# Patient Record
Sex: Female | Born: 1985 | Race: Black or African American | Hispanic: No | Marital: Single | Smoking: Never smoker
Health system: Southern US, Community
[De-identification: ages and names within clinical notes are randomized; demographics above are authoritative.]

## PROBLEM LIST (undated history)

## (undated) ENCOUNTER — Inpatient Hospital Stay (HOSPITAL_COMMUNITY): Payer: Self-pay

## (undated) ENCOUNTER — Inpatient Hospital Stay (HOSPITAL_COMMUNITY): Payer: Managed Care, Other (non HMO)

## (undated) DIAGNOSIS — A630 Anogenital (venereal) warts: Secondary | ICD-10-CM

## (undated) DIAGNOSIS — N920 Excessive and frequent menstruation with regular cycle: Secondary | ICD-10-CM

## (undated) DIAGNOSIS — N879 Dysplasia of cervix uteri, unspecified: Secondary | ICD-10-CM

## (undated) DIAGNOSIS — F329 Major depressive disorder, single episode, unspecified: Secondary | ICD-10-CM

## (undated) DIAGNOSIS — F410 Panic disorder [episodic paroxysmal anxiety] without agoraphobia: Secondary | ICD-10-CM

## (undated) DIAGNOSIS — F32A Depression, unspecified: Secondary | ICD-10-CM

## (undated) DIAGNOSIS — D649 Anemia, unspecified: Secondary | ICD-10-CM

## (undated) DIAGNOSIS — R87629 Unspecified abnormal cytological findings in specimens from vagina: Secondary | ICD-10-CM

## (undated) DIAGNOSIS — F419 Anxiety disorder, unspecified: Secondary | ICD-10-CM

## (undated) HISTORY — DX: Depression, unspecified: F32.A

## (undated) HISTORY — DX: Anxiety disorder, unspecified: F41.9

## (undated) HISTORY — PX: DENTAL SURGERY: SHX609

## (undated) HISTORY — DX: Unspecified abnormal cytological findings in specimens from vagina: R87.629

## (undated) HISTORY — DX: Excessive and frequent menstruation with regular cycle: N92.0

## (undated) HISTORY — PX: CERVICAL BIOPSY  W/ LOOP ELECTRODE EXCISION: SUR135

## (undated) HISTORY — DX: Major depressive disorder, single episode, unspecified: F32.9

## (undated) HISTORY — DX: Panic disorder (episodic paroxysmal anxiety): F41.0

## (undated) HISTORY — DX: Anogenital (venereal) warts: A63.0

## (undated) HISTORY — DX: Dysplasia of cervix uteri, unspecified: N87.9

---
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
## 2007-01-11 ENCOUNTER — Inpatient Hospital Stay (HOSPITAL_COMMUNITY): Admission: AD | Admit: 2007-01-11 | Discharge: 2007-01-11 | Payer: Self-pay | Admitting: Obstetrics & Gynecology

## 2007-03-02 ENCOUNTER — Inpatient Hospital Stay (HOSPITAL_COMMUNITY): Admission: AD | Admit: 2007-03-02 | Discharge: 2007-03-02 | Payer: Self-pay | Admitting: Obstetrics

## 2007-04-24 ENCOUNTER — Ambulatory Visit (HOSPITAL_COMMUNITY): Admission: RE | Admit: 2007-04-24 | Discharge: 2007-04-24 | Payer: Self-pay | Admitting: Obstetrics

## 2007-05-23 ENCOUNTER — Inpatient Hospital Stay (HOSPITAL_COMMUNITY): Admission: AD | Admit: 2007-05-23 | Discharge: 2007-05-23 | Payer: Self-pay | Admitting: Obstetrics & Gynecology

## 2007-05-24 ENCOUNTER — Inpatient Hospital Stay (HOSPITAL_COMMUNITY): Admission: AD | Admit: 2007-05-24 | Discharge: 2007-05-26 | Payer: Self-pay | Admitting: Obstetrics & Gynecology

## 2007-09-16 ENCOUNTER — Emergency Department (HOSPITAL_COMMUNITY): Admission: EM | Admit: 2007-09-16 | Discharge: 2007-09-16 | Payer: Self-pay | Admitting: Emergency Medicine

## 2007-10-10 ENCOUNTER — Emergency Department (HOSPITAL_COMMUNITY): Admission: EM | Admit: 2007-10-10 | Discharge: 2007-10-10 | Payer: Self-pay | Admitting: Emergency Medicine

## 2007-11-05 ENCOUNTER — Emergency Department (HOSPITAL_COMMUNITY): Admission: EM | Admit: 2007-11-05 | Discharge: 2007-11-05 | Payer: Self-pay | Admitting: Family Medicine

## 2007-11-18 ENCOUNTER — Emergency Department (HOSPITAL_COMMUNITY): Admission: EM | Admit: 2007-11-18 | Discharge: 2007-11-18 | Payer: Self-pay | Admitting: Emergency Medicine

## 2008-01-18 ENCOUNTER — Inpatient Hospital Stay (HOSPITAL_COMMUNITY): Admission: AD | Admit: 2008-01-18 | Discharge: 2008-01-18 | Payer: Self-pay | Admitting: Obstetrics

## 2008-11-15 ENCOUNTER — Emergency Department (HOSPITAL_COMMUNITY): Admission: EM | Admit: 2008-11-15 | Discharge: 2008-11-15 | Payer: Self-pay | Admitting: Emergency Medicine

## 2008-11-25 ENCOUNTER — Emergency Department (HOSPITAL_COMMUNITY): Admission: EM | Admit: 2008-11-25 | Discharge: 2008-11-25 | Payer: Self-pay | Admitting: Family Medicine

## 2009-02-14 ENCOUNTER — Inpatient Hospital Stay (HOSPITAL_COMMUNITY): Admission: AD | Admit: 2009-02-14 | Discharge: 2009-02-14 | Payer: Self-pay | Admitting: Obstetrics & Gynecology

## 2009-03-10 ENCOUNTER — Emergency Department (HOSPITAL_COMMUNITY): Admission: EM | Admit: 2009-03-10 | Discharge: 2009-03-10 | Payer: Self-pay | Admitting: Family Medicine

## 2009-03-17 ENCOUNTER — Inpatient Hospital Stay (HOSPITAL_COMMUNITY): Admission: AD | Admit: 2009-03-17 | Discharge: 2009-03-17 | Payer: Self-pay | Admitting: Obstetrics & Gynecology

## 2009-03-17 ENCOUNTER — Ambulatory Visit: Payer: Self-pay | Admitting: Advanced Practice Midwife

## 2009-04-22 ENCOUNTER — Emergency Department (HOSPITAL_COMMUNITY): Admission: EM | Admit: 2009-04-22 | Discharge: 2009-04-22 | Payer: Self-pay | Admitting: Family Medicine

## 2009-10-07 ENCOUNTER — Emergency Department (HOSPITAL_COMMUNITY): Admission: EM | Admit: 2009-10-07 | Discharge: 2009-10-07 | Payer: Self-pay | Admitting: Emergency Medicine

## 2010-10-05 ENCOUNTER — Emergency Department (HOSPITAL_COMMUNITY)
Admission: EM | Admit: 2010-10-05 | Discharge: 2010-10-05 | Payer: Self-pay | Source: Home / Self Care | Admitting: Family Medicine

## 2010-10-31 ENCOUNTER — Encounter: Payer: Self-pay | Admitting: Obstetrics

## 2010-11-17 ENCOUNTER — Inpatient Hospital Stay (HOSPITAL_COMMUNITY): Payer: Medicare HMO

## 2010-11-17 ENCOUNTER — Inpatient Hospital Stay (HOSPITAL_COMMUNITY)
Admission: AD | Admit: 2010-11-17 | Discharge: 2010-11-17 | Disposition: A | Discharge status: Home / Self Care | Payer: Medicare HMO | Source: Ambulatory Visit | Attending: Obstetrics & Gynecology | Admitting: Obstetrics & Gynecology

## 2010-11-17 DIAGNOSIS — O210 Mild hyperemesis gravidarum: Secondary | ICD-10-CM | POA: Insufficient documentation

## 2010-11-17 LAB — URINALYSIS, ROUTINE W REFLEX MICROSCOPIC
Specific Gravity, Urine: 1.025 (ref 1.005–1.030)
Urine Glucose, Fasting: NEGATIVE mg/dL
pH: 6 (ref 5.0–8.0)

## 2010-11-17 LAB — POCT PREGNANCY, URINE: Preg Test, Ur: POSITIVE

## 2010-12-18 ENCOUNTER — Inpatient Hospital Stay (HOSPITAL_COMMUNITY)
Admission: AD | Admit: 2010-12-18 | Discharge: 2010-12-18 | Disposition: A | Discharge status: Home / Self Care | Payer: No Typology Code available for payment source | Source: Ambulatory Visit | Attending: Obstetrics | Admitting: Obstetrics

## 2010-12-18 ENCOUNTER — Inpatient Hospital Stay (HOSPITAL_COMMUNITY): Payer: No Typology Code available for payment source

## 2010-12-18 DIAGNOSIS — R109 Unspecified abdominal pain: Secondary | ICD-10-CM | POA: Diagnosis not present

## 2010-12-18 DIAGNOSIS — O99891 Other specified diseases and conditions complicating pregnancy: Secondary | ICD-10-CM | POA: Insufficient documentation

## 2010-12-18 DIAGNOSIS — O9989 Other specified diseases and conditions complicating pregnancy, childbirth and the puerperium: Secondary | ICD-10-CM

## 2010-12-18 DIAGNOSIS — W010XXA Fall on same level from slipping, tripping and stumbling without subsequent striking against object, initial encounter: Secondary | ICD-10-CM | POA: Insufficient documentation

## 2011-01-17 LAB — GC/CHLAMYDIA PROBE AMP, GENITAL
Chlamydia, DNA Probe: NEGATIVE
GC Probe Amp, Genital: NEGATIVE

## 2011-01-17 LAB — CBC
HCT: 33.4 % — ABNORMAL LOW (ref 36.0–46.0)
Hemoglobin: 11.2 g/dL — ABNORMAL LOW (ref 12.0–15.0)
MCHC: 33.5 g/dL (ref 30.0–36.0)
MCV: 80.3 fL (ref 78.0–100.0)
Platelets: 309 10*3/uL (ref 150–400)
RBC: 4.15 MIL/uL (ref 3.87–5.11)
RDW: 17.1 % — ABNORMAL HIGH (ref 11.5–15.5)
WBC: 5.9 10*3/uL (ref 4.0–10.5)

## 2011-01-17 LAB — WET PREP, GENITAL
Clue Cells Wet Prep HPF POC: NONE SEEN
Trich, Wet Prep: NONE SEEN
Yeast Wet Prep HPF POC: NONE SEEN

## 2011-01-17 LAB — POCT PREGNANCY, URINE: Preg Test, Ur: NEGATIVE

## 2011-07-05 LAB — WET PREP, GENITAL

## 2011-07-05 LAB — URINALYSIS, ROUTINE W REFLEX MICROSCOPIC
Bilirubin Urine: NEGATIVE
Hgb urine dipstick: NEGATIVE
Nitrite: NEGATIVE
Protein, ur: NEGATIVE

## 2011-07-05 LAB — POCT PREGNANCY, URINE: Preg Test, Ur: NEGATIVE

## 2011-07-05 LAB — GC/CHLAMYDIA PROBE AMP, GENITAL: Chlamydia, DNA Probe: NEGATIVE

## 2011-07-15 LAB — URINALYSIS, ROUTINE W REFLEX MICROSCOPIC
Glucose, UA: NEGATIVE
Hgb urine dipstick: NEGATIVE
Ketones, ur: NEGATIVE
Nitrite: NEGATIVE
Protein, ur: NEGATIVE
Specific Gravity, Urine: 1.03

## 2011-07-22 LAB — CBC
Hemoglobin: 7.7 — CL
MCV: 75.8 — ABNORMAL LOW
RBC: 3.12 — ABNORMAL LOW
WBC: 13.4 — ABNORMAL HIGH

## 2011-07-22 LAB — RH IMMUNE GLOB WKUP(>/=20WKS)(NOT WOMEN'S HOSP): Fetal Screen: NEGATIVE

## 2011-07-25 LAB — CBC
MCHC: 31.9
RDW: 17.1 — ABNORMAL HIGH

## 2011-07-25 LAB — RPR: RPR Ser Ql: NONREACTIVE

## 2011-09-18 ENCOUNTER — Inpatient Hospital Stay (HOSPITAL_COMMUNITY): Payer: Managed Care, Other (non HMO)

## 2011-09-18 ENCOUNTER — Inpatient Hospital Stay (HOSPITAL_COMMUNITY)
Admission: AD | Admit: 2011-09-18 | Discharge: 2011-09-18 | Disposition: A | Discharge status: Home / Self Care | Payer: Managed Care, Other (non HMO) | Source: Ambulatory Visit | Attending: Obstetrics | Admitting: Obstetrics

## 2011-09-18 ENCOUNTER — Encounter (HOSPITAL_COMMUNITY): Payer: Self-pay

## 2011-09-18 DIAGNOSIS — O26899 Other specified pregnancy related conditions, unspecified trimester: Secondary | ICD-10-CM

## 2011-09-18 DIAGNOSIS — N949 Unspecified condition associated with female genital organs and menstrual cycle: Secondary | ICD-10-CM | POA: Diagnosis not present

## 2011-09-18 DIAGNOSIS — O99891 Other specified diseases and conditions complicating pregnancy: Secondary | ICD-10-CM | POA: Insufficient documentation

## 2011-09-18 DIAGNOSIS — R109 Unspecified abdominal pain: Secondary | ICD-10-CM | POA: Insufficient documentation

## 2011-09-18 LAB — URINALYSIS, ROUTINE W REFLEX MICROSCOPIC
Ketones, ur: NEGATIVE mg/dL
Nitrite: NEGATIVE
Protein, ur: NEGATIVE mg/dL
Urobilinogen, UA: 0.2 mg/dL (ref 0.0–1.0)

## 2011-09-18 LAB — URINE MICROSCOPIC-ADD ON
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
## 2011-10-19 LAB — OB RESULTS CONSOLE GBS: GBS: NEGATIVE

## 2011-10-21 ENCOUNTER — Inpatient Hospital Stay (HOSPITAL_COMMUNITY): Payer: Managed Care, Other (non HMO)

## 2011-10-21 ENCOUNTER — Encounter (HOSPITAL_COMMUNITY): Payer: Self-pay | Admitting: *Deleted

## 2011-10-21 ENCOUNTER — Inpatient Hospital Stay (HOSPITAL_COMMUNITY)
Admission: AD | Admit: 2011-10-21 | Discharge: 2011-10-21 | Disposition: A | Discharge status: Home / Self Care | Payer: Managed Care, Other (non HMO) | Source: Ambulatory Visit | Attending: Obstetrics | Admitting: Obstetrics

## 2011-10-21 DIAGNOSIS — Z1389 Encounter for screening for other disorder: Secondary | ICD-10-CM

## 2011-10-21 DIAGNOSIS — O26899 Other specified pregnancy related conditions, unspecified trimester: Secondary | ICD-10-CM

## 2011-10-21 DIAGNOSIS — O30009 Twin pregnancy, unspecified number of placenta and unspecified number of amniotic sacs, unspecified trimester: Secondary | ICD-10-CM

## 2011-10-21 DIAGNOSIS — Y92009 Unspecified place in unspecified non-institutional (private) residence as the place of occurrence of the external cause: Secondary | ICD-10-CM | POA: Insufficient documentation

## 2011-10-21 DIAGNOSIS — O99891 Other specified diseases and conditions complicating pregnancy: Secondary | ICD-10-CM | POA: Insufficient documentation

## 2011-10-21 DIAGNOSIS — Z3689 Encounter for other specified antenatal screening: Secondary | ICD-10-CM

## 2011-10-21 DIAGNOSIS — W108XXA Fall (on) (from) other stairs and steps, initial encounter: Secondary | ICD-10-CM | POA: Insufficient documentation

## 2011-10-21 DIAGNOSIS — R109 Unspecified abdominal pain: Secondary | ICD-10-CM

## 2011-10-21 NOTE — Progress Notes (Signed)
Pt in c/o lower abdominal pain, states its "throbbing".  Fell down 4 stairs and fell on behind about a hour ago.  Denies any bleeding

## 2011-10-21 NOTE — ED Provider Notes (Signed)
History   Pt presents today c/o falling. She states she was picking up some toys and fell down 4-5 stairs. She fell on her buttocks and denies direct trauma to the abd. She denies vag dc or bleeding. She does reports lower abd pain and "soreness."   Chief Complaint  Patient presents with  . Abdominal Pain   HPI  OB History    Grav Para Term Preterm Abortions TAB SAB Ect Mult Living   2 1        1       Past Medical History  Diagnosis Date  . No pertinent past medical history     Past Surgical History  Procedure Date  . Dental surgery     Family History  Problem Relation Age of Onset  . Cancer Father     History  Substance Use Topics  . Smoking status: Never Smoker   . Smokeless tobacco: Not on file  . Alcohol Use: No    Allergies: No Known Allergies  Prescriptions prior to admission  Medication Sig Dispense Refill  . ibuprofen (ADVIL,MOTRIN) 200 MG tablet Take 200 mg by mouth every 6 (six) hours as needed.          Review of Systems  Constitutional: Negative for fever.  Eyes: Negative for blurred vision and double vision.  Cardiovascular: Negative for chest pain.  Gastrointestinal: Positive for abdominal pain. Negative for nausea, vomiting, diarrhea and constipation.  Genitourinary: Negative for dysuria, urgency, frequency and hematuria.  Neurological: Negative for dizziness and headaches.  Psychiatric/Behavioral: Negative for depression and suicidal ideas.   Physical Exam   Blood pressure 115/69, pulse 91, temperature 98.5 F (36.9 C), temperature source Oral, resp. rate 18, height 5\' 4"  (1.626 m), weight 152 lb (68.947 kg), last menstrual period 06/11/2011.  Physical Exam  Nursing note and vitals reviewed. Constitutional: She is oriented to person, place, and time. She appears well-developed and well-nourished. No distress.  HENT:  Head: Normocephalic and atraumatic.  Eyes: EOM are normal. Pupils are equal, round, and reactive to light.  GI: Soft. She  exhibits no distension. There is no tenderness. There is no rebound and no guarding.  Neurological: She is alert and oriented to person, place, and time.  Skin: Skin is warm and dry. She is not diaphoretic.  Psychiatric: She has a normal mood and affect. Her behavior is normal. Judgment and thought content normal.    MAU Course  Procedures  US shows single IUP with NL anatomy and no evidence of previa or abruption. NL cervical length of 4cm.  Assessment and Plan  Maternal fall: discussed with pt at length. She has f/u scheduled. Discussed diet, activity, risks, and precautions.  Clinton Gallant. Renatta Shrieves III, DrHSc, MPAS, PA-C  10/21/2011, 3:50 PM   Henrietta Hoover, PA 10/21/11 1554

## 2012-01-09 ENCOUNTER — Encounter (HOSPITAL_COMMUNITY): Payer: Self-pay | Admitting: *Deleted

## 2012-01-09 ENCOUNTER — Inpatient Hospital Stay (HOSPITAL_COMMUNITY)
Admission: AD | Admit: 2012-01-09 | Discharge: 2012-01-10 | Disposition: A | Discharge status: Hospice | Payer: Managed Care, Other (non HMO) | Source: Ambulatory Visit | Attending: Obstetrics and Gynecology | Admitting: Obstetrics and Gynecology

## 2012-01-09 ENCOUNTER — Inpatient Hospital Stay (HOSPITAL_COMMUNITY): Payer: Managed Care, Other (non HMO)

## 2012-01-09 DIAGNOSIS — M549 Dorsalgia, unspecified: Secondary | ICD-10-CM

## 2012-01-09 DIAGNOSIS — O99891 Other specified diseases and conditions complicating pregnancy: Secondary | ICD-10-CM | POA: Insufficient documentation

## 2012-01-09 DIAGNOSIS — R109 Unspecified abdominal pain: Secondary | ICD-10-CM | POA: Insufficient documentation

## 2012-01-09 DIAGNOSIS — O26899 Other specified pregnancy related conditions, unspecified trimester: Secondary | ICD-10-CM

## 2012-01-09 LAB — URINE MICROSCOPIC-ADD ON

## 2012-01-09 LAB — URINALYSIS, ROUTINE W REFLEX MICROSCOPIC
Glucose, UA: NEGATIVE mg/dL
Ketones, ur: 15 mg/dL — AB
Nitrite: NEGATIVE
Protein, ur: NEGATIVE mg/dL

## 2012-01-09 LAB — WET PREP, GENITAL
Clue Cells Wet Prep HPF POC: NONE SEEN
Trich, Wet Prep: NONE SEEN

## 2012-01-09 NOTE — MAU Note (Signed)
Pt reports off/on today has been having cramping/contractions and back pain. Tried tylenol without relief. Denies bleeding but reports increased discharge.

## 2012-01-09 NOTE — MAU Provider Note (Signed)
Chief Complaint:  Abdominal Cramping   None     HPI  Morgan Quinn is  26 y.o. G2P1 at [redacted]w[redacted]d presents with cramping and back pain x2 days.  She reports that the pain feels like menstrual cramps and is happening every few minutes.  She reports good fetal movement, denies LOF, vaginal bleeding, vaginal itching/burning, n/v, h/a, dizziness, urinary symptoms, or fever/chills.     Pregnancy Course: uncomplicated  Past Medical History: Past Medical History  Diagnosis Date  . No pertinent past medical history     Past Surgical History: Past Surgical History  Procedure Date  . Dental surgery     Family History: Family History  Problem Relation Age of Onset  . Cancer Father     Social History: History  Substance Use Topics  . Smoking status: Never Smoker   . Smokeless tobacco: Not on file  . Alcohol Use: No    Allergies: No Known Allergies  Meds:  Prescriptions prior to admission  Medication Sig Dispense Refill  . acetaminophen (TYLENOL) 325 MG suppository Place 650 mg rectally every 4 (four) hours as needed. For pain.      . Prenatal Vit-Fe Fumarate-FA (PRENATAL MULTIVITAMIN) TABS Take 1 tablet by mouth every morning.         Physical Exam  Blood pressure 116/65, pulse 106, temperature 98.3 F (36.8 C), resp. rate 18, height 5\' 4"  (1.626 m), weight 73.936 kg (163 lb), last menstrual period 06/11/2011. GENERAL: Well-developed, well-nourished female in no acute distress.  ABDOMEN: Soft, nontender, nondistended, gravid.  EXTREMITIES: Nontender, no edema, 2+ distal pulses. Pelvic exam: Cervix pink, without lesion, moderate amount thick white discharge, vaginal walls and external genitalia normal FFN collected  Cervix 1/long/-3, soft, anterior  FHT:  Baseline 140 , moderate variability, accelerations present, no decelerations Contractions: None noted on Toco  Labs: Results for orders placed during the hospital encounter of 01/09/12 (from the past 24 hour(s))    URINALYSIS, ROUTINE W REFLEX MICROSCOPIC     Status: Abnormal   Collection Time   01/09/12  8:20 PM      Component Value Range   Color, Urine YELLOW  YELLOW    APPearance CLEAR  CLEAR    Specific Gravity, Urine 1.025  1.005 - 1.030    pH 6.5  5.0 - 8.0    Glucose, UA NEGATIVE  NEGATIVE (mg/dL)   Hgb urine dipstick NEGATIVE  NEGATIVE    Bilirubin Urine NEGATIVE  NEGATIVE    Ketones, ur 15 (*) NEGATIVE (mg/dL)   Protein, ur NEGATIVE  NEGATIVE (mg/dL)   Urobilinogen, UA 0.2  0.0 - 1.0 (mg/dL)   Nitrite NEGATIVE  NEGATIVE    Leukocytes, UA SMALL (*) NEGATIVE   URINE MICROSCOPIC-ADD ON     Status: Abnormal   Collection Time   01/09/12  8:20 PM      Component Value Range   Squamous Epithelial / LPF FEW (*) RARE    WBC, UA 7-10  <3 (WBC/hpf)   Bacteria, UA RARE  RARE    Urine-Other MUCOUS PRESENT    FETAL FIBRONECTIN     Status: Normal   Collection Time   01/09/12  9:00 PM      Component Value Range   Fetal Fibronectin NEGATIVE  NEGATIVE   WET PREP, GENITAL     Status: Abnormal   Collection Time   01/09/12  9:00 PM      Component Value Range   Yeast Wet Prep HPF POC RARE (*) NONE SEEN  Trich, Wet Prep NONE SEEN  NONE SEEN    Clue Cells Wet Prep HPF POC NONE SEEN  NONE SEEN    WBC, Wet Prep HPF POC TOO NUMEROUS TO COUNT (*) NONE SEEN    Imaging:    Assessment/Plan: Discussed results and assessment with Dr Arelia Sneddon Plan to do limited U/S for AFI and cervical length Call Dr Arelia Sneddon with results  Care assumed by Wynelle Bourgeois, CNM at 11:25pm.   LEFTWICH-KIRBY, LISA 4/1/20139:05 PM  Cervix rechecked by me:  1cm ext os/closed internal os/ long/ -3 US showed:     Normal AFI                          Cervical length 4.2  EFM shows no UCs, with occasional 20 second cramps Pt reports menstrual type pain Discussed with Dr Arelia Sneddon:  Will give one dose of Terb then d/c home.  After Terb, pt stated the pain was the same. I gave her one dose of Procardia, again without relief. She states  the pain is constant and is also in her lower back.   Discussed with Dr Arelia Sneddon:  With normal exam, no observed contractions, Neg FFN, and unchanged long cervix, will d/c home with followup in office.   Wynelle Bourgeois CNM, MSN [phone]

## 2012-01-10 LAB — URINE CULTURE
Colony Count: NO GROWTH
Culture  Setup Time: 201304020354
Culture: NO GROWTH

## 2012-01-10 MED ORDER — TERBUTALINE SULFATE 1 MG/ML IJ SOLN
INTRAMUSCULAR | Status: AC
  Administered 2012-01-10: 0.25 mg via SUBCUTANEOUS
  Filled 2012-01-10: qty 1

## 2012-01-10 MED ORDER — NIFEDIPINE 10 MG PO CAPS
10.0000 mg | ORAL_CAPSULE | Freq: Three times a day (TID) | ORAL | Status: DC
  Administered 2012-01-10: 10 mg via ORAL
  Filled 2012-01-10: qty 1

## 2012-01-10 MED ORDER — OXYCODONE-ACETAMINOPHEN 5-325 MG PO TABS
1.0000 | ORAL_TABLET | Freq: Once | ORAL | Status: AC
  Administered 2012-01-10: 1 via ORAL
  Filled 2012-01-10: qty 1

## 2012-01-10 MED ORDER — TERBUTALINE SULFATE 1 MG/ML IJ SOLN
0.2500 mg | Freq: Once | INTRAMUSCULAR | Status: AC
  Administered 2012-01-10: 0.25 mg via SUBCUTANEOUS

## 2012-01-10 NOTE — Discharge Instructions (Signed)
Abdominal Pain During Pregnancy °Belly (abdominal) pain is common during pregnancy. Most of the time, it is not a serious problem. Other times, it can be a sign that something is wrong with the pregnancy. Always tell your doctor if you have belly pain. °HOME CARE °For mild pain: °· Do not have sex (intercourse) or put anything in your vagina until you feel better.  °· Rest until your pain stops. If your pain lasts longer than 1 hour, call your doctor.  °· Drink clear fluids if you feel sick to your stomach (nauseous).  °· Do not eat solid food until you feel better.  °· Only take medicine as told by your doctor.  °· Keep all doctor visits as told.  °GET HELP RIGHT AWAY IF:  °· You are bleeding, leaking fluid, or pieces of tissue come out of your vagina.  °· You have more pain or cramping.  °· You keep throwing up (vomiting).  °· You have pain when you pee (urinate) or have blood in your pee.  °· You have a fever.  °· You do not feel your baby moving as much.  °· You feel very weak or feel like passing out.  °· You have trouble breathing, with or without belly pain.  °· You have a very bad headache and belly pain.  °· You have fluid leaking from your vagina and belly pain.  °· You keep having watery poop (diarrhea).  °· Your belly pain does not go away after resting, or the pain gets worse.  °MAKE SURE YOU:  °· Understand these instructions.  °· Will watch your condition.  °· Will get help right away if you are not doing well or get worse.  °Document Released: 09/14/2009 Document Revised: 09/15/2011 Document Reviewed: 04/22/2011 °[employer]® Patient Information ©2012 [employer]. °

## 2012-03-07 ENCOUNTER — Encounter (HOSPITAL_COMMUNITY): Payer: Self-pay | Admitting: *Deleted

## 2012-03-07 ENCOUNTER — Inpatient Hospital Stay (HOSPITAL_COMMUNITY): Payer: Managed Care, Other (non HMO) | Admitting: Anesthesiology

## 2012-03-07 ENCOUNTER — Inpatient Hospital Stay (HOSPITAL_COMMUNITY)
Admission: AD | Admit: 2012-03-07 | Discharge: 2012-03-09 | DRG: 775 | Disposition: A | Discharge status: Home / Self Care | Payer: Managed Care, Other (non HMO) | Source: Ambulatory Visit | Attending: Obstetrics and Gynecology | Admitting: Obstetrics and Gynecology

## 2012-03-07 ENCOUNTER — Encounter (HOSPITAL_COMMUNITY): Payer: Self-pay | Admitting: Anesthesiology

## 2012-03-07 LAB — CBC
HCT: 31 % — ABNORMAL LOW (ref 36.0–46.0)
MCV: 78.9 fL (ref 78.0–100.0)
RDW: 14.9 % (ref 11.5–15.5)
WBC: 7.6 10*3/uL (ref 4.0–10.5)

## 2012-03-07 MED ORDER — DIBUCAINE 1 % RE OINT: 1.0000 "application " | TOPICAL_OINTMENT | RECTAL | Status: DC | PRN

## 2012-03-07 MED ORDER — OXYCODONE-ACETAMINOPHEN 5-325 MG PO TABS: 1.0000 | ORAL_TABLET | ORAL | Status: DC | PRN

## 2012-03-07 MED ORDER — ACETAMINOPHEN 325 MG PO TABS: 650.0000 mg | ORAL_TABLET | ORAL | Status: DC | PRN

## 2012-03-07 MED ORDER — ONDANSETRON HCL 4 MG/2ML IJ SOLN
4.0000 mg | INTRAMUSCULAR | Status: DC | PRN
Start: 2012-03-07 — End: 2012-03-09

## 2012-03-07 MED ORDER — SENNOSIDES-DOCUSATE SODIUM 8.6-50 MG PO TABS
2.0000 | ORAL_TABLET | Freq: Every day | ORAL | Status: DC
  Administered 2012-03-08: 2 via ORAL

## 2012-03-07 MED ORDER — EPHEDRINE 5 MG/ML INJ: 10.0000 mg | INTRAVENOUS | Status: DC | PRN

## 2012-03-07 MED ORDER — LACTATED RINGERS IV SOLN
INTRAVENOUS | Status: DC
  Administered 2012-03-07: 16:00:00 via INTRAVENOUS
  Administered 2012-03-07: 400 mL via INTRAVENOUS
  Administered 2012-03-07: 300 mL via INTRAVENOUS
  Administered 2012-03-07: 14:00:00 via INTRAVENOUS

## 2012-03-07 MED ORDER — IBUPROFEN 600 MG PO TABS
600.0000 mg | ORAL_TABLET | Freq: Four times a day (QID) | ORAL | Status: DC
  Administered 2012-03-08 – 2012-03-09 (×8): 600 mg via ORAL
  Filled 2012-03-07 (×8): qty 1

## 2012-03-07 MED ORDER — TETANUS-DIPHTH-ACELL PERTUSSIS 5-2.5-18.5 LF-MCG/0.5 IM SUSP
0.5000 mL | Freq: Once | INTRAMUSCULAR | Status: AC
  Administered 2012-03-08: 0.5 mL via INTRAMUSCULAR
  Filled 2012-03-07: qty 0.5

## 2012-03-07 MED ORDER — CITRIC ACID-SODIUM CITRATE 334-500 MG/5ML PO SOLN: 30.0000 mL | ORAL | Status: DC | PRN

## 2012-03-07 MED ORDER — LACTATED RINGERS IV SOLN: 500.0000 mL | INTRAVENOUS | Status: DC | PRN

## 2012-03-07 MED ORDER — OXYTOCIN BOLUS FROM INFUSION
500.0000 mL | Freq: Once | INTRAVENOUS | Status: DC
  Filled 2012-03-07: qty 500

## 2012-03-07 MED ORDER — BENZOCAINE-MENTHOL 20-0.5 % EX AERO: 1.0000 "application " | INHALATION_SPRAY | CUTANEOUS | Status: DC | PRN

## 2012-03-07 MED ORDER — LIDOCAINE HCL (PF) 1 % IJ SOLN
INTRAMUSCULAR | Status: DC | PRN
  Administered 2012-03-07 (×2): 8 mL

## 2012-03-07 MED ORDER — FENTANYL 2.5 MCG/ML BUPIVACAINE 1/10 % EPIDURAL INFUSION (WH - ANES)
INTRAMUSCULAR | Status: DC | PRN
  Administered 2012-03-07: 12 mL/h via EPIDURAL

## 2012-03-07 MED ORDER — DIPHENHYDRAMINE HCL 50 MG/ML IJ SOLN: 12.5000 mg | INTRAMUSCULAR | Status: DC | PRN

## 2012-03-07 MED ORDER — TERBUTALINE SULFATE 1 MG/ML IJ SOLN: 0.2500 mg | Freq: Once | INTRAMUSCULAR | Status: DC | PRN

## 2012-03-07 MED ORDER — DIPHENHYDRAMINE HCL 25 MG PO CAPS: 25.0000 mg | ORAL_CAPSULE | Freq: Four times a day (QID) | ORAL | Status: DC | PRN

## 2012-03-07 MED ORDER — IBUPROFEN 600 MG PO TABS: 600.0000 mg | ORAL_TABLET | Freq: Four times a day (QID) | ORAL | Status: DC | PRN

## 2012-03-07 MED ORDER — ONDANSETRON HCL 4 MG PO TABS: 4.0000 mg | ORAL_TABLET | ORAL | Status: DC | PRN

## 2012-03-07 MED ORDER — WITCH HAZEL-GLYCERIN EX PADS: 1.0000 "application " | MEDICATED_PAD | CUTANEOUS | Status: DC | PRN

## 2012-03-07 MED ORDER — FENTANYL 2.5 MCG/ML BUPIVACAINE 1/10 % EPIDURAL INFUSION (WH - ANES)
14.0000 mL/h | INTRAMUSCULAR | Status: DC
  Administered 2012-03-07: 14 mL/h via EPIDURAL
  Filled 2012-03-07 (×2): qty 60

## 2012-03-07 MED ORDER — LIDOCAINE HCL (PF) 1 % IJ SOLN
30.0000 mL | INTRAMUSCULAR | Status: DC | PRN
  Filled 2012-03-07: qty 30

## 2012-03-07 MED ORDER — MEDROXYPROGESTERONE ACETATE 150 MG/ML IM SUSP: 150.0000 mg | INTRAMUSCULAR | Status: DC | PRN

## 2012-03-07 MED ORDER — LANOLIN HYDROUS EX OINT: TOPICAL_OINTMENT | CUTANEOUS | Status: DC | PRN

## 2012-03-07 MED ORDER — OXYCODONE-ACETAMINOPHEN 5-325 MG PO TABS
1.0000 | ORAL_TABLET | ORAL | Status: DC | PRN
  Administered 2012-03-08 (×4): 2 via ORAL
  Administered 2012-03-08 – 2012-03-09 (×4): 1 via ORAL
  Administered 2012-03-09: 2 via ORAL
  Administered 2012-03-09: 1 via ORAL
  Filled 2012-03-07: qty 1
  Filled 2012-03-07 (×2): qty 2
  Filled 2012-03-07 (×2): qty 1
  Filled 2012-03-07 (×4): qty 2

## 2012-03-07 MED ORDER — OXYTOCIN 20 UNITS IN LACTATED RINGERS INFUSION - SIMPLE
1.0000 m[IU]/min | INTRAVENOUS | Status: DC
  Administered 2012-03-07: 2 m[IU]/min via INTRAVENOUS
  Filled 2012-03-07: qty 1000

## 2012-03-07 MED ORDER — LACTATED RINGERS IV SOLN: 500.0000 mL | Freq: Once | INTRAVENOUS | Status: DC

## 2012-03-07 MED ORDER — EPHEDRINE 5 MG/ML INJ
10.0000 mg | INTRAVENOUS | Status: DC | PRN
  Filled 2012-03-07: qty 4

## 2012-03-07 MED ORDER — FLEET ENEMA 7-19 GM/118ML RE ENEM: 1.0000 | ENEMA | RECTAL | Status: DC | PRN

## 2012-03-07 MED ORDER — PHENYLEPHRINE 40 MCG/ML (10ML) SYRINGE FOR IV PUSH (FOR BLOOD PRESSURE SUPPORT)
80.0000 ug | PREFILLED_SYRINGE | INTRAVENOUS | Status: DC | PRN
  Administered 2012-03-07: 80 ug via INTRAVENOUS

## 2012-03-07 MED ORDER — ONDANSETRON HCL 4 MG/2ML IJ SOLN: 4.0000 mg | Freq: Four times a day (QID) | INTRAMUSCULAR | Status: DC | PRN

## 2012-03-07 MED ORDER — SIMETHICONE 80 MG PO CHEW: 80.0000 mg | CHEWABLE_TABLET | ORAL | Status: DC | PRN

## 2012-03-07 MED ORDER — PHENYLEPHRINE 40 MCG/ML (10ML) SYRINGE FOR IV PUSH (FOR BLOOD PRESSURE SUPPORT)
80.0000 ug | PREFILLED_SYRINGE | INTRAVENOUS | Status: DC | PRN
  Filled 2012-03-07: qty 5

## 2012-03-07 MED ORDER — PRENATAL MULTIVITAMIN CH
1.0000 | ORAL_TABLET | Freq: Every day | ORAL | Status: DC
  Administered 2012-03-08 – 2012-03-09 (×2): 1 via ORAL
  Filled 2012-03-07 (×2): qty 1

## 2012-03-07 MED ORDER — MEASLES, MUMPS & RUBELLA VAC ~~LOC~~ INJ
0.5000 mL | INJECTION | Freq: Once | SUBCUTANEOUS | Status: DC
  Filled 2012-03-07: qty 0.5

## 2012-03-07 MED ORDER — OXYTOCIN 20 UNITS IN LACTATED RINGERS INFUSION - SIMPLE
125.0000 mL/h | Freq: Once | INTRAVENOUS | Status: AC
  Administered 2012-03-07: 999 mL/h via INTRAVENOUS

## 2012-03-07 NOTE — H&P (Signed)
Subjective:  Morgan Quinn is a 26 y.o. G2 P1 female with EDC 03/17/12 at 38 and 4/[redacted] weeks gestation who is being admitted for labor management.  .  Patient reports contractions since yesterday but increased intensity/frequency today.   Fetal Movement: normal.     Objective:   Vital signs in last 24 hours: Temp:  [98 F (36.7 C)] 98 F (36.7 C) (05/29 1254) Pulse Rate:  [79] 79  (05/29 1254) Resp:  [20] 20  (05/29 1254) BP: (124)/(74) 124/74 mmHg (05/29 1254) Weight:  [75.751 kg (167 lb)] 75.751 kg (167 lb) (05/29 1248)   General:   alert and cooperative  Skin:   normal  HEENT:  PERRLA  Lungs:   clear to auscultation bilaterally  Heart:   regular rate and rhythm, S1, S2 normal, no murmur, click, rub or gallop  Breasts:     Abdomen:  gravid, NT  Pelvis:  Cervix: 3/90/-2  FHT:  150s  Uterine Size:   Presentations: cephalic  Cervix:    Dilation: 3cm   Effacement: 90   Station:  -2   Consistency: soft   Position: anterior   Lab Review:  GBS neg   Assessment/Plan:  Labor - admit, exp mngt, epidural prn

## 2012-03-07 NOTE — Progress Notes (Signed)
Pt comfortable w/ epidural.  FHT reassuring Toco Q2 Cvx 7/90/-1  A/P:  Exp mngt

## 2012-03-07 NOTE — Progress Notes (Signed)
Pt comfortable w/ epidural  FHT reassuring Toco Q5-7 Cvx 5/90/-2  AROM, scant fluid  A/P:  Exp mngt Augment prn

## 2012-03-07 NOTE — Progress Notes (Signed)
Infant back sign to skin with mom due to infant fussiness.  Standard of care with skin to skin, encouraging breastfeeding, delay in bath reinforced.  Pt states she really wants to rest and for the baby to go to the nursery.

## 2012-03-07 NOTE — Progress Notes (Signed)
Skin to skin, weight delay, and new standard of care explained to pt.

## 2012-03-07 NOTE — Progress Notes (Signed)
Pt up to bathroom.  Infant back skin to skin with father of baby.  Pt requesting baby to have bottle and pacifier.  Explained standard that these things are given on postpartum from the nursery.  Pt to bathroom.  Pt desires IV out and to shower.  Explained these things will occur on postpartum once she is stable on that unit and has voided twice.

## 2012-03-07 NOTE — Progress Notes (Signed)
Pt assisted with breastfeeding.  Infant latches well with assistance.  Importance of skin to skin reinforced.

## 2012-03-07 NOTE — Anesthesia Procedure Notes (Signed)
Epidural Patient location during procedure: OB Start time: 03/07/2012 2:50 PM End time: 03/07/2012 2:55 PM Reason for block: procedure for pain  Staffing Anesthesiologist: Sandrea Hughs Performed by: anesthesiologist   Preanesthetic Checklist Completed: patient identified, site marked, surgical consent, pre-op evaluation, timeout performed, IV checked, risks and benefits discussed and monitors and equipment checked  Epidural Patient position: sitting Prep: site prepped and draped and DuraPrep Patient monitoring: continuous pulse ox and blood pressure Approach: midline Injection technique: LOR air  Needle:  Needle type: Tuohy  Needle gauge: 17 G Needle length: 9 cm Needle insertion depth: 5 cm cm Catheter type: closed end flexible Catheter size: 19 Gauge Catheter at skin depth: 10 cm Test dose: negative and Other  Assessment Sensory level: T8 Events: blood not aspirated, injection not painful, no injection resistance, negative IV test and no paresthesia

## 2012-03-07 NOTE — Progress Notes (Signed)
Infant weighed and measurements completed.  Pt did not desire to continue with skin to skin.  Infant skin to skin with father of baby.

## 2012-03-07 NOTE — Progress Notes (Addendum)
SVD of vigerous female infant w/ apgars of 9,9.  Placenta delivered spontaneous w/ 3VC.   2nd degree lac repaired w/ 3-0 vicryl rapide.  Fundus firm.  EBL 350cc .  Mom and baby stable, skin/skin

## 2012-03-07 NOTE — Anesthesia Preprocedure Evaluation (Signed)

## 2012-03-07 NOTE — Progress Notes (Signed)
Please see delivery record
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
## 2012-03-07 NOTE — Progress Notes (Signed)
Pt still w/ rare ctx.  Comfortable w/ epidural.    FHT reassuring Toco Q5 Cvx 5cm  A/P:  Will augment w/ low dose pitocin

## 2012-03-08 ENCOUNTER — Encounter (HOSPITAL_COMMUNITY): Payer: Self-pay | Admitting: *Deleted

## 2012-03-08 LAB — CBC
HCT: 26.2 % — ABNORMAL LOW (ref 36.0–46.0)
Hemoglobin: 8.2 g/dL — ABNORMAL LOW (ref 12.0–15.0)
MCH: 24.8 pg — ABNORMAL LOW (ref 26.0–34.0)
MCV: 79.2 fL (ref 78.0–100.0)
RBC: 3.31 MIL/uL — ABNORMAL LOW (ref 3.87–5.11)

## 2012-03-08 MED ORDER — RHO D IMMUNE GLOBULIN 1500 UNIT/2ML IJ SOLN
300.0000 ug | Freq: Once | INTRAMUSCULAR | Status: AC
  Administered 2012-03-08: 300 ug via INTRAMUSCULAR
  Filled 2012-03-08: qty 2
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
## 2012-03-08 NOTE — Progress Notes (Signed)
Post Partum Day 1 Subjective: no complaints, up ad lib, voiding and tolerating PO  Objective: Blood pressure 101/61, pulse 62, temperature 97.9 F (36.6 C), temperature source Oral, resp. rate 18, height 5\' 4"  (1.626 m), weight 75.751 kg (167 lb), last menstrual period 06/11/2011.  Physical Exam:  General: alert and cooperative Lochia: appropriate Uterine Fundus: firm Incision: perineum intact DVT Evaluation: No evidence of DVT seen on physical exam.   Basename 03/08/12 0545 03/07/12 1341  HGB 8.2* 9.7*  HCT 26.2* 31.0*    Assessment/Plan: Plan for discharge tomorrow   LOS: 1 day   Latroya Ng G 03/08/2012, 7:51 AM

## 2012-03-08 NOTE — Plan of Care (Signed)
Problem: Discharge Progression Outcomes Goal: Barriers To Progression Addressed/Resolved Outcome: Progressing Social work consult done

## 2012-03-08 NOTE — Anesthesia Postprocedure Evaluation (Signed)
  Anesthesia Post-op Note  Patient: Morgan Quinn  Procedure(s) Performed: * No surgery found *  Patient Location: Mother/Baby  Anesthesia Type: Epidural  Level of Consciousness: awake  Airway and Oxygen Therapy: Patient Spontanous Breathing  Post-op Pain: none  Post-op Assessment: Patient's Cardiovascular Status Stable, Respiratory Function Stable, Patent Airway, No signs of Nausea or vomiting, Adequate PO intake, Pain level controlled, No headache, No backache, No residual numbness and No residual motor weakness  Post-op Vital Signs: Reviewed and stable  Complications: No apparent anesthesia complications

## 2012-03-09 LAB — RH IG WORKUP (INCLUDES ABO/RH)
Antibody Screen: POSITIVE
Fetal Screen: NEGATIVE

## 2012-03-09 MED ORDER — IBUPROFEN 600 MG PO TABS: 600.0000 mg | ORAL_TABLET | Freq: Four times a day (QID) | ORAL | Status: AC

## 2012-03-09 MED ORDER — OXYCODONE-ACETAMINOPHEN 5-325 MG PO TABS: 1.0000 | ORAL_TABLET | ORAL | Status: AC | PRN

## 2012-03-09 NOTE — Discharge Summary (Signed)
Obstetric Discharge Summary Reason for Admission: onset of labor Prenatal Procedures: ultrasound Intrapartum Procedures: spontaneous vaginal delivery Postpartum Procedures: none Complications-Operative and Postpartum: 2 degree perineal laceration Hemoglobin  Date Value Range Status  03/08/2012 8.2* 12.0-15.0 (g/dL) Final     HCT  Date Value Range Status  03/08/2012 26.2* 36.0-46.0 (%) Final    Physical Exam:  General: alert and cooperative Lochia: appropriate Uterine Fundus: firm Incision: perineum intact DVT Evaluation: No evidence of DVT seen on physical exam.  Discharge Diagnoses: Term Pregnancy-delivered  Discharge Information: Date: 03/09/2012 Activity: pelvic rest Diet: routine Medications: PNV, Ibuprofen and Percocet Condition: stable Instructions: refer to practice specific booklet Discharge to: home   Newborn Data: Live born female  Birth Weight: 5 lb 12.2 oz (2614 g) APGAR: 9, 9  Home with mother.  CURTIS,CAROL G 03/09/2012, 11:24 AM

## 2012-03-09 NOTE — Progress Notes (Signed)
Post Partum Day 2 Subjective: no complaints, up ad lib, voiding, tolerating PO, + flatus and desires discharge in am  Objective: Blood pressure 102/54, pulse 68, temperature 98.1 F (36.7 C), temperature source Oral, resp. rate 18, height 5\' 4"  (1.626 m), weight 75.751 kg (167 lb), last menstrual period 06/11/2011, unknown if currently breastfeeding.  Physical Exam:  General: alert and cooperative Lochia: appropriate Uterine Fundus: firm Incision: perineum intact DVT Evaluation: No evidence of DVT seen on physical exam.   Basename 03/08/12 0545 03/07/12 1341  HGB 8.2* 9.7*  HCT 26.2* 31.0*    Assessment/Plan: Plan for discharge tomorrow Ducalox supp this am   LOS: 2 days   CURTIS,CAROL G 03/09/2012, 8:00 AM

## 2013-02-23 ENCOUNTER — Encounter (HOSPITAL_COMMUNITY): Payer: Self-pay | Admitting: *Deleted

## 2013-02-23 ENCOUNTER — Emergency Department (HOSPITAL_COMMUNITY): Payer: Managed Care, Other (non HMO)

## 2013-02-23 ENCOUNTER — Emergency Department (HOSPITAL_COMMUNITY)
Admission: EM | Admit: 2013-02-23 | Discharge: 2013-02-23 | Disposition: A | Discharge status: Home / Self Care | Payer: Managed Care, Other (non HMO) | Attending: Emergency Medicine | Admitting: Emergency Medicine

## 2013-02-23 DIAGNOSIS — Z3201 Encounter for pregnancy test, result positive: Secondary | ICD-10-CM

## 2013-02-23 DIAGNOSIS — S46912A Strain of unspecified muscle, fascia and tendon at shoulder and upper arm level, left arm, initial encounter: Secondary | ICD-10-CM

## 2013-02-23 DIAGNOSIS — IMO0002 Reserved for concepts with insufficient information to code with codable children: Secondary | ICD-10-CM | POA: Insufficient documentation

## 2013-02-23 DIAGNOSIS — O9989 Other specified diseases and conditions complicating pregnancy, childbirth and the puerperium: Secondary | ICD-10-CM | POA: Insufficient documentation

## 2013-02-23 DIAGNOSIS — Y9389 Activity, other specified: Secondary | ICD-10-CM | POA: Insufficient documentation

## 2013-02-23 DIAGNOSIS — Y92410 Unspecified street and highway as the place of occurrence of the external cause: Secondary | ICD-10-CM | POA: Insufficient documentation

## 2013-02-23 MED ORDER — ACETAMINOPHEN 325 MG PO TABS
650.0000 mg | ORAL_TABLET | Freq: Once | ORAL | Status: DC
  Filled 2013-02-23: qty 1
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
## 2013-02-23 NOTE — ED Provider Notes (Signed)
History/physical exam/procedure(s) were performed by non-physician practitioner and as supervising physician I was immediately available for consultation/collaboration. I have reviewed all notes and am in agreement with care and plan.   Hilario Quarry, MD 02/23/13 [phone]
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
## 2013-03-25 ENCOUNTER — Encounter (HOSPITAL_COMMUNITY): Payer: Self-pay | Admitting: *Deleted

## 2013-03-25 ENCOUNTER — Inpatient Hospital Stay (HOSPITAL_COMMUNITY)
Admission: AD | Admit: 2013-03-25 | Discharge: 2013-03-25 | Disposition: A | Discharge status: Home / Self Care | Payer: Managed Care, Other (non HMO) | Source: Ambulatory Visit | Attending: Obstetrics and Gynecology | Admitting: Obstetrics and Gynecology

## 2013-03-25 DIAGNOSIS — O210 Mild hyperemesis gravidarum: Secondary | ICD-10-CM

## 2013-03-25 DIAGNOSIS — O219 Vomiting of pregnancy, unspecified: Secondary | ICD-10-CM

## 2013-03-25 DIAGNOSIS — R197 Diarrhea, unspecified: Secondary | ICD-10-CM | POA: Insufficient documentation

## 2013-03-25 LAB — URINALYSIS, ROUTINE W REFLEX MICROSCOPIC
Bilirubin Urine: NEGATIVE
Glucose, UA: NEGATIVE mg/dL
Hgb urine dipstick: NEGATIVE
Specific Gravity, Urine: 1.02 (ref 1.005–1.030)
Urobilinogen, UA: 0.2 mg/dL (ref 0.0–1.0)

## 2013-03-25 MED ORDER — LACTATED RINGERS IV BOLUS (SEPSIS)
1000.0000 mL | Freq: Once | INTRAVENOUS | Status: AC
  Administered 2013-03-25: 1000 mL via INTRAVENOUS

## 2013-03-25 MED ORDER — ONDANSETRON HCL 4 MG/2ML IJ SOLN
4.0000 mg | Freq: Once | INTRAMUSCULAR | Status: AC
  Administered 2013-03-25: 4 mg via INTRAVENOUS
  Filled 2013-03-25: qty 2

## 2013-03-25 MED ORDER — PROMETHAZINE HCL 25 MG/ML IJ SOLN
12.5000 mg | Freq: Once | INTRAMUSCULAR | Status: DC
  Filled 2013-03-25: qty 1

## 2013-03-25 MED ORDER — ACETAMINOPHEN 500 MG PO TABS
1000.0000 mg | ORAL_TABLET | Freq: Once | ORAL | Status: AC
  Administered 2013-03-25: 1000 mg via ORAL
  Filled 2013-03-25: qty 2

## 2013-03-25 NOTE — MAU Note (Signed)
Severe diarrhea, and vomiting- has been going on for a couple wks.  Was given a rx for zofran.  Feels really weak, tiring her out.  All day diarrhea is really tiring her out,  Was told to come in for fluids.

## 2013-03-25 NOTE — MAU Provider Note (Signed)
History     CSN: [id number]  Arrival date and time: 03/25/13 1133   First Provider Initiated Contact with Patient 03/25/13 1243      Chief Complaint  Patient presents with  . Morning Sickness   HPI Ms. Morgan Quinn is a 27 y.o. G3P2002 at [redacted]w[redacted]d who presents to MAU with N/V/D x 2-3 weeks. The patient states that she has approximately 4 loose BMs daily. She has had Zofran and feels that it helps somewhat but has not relieved her symptoms. She feels weak. She denies fever. She has occasional lower abdominal cramping, usually around the time of BM x 2 weeks. Had [place] for viability in the office last Thursday and per patient "everything was normal." Patient denies vaginal discharge or bleeding. She states no change in lower abdominal cramping x 2 weeks.    OB History   Grav Para Term Preterm Abortions TAB SAB Ect Mult Living   3 2 2  0 0 0 0 0 0 2      Past Medical History  Diagnosis Date  . No pertinent past medical history     Past Surgical History  Procedure Laterality Date  . Dental surgery      Family History  Problem Relation Age of Onset  . Cancer Father     History  Substance Use Topics  . Smoking status: Never Smoker   . Smokeless tobacco: Not on file  . Alcohol Use: No    Allergies: No Known Allergies  No prescriptions prior to admission    Review of Systems  Constitutional: Positive for malaise/fatigue. Negative for fever.  Gastrointestinal: Positive for nausea, vomiting, abdominal pain and diarrhea. Negative for constipation.  Genitourinary: Negative for dysuria, urgency and frequency.       Neg - vaginal bleeding, discharge  Neurological: Positive for dizziness, weakness and headaches. Negative for loss of consciousness.   Physical Exam   Blood pressure 116/59, pulse 94, temperature 98.5 F (36.9 C), temperature source Oral, resp. rate 18, weight 166 lb (75.297 kg), last menstrual period 01/24/2013.  Physical Exam  Constitutional: She is  oriented to person, place, and time. She appears well-developed and well-nourished. No distress.  HENT:  Head: Normocephalic and atraumatic.  Cardiovascular: Normal rate, regular rhythm and normal heart sounds.   Respiratory: Effort normal and breath sounds normal. No respiratory distress.  GI: Soft. Bowel sounds are normal. She exhibits no distension and no mass. There is tenderness (mild tenderness to palpation of the lower abdomen). There is no rebound and no guarding.  Neurological: She is alert and oriented to person, place, and time.  Skin: Skin is warm and dry. No erythema.  Psychiatric: She has a normal mood and affect.   Results for orders placed during the hospital encounter of 03/25/13 (from the past 24 hour(s))  URINALYSIS, ROUTINE W REFLEX MICROSCOPIC     Status: Abnormal   Collection Time    03/25/13 11:55 AM      Result Value Range   Color, Urine YELLOW  YELLOW   APPearance HAZY (*) CLEAR   Specific Gravity, Urine 1.020  1.005 - 1.030   pH 7.5  5.0 - 8.0   Glucose, UA NEGATIVE  NEGATIVE mg/dL   Hgb urine dipstick NEGATIVE  NEGATIVE   Bilirubin Urine NEGATIVE  NEGATIVE   Ketones, ur NEGATIVE  NEGATIVE mg/dL   Protein, ur NEGATIVE  NEGATIVE mg/dL   Urobilinogen, UA 0.2  0.0 - 1.0 mg/dL   Nitrite NEGATIVE  NEGATIVE   Leukocytes,  UA NEGATIVE  NEGATIVE     MAU Course  Procedures None  MDM Discussed with Dr. Rana Snare. 1 L IV LR with 12.5 phenergan.  Patient is driving and does not have anyone to pick her up. Will change order to Zofran 4 mg Patient reports improvement in symptoms Assessment and Plan  A: Nausea and vomiting in pregnancy prior to [redacted] weeks gestation  P: Discharge home Patient encourged to increase PO hydration as tolerated Patient advised to continue taking Zofran as needed Follow-up with Dr. Rana Snare as scheduled or sooner if symptoms worsen or persists Patient may return to MAU as needed or if her condition were to change or worsen  Freddi Starr,  PA-C  03/25/2013, 12:43 PM

## 2013-04-17 LAB — OB RESULTS CONSOLE RPR: RPR: NONREACTIVE

## 2013-04-17 LAB — OB RESULTS CONSOLE ABO/RH: RH Type: NEGATIVE

## 2013-04-17 LAB — OB RESULTS CONSOLE RUBELLA ANTIBODY, IGM: Rubella: IMMUNE

## 2013-04-17 LAB — OB RESULTS CONSOLE ANTIBODY SCREEN: ANTIBODY SCREEN: NEGATIVE

## 2013-04-17 LAB — OB RESULTS CONSOLE HEPATITIS B SURFACE ANTIGEN: HEP B S AG: NEGATIVE

## 2013-04-17 LAB — OB RESULTS CONSOLE HIV ANTIBODY (ROUTINE TESTING): HIV: NONREACTIVE

## 2013-04-17 LAB — OB RESULTS CONSOLE GC/CHLAMYDIA
CHLAMYDIA, DNA PROBE: NEGATIVE
Gonorrhea: NEGATIVE

## 2013-07-10 ENCOUNTER — Encounter (HOSPITAL_COMMUNITY): Payer: Self-pay

## 2013-07-10 ENCOUNTER — Inpatient Hospital Stay (HOSPITAL_COMMUNITY)
Admission: AD | Admit: 2013-07-10 | Discharge: 2013-07-10 | Disposition: A | Discharge status: Home / Self Care | Payer: Managed Care, Other (non HMO) | Source: Ambulatory Visit | Attending: Obstetrics and Gynecology | Admitting: Obstetrics and Gynecology

## 2013-07-10 DIAGNOSIS — O479 False labor, unspecified: Secondary | ICD-10-CM

## 2013-07-10 DIAGNOSIS — O4700 False labor before 37 completed weeks of gestation, unspecified trimester: Secondary | ICD-10-CM | POA: Insufficient documentation

## 2013-07-10 DIAGNOSIS — O4702 False labor before 37 completed weeks of gestation, second trimester: Secondary | ICD-10-CM

## 2013-07-10 LAB — URINALYSIS, ROUTINE W REFLEX MICROSCOPIC
Bilirubin Urine: NEGATIVE
Ketones, ur: 15 mg/dL — AB
Nitrite: NEGATIVE
Protein, ur: NEGATIVE mg/dL
Urobilinogen, UA: 0.2 mg/dL (ref 0.0–1.0)

## 2013-07-10 MED ORDER — GLYCOPYRROLATE 1 MG PO TABS: 1.0000 mg | ORAL_TABLET | Freq: Once | ORAL | Status: DC

## 2013-07-10 NOTE — MAU Note (Signed)
Patient is in with c/o painful lower abdominal tightening/contraction. She denies vaginal bleeding or lof. She reports good fetal movement

## 2013-07-10 NOTE — MAU Note (Signed)
Pt was in MD office. Having ctx. Sent from office to be evaluated.

## 2013-07-10 NOTE — MAU Provider Note (Signed)
History     CSN: [id number]  Arrival date and time: 07/10/13 4098   First Provider Initiated Contact with Patient 07/10/13 1829      Chief Complaint  Patient presents with  . Contractions   HPI This is a 27 y.o. female at [redacted]w[redacted]d who presents with c/o contractions for a week or so. States they were strong and painful at home, but have subsided since she got into the bed here. Denies leaking or bleeding Had some preterm contractions last pregnancy but delivered at term. States DR Rana Snare wants her to have a FFn.   RN Note: Patient is in with c/o painful lower abdominal tightening/contraction. She denies vaginal bleeding or lof. She reports good fetal movement      OB History   Grav Para Term Preterm Abortions TAB SAB Ect Mult Living   3 2 2  0 0 0 0 0 0 2      Past Medical History  Diagnosis Date  . No pertinent past medical history     Past Surgical History  Procedure Laterality Date  . Dental surgery      Family History  Problem Relation Age of Onset  . Cancer Father     History  Substance Use Topics  . Smoking status: Never Smoker   . Smokeless tobacco: Not on file  . Alcohol Use: No    Allergies: No Known Allergies  Prescriptions prior to admission  Medication Sig Dispense Refill  . acetaminophen (TYLENOL) 325 MG tablet Take 650 mg by mouth every 6 (six) hours as needed for pain (For headache.).      Marland Kitchen ondansetron (ZOFRAN-ODT) 8 MG disintegrating tablet Take 8 mg by mouth every 8 (eight) hours as needed for nausea.      . Prenatal Vit-Fe Fumarate-FA (PRENATAL MULTIVITAMIN) TABS tablet Take 1 tablet by mouth daily at 12 noon.        Review of Systems  Constitutional: Negative for fever and chills.  Gastrointestinal: Positive for abdominal pain (earlier, not now). Negative for nausea, vomiting, diarrhea and constipation.  Genitourinary: Negative for dysuria.  Neurological: Negative for dizziness.   Physical Exam   Blood pressure 124/60, pulse 87,  temperature 97.9 F (36.6 C), temperature source Oral, resp. rate 18, height 5\' 4"  (1.626 m), weight 78.835 kg (173 lb 12.8 oz), last menstrual period 01/24/2013, SpO2 99.00%.  Physical Exam  Constitutional: She is oriented to person, place, and time. She appears well-developed and well-nourished. No distress.  HENT:  Head: Normocephalic.  Cardiovascular: Normal rate.   Respiratory: Effort normal.  GI: Soft. There is no tenderness. There is no rebound and no guarding.  Genitourinary: Vagina normal and uterus normal. No vaginal discharge found.  Cervix long and closed   Musculoskeletal: Normal range of motion.  Neurological: She is alert and oriented to person, place, and time.  Skin: Skin is warm and dry.  Psychiatric: She has a normal mood and affect.   Fetal heart rate reassuring for gestational age No contractions seen or felt by pt  MAU Course  Procedures  MDM FFn sent.   >> FFn Negative No contractions seen or felt by pt  Assessment and Plan  A:  SIUP at [redacted]w[redacted]d       History of preterm contractions      Negative exam and Negative FFn  P:  Discharge per Dr Rana Snare      Followup in office (missed 9/29 appointment)      PTL precautions  [facility] 07/10/2013, 6:30 PM

## 2013-10-07 LAB — OB RESULTS CONSOLE GBS: STREP GROUP B AG: POSITIVE

## 2013-10-10 NOTE — L&D Delivery Note (Signed)
SVD of VMI at 2010 on 10/24/13.  EBL: 250cc.  Placenta to L&D.  APGARs 8,9. I arrived to the room as the head delivered in the LOA position.  Immediately following delivery of the head, I delivered the body atraumatically.  Mouth and nose were bulb suctioned.  Cord was clamped, cut and baby to abdomen.  Cord pH was obtained.  Placenta delivered S/I/3VC.  Fundus was firmed with pitocin and massage.  Perineum intact.  Mom and baby stable.

## 2013-10-22 ENCOUNTER — Encounter (HOSPITAL_COMMUNITY): Payer: Self-pay | Admitting: *Deleted

## 2013-10-22 ENCOUNTER — Telehealth (HOSPITAL_COMMUNITY): Payer: Self-pay | Admitting: *Deleted

## 2013-10-22 NOTE — Telephone Encounter (Signed)
Preadmission screen  

## 2013-10-24 ENCOUNTER — Encounter (HOSPITAL_COMMUNITY): Payer: Self-pay

## 2013-10-24 ENCOUNTER — Encounter (HOSPITAL_COMMUNITY): Payer: Managed Care, Other (non HMO) | Admitting: Anesthesiology

## 2013-10-24 ENCOUNTER — Inpatient Hospital Stay (HOSPITAL_COMMUNITY): Payer: Managed Care, Other (non HMO) | Admitting: Anesthesiology

## 2013-10-24 ENCOUNTER — Inpatient Hospital Stay (HOSPITAL_COMMUNITY)
Admission: RE | Admit: 2013-10-24 | Discharge: 2013-10-26 | DRG: 775 | Disposition: A | Discharge status: Home / Self Care | Payer: Managed Care, Other (non HMO) | Source: Ambulatory Visit | Attending: Obstetrics & Gynecology | Admitting: Obstetrics & Gynecology

## 2013-10-24 DIAGNOSIS — O9989 Other specified diseases and conditions complicating pregnancy, childbirth and the puerperium: Secondary | ICD-10-CM

## 2013-10-24 DIAGNOSIS — O360990 Maternal care for other rhesus isoimmunization, unspecified trimester, not applicable or unspecified: Principal | ICD-10-CM | POA: Diagnosis present

## 2013-10-24 DIAGNOSIS — Z22330 Carrier of Group B streptococcus: Secondary | ICD-10-CM

## 2013-10-24 DIAGNOSIS — O99892 Other specified diseases and conditions complicating childbirth: Secondary | ICD-10-CM | POA: Diagnosis present

## 2013-10-24 DIAGNOSIS — Z3490 Encounter for supervision of normal pregnancy, unspecified, unspecified trimester: Secondary | ICD-10-CM

## 2013-10-24 LAB — RPR: RPR Ser Ql: NONREACTIVE

## 2013-10-24 LAB — CBC
HCT: 29.6 % — ABNORMAL LOW (ref 36.0–46.0)
Hemoglobin: 9.4 g/dL — ABNORMAL LOW (ref 12.0–15.0)
MCH: 24.5 pg — ABNORMAL LOW (ref 26.0–34.0)
MCHC: 31.8 g/dL (ref 30.0–36.0)
MCV: 77.1 fL — AB (ref 78.0–100.0)
PLATELETS: 261 10*3/uL (ref 150–400)
RBC: 3.84 MIL/uL — ABNORMAL LOW (ref 3.87–5.11)
RDW: 15.3 % (ref 11.5–15.5)
WBC: 9.6 10*3/uL (ref 4.0–10.5)

## 2013-10-24 MED ORDER — LACTATED RINGERS IV SOLN
500.0000 mL | INTRAVENOUS | Status: DC | PRN
  Administered 2013-10-24 (×2): 1000 mL via INTRAVENOUS

## 2013-10-24 MED ORDER — LACTATED RINGERS IV SOLN
INTRAVENOUS | Status: DC
  Administered 2013-10-24 (×4): via INTRAVENOUS

## 2013-10-24 MED ORDER — SODIUM BICARBONATE 8.4 % IV SOLN
INTRAVENOUS | Status: DC | PRN
  Administered 2013-10-24 (×4): 4 mL via EPIDURAL

## 2013-10-24 MED ORDER — DEXTROSE 5 % IV SOLN
5.0000 10*6.[IU] | Freq: Once | INTRAVENOUS | Status: AC
  Administered 2013-10-24: 5 10*6.[IU] via INTRAVENOUS
  Filled 2013-10-24: qty 5

## 2013-10-24 MED ORDER — ZOLPIDEM TARTRATE 5 MG PO TABS
5.0000 mg | ORAL_TABLET | Freq: Every evening | ORAL | Status: DC | PRN
Start: 2013-10-24 — End: 2013-10-26

## 2013-10-24 MED ORDER — DIPHENHYDRAMINE HCL 25 MG PO CAPS
25.0000 mg | ORAL_CAPSULE | Freq: Four times a day (QID) | ORAL | Status: DC | PRN
Start: 2013-10-24 — End: 2013-10-26
  Administered 2013-10-25: 25 mg via ORAL
  Filled 2013-10-24: qty 1

## 2013-10-24 MED ORDER — ONDANSETRON HCL 4 MG PO TABS: 4.0000 mg | ORAL_TABLET | ORAL | Status: DC | PRN

## 2013-10-24 MED ORDER — IBUPROFEN 600 MG PO TABS
600.0000 mg | ORAL_TABLET | Freq: Four times a day (QID) | ORAL | Status: DC
  Administered 2013-10-25 – 2013-10-26 (×6): 600 mg via ORAL
  Filled 2013-10-24 (×7): qty 1

## 2013-10-24 MED ORDER — LIDOCAINE HCL (PF) 1 % IJ SOLN
30.0000 mL | INTRAMUSCULAR | Status: DC | PRN
  Filled 2013-10-24 (×2): qty 30

## 2013-10-24 MED ORDER — WITCH HAZEL-GLYCERIN EX PADS
1.0000 | MEDICATED_PAD | CUTANEOUS | Status: DC | PRN
Start: 2013-10-24 — End: 2013-10-26

## 2013-10-24 MED ORDER — EPHEDRINE 5 MG/ML INJ
10.0000 mg | INTRAVENOUS | Status: DC | PRN
  Filled 2013-10-24: qty 2
  Filled 2013-10-24 (×2): qty 4

## 2013-10-24 MED ORDER — FENTANYL 2.5 MCG/ML BUPIVACAINE 1/10 % EPIDURAL INFUSION (WH - ANES)
INTRAMUSCULAR | Status: DC | PRN
  Administered 2013-10-24: 14 mL/h via EPIDURAL

## 2013-10-24 MED ORDER — DIBUCAINE 1 % RE OINT: 1.0000 "application " | TOPICAL_OINTMENT | RECTAL | Status: DC | PRN

## 2013-10-24 MED ORDER — LANOLIN HYDROUS EX OINT: TOPICAL_OINTMENT | CUTANEOUS | Status: DC | PRN

## 2013-10-24 MED ORDER — IBUPROFEN 600 MG PO TABS
600.0000 mg | ORAL_TABLET | Freq: Four times a day (QID) | ORAL | Status: DC | PRN
  Administered 2013-10-24: 600 mg via ORAL
  Filled 2013-10-24: qty 1

## 2013-10-24 MED ORDER — PHENYLEPHRINE 40 MCG/ML (10ML) SYRINGE FOR IV PUSH (FOR BLOOD PRESSURE SUPPORT)
80.0000 ug | PREFILLED_SYRINGE | INTRAVENOUS | Status: DC | PRN
  Filled 2013-10-24: qty 2

## 2013-10-24 MED ORDER — PRENATAL MULTIVITAMIN CH
1.0000 | ORAL_TABLET | Freq: Every day | ORAL | Status: DC
  Administered 2013-10-25 – 2013-10-26 (×2): 1 via ORAL
  Filled 2013-10-24 (×2): qty 1

## 2013-10-24 MED ORDER — OXYTOCIN BOLUS FROM INFUSION
500.0000 mL | INTRAVENOUS | Status: DC
  Administered 2013-10-24: 500 mL via INTRAVENOUS

## 2013-10-24 MED ORDER — OXYTOCIN 40 UNITS IN LACTATED RINGERS INFUSION - SIMPLE MED: 62.5000 mL/h | INTRAVENOUS | Status: DC

## 2013-10-24 MED ORDER — TETANUS-DIPHTH-ACELL PERTUSSIS 5-2.5-18.5 LF-MCG/0.5 IM SUSP: 0.5000 mL | Freq: Once | INTRAMUSCULAR | Status: DC

## 2013-10-24 MED ORDER — DIPHENHYDRAMINE HCL 50 MG/ML IJ SOLN: 12.5000 mg | INTRAMUSCULAR | Status: DC | PRN

## 2013-10-24 MED ORDER — ONDANSETRON HCL 4 MG/2ML IJ SOLN: 4.0000 mg | INTRAMUSCULAR | Status: DC | PRN

## 2013-10-24 MED ORDER — EPHEDRINE 5 MG/ML INJ
10.0000 mg | INTRAVENOUS | Status: DC | PRN
  Administered 2013-10-24: 10 mg via INTRAVENOUS
  Filled 2013-10-24: qty 2

## 2013-10-24 MED ORDER — CITRIC ACID-SODIUM CITRATE 334-500 MG/5ML PO SOLN: 30.0000 mL | ORAL | Status: DC | PRN

## 2013-10-24 MED ORDER — TERBUTALINE SULFATE 1 MG/ML IJ SOLN: 0.2500 mg | Freq: Once | INTRAMUSCULAR | Status: DC | PRN

## 2013-10-24 MED ORDER — OXYCODONE-ACETAMINOPHEN 5-325 MG PO TABS: 1.0000 | ORAL_TABLET | ORAL | Status: DC | PRN

## 2013-10-24 MED ORDER — PHENYLEPHRINE 40 MCG/ML (10ML) SYRINGE FOR IV PUSH (FOR BLOOD PRESSURE SUPPORT)
80.0000 ug | PREFILLED_SYRINGE | INTRAVENOUS | Status: DC | PRN
  Filled 2013-10-24: qty 2
  Filled 2013-10-24: qty 10

## 2013-10-24 MED ORDER — LACTATED RINGERS IV SOLN
500.0000 mL | Freq: Once | INTRAVENOUS | Status: AC
  Administered 2013-10-24: 1000 mL via INTRAVENOUS

## 2013-10-24 MED ORDER — FLEET ENEMA 7-19 GM/118ML RE ENEM: 1.0000 | ENEMA | RECTAL | Status: DC | PRN

## 2013-10-24 MED ORDER — OXYTOCIN 40 UNITS IN LACTATED RINGERS INFUSION - SIMPLE MED
1.0000 m[IU]/min | INTRAVENOUS | Status: DC
  Administered 2013-10-24: 2 m[IU]/min via INTRAVENOUS
  Filled 2013-10-24: qty 1000

## 2013-10-24 MED ORDER — OXYCODONE-ACETAMINOPHEN 5-325 MG PO TABS
1.0000 | ORAL_TABLET | ORAL | Status: DC | PRN
  Administered 2013-10-24 – 2013-10-26 (×8): 2 via ORAL
  Administered 2013-10-26: 1 via ORAL
  Filled 2013-10-24 (×8): qty 2

## 2013-10-24 MED ORDER — SENNOSIDES-DOCUSATE SODIUM 8.6-50 MG PO TABS
2.0000 | ORAL_TABLET | ORAL | Status: DC
  Administered 2013-10-25 (×2): 2 via ORAL
  Filled 2013-10-24 (×2): qty 2

## 2013-10-24 MED ORDER — PENICILLIN G POTASSIUM 5000000 UNITS IJ SOLR
2.5000 10*6.[IU] | INTRAVENOUS | Status: DC
  Administered 2013-10-24 (×3): 2.5 10*6.[IU] via INTRAVENOUS
  Filled 2013-10-24 (×5): qty 2.5

## 2013-10-24 MED ORDER — TERBUTALINE SULFATE 1 MG/ML IJ SOLN: 0.2500 mg | Freq: Once | INTRAMUSCULAR | Status: AC | PRN

## 2013-10-24 MED ORDER — ACETAMINOPHEN 325 MG PO TABS: 650.0000 mg | ORAL_TABLET | ORAL | Status: DC | PRN

## 2013-10-24 MED ORDER — FENTANYL 2.5 MCG/ML BUPIVACAINE 1/10 % EPIDURAL INFUSION (WH - ANES)
14.0000 mL/h | INTRAMUSCULAR | Status: DC | PRN
  Filled 2013-10-24: qty 125

## 2013-10-24 MED ORDER — SIMETHICONE 80 MG PO CHEW: 80.0000 mg | CHEWABLE_TABLET | ORAL | Status: DC | PRN

## 2013-10-24 MED ORDER — ONDANSETRON HCL 4 MG/2ML IJ SOLN: 4.0000 mg | Freq: Four times a day (QID) | INTRAMUSCULAR | Status: DC | PRN

## 2013-10-24 MED ORDER — LIDOCAINE HCL (PF) 1 % IJ SOLN
INTRAMUSCULAR | Status: DC | PRN
  Administered 2013-10-24 (×2): 4 mL

## 2013-10-24 MED ORDER — BENZOCAINE-MENTHOL 20-0.5 % EX AERO: 1.0000 "application " | INHALATION_SPRAY | CUTANEOUS | Status: DC | PRN

## 2013-10-24 NOTE — Anesthesia Preprocedure Evaluation (Signed)
Anesthesia Evaluation  Patient identified by MRN, date of birth, ID band Patient awake    Reviewed: Allergy & Precautions, H&P , Patient's Chart, lab work & pertinent test results  Airway Mallampati: III TM Distance: >3 FB Neck ROM: full    Dental no notable dental hx. (+) Teeth Intact   Pulmonary neg pulmonary ROS,  breath sounds clear to auscultation  Pulmonary exam normal       Cardiovascular negative cardio ROS  Rhythm:regular Rate:Normal     Neuro/Psych PSYCHIATRIC DISORDERS Depression negative neurological ROS  negative psych ROS   GI/Hepatic negative GI ROS, Neg liver ROS,   Endo/Other  negative endocrine ROS  Renal/GU negative Renal ROS  negative genitourinary   Musculoskeletal negative musculoskeletal ROS (+)   Abdominal Normal abdominal exam  (+)   Peds  Hematology negative hematology ROS (+)   Anesthesia Other Findings   Reproductive/Obstetrics (+) Pregnancy                           Anesthesia Physical Anesthesia Plan  ASA: II  Anesthesia Plan: Epidural   Post-op Pain Management:    Induction:   Airway Management Planned: Natural Airway  Additional Equipment:   Intra-op Plan:   Post-operative Plan:   Informed Consent: I have reviewed the patients History and Physical, chart, labs and discussed the procedure including the risks, benefits and alternatives for the proposed anesthesia with the patient or authorized representative who has indicated his/her understanding and acceptance.     Plan Discussed with: Anesthesiologist  Anesthesia Plan Comments:         Anesthesia Quick Evaluation

## 2013-10-24 NOTE — H&P (Signed)
Morgan PassyJasmine C Pozzi is a 28 y.o. female presenting for IOL.  Antepartum course complicated by Rh negative, h/o postpartum depression and GBS positive.  Reports rare, mild CTX.  No LOF, VB.  +FM.  Maternal Medical History:  Contractions: Frequency: rare.    Fetal activity: Perceived fetal activity is normal.   Last perceived fetal movement was within the past hour.    Prenatal complications: no prenatal complications Prenatal Complications - Diabetes: none.    OB History   Grav Para Term Preterm Abortions TAB SAB Ect Mult Living   3 2 2  0 0 0 0 0 0 2     Past Medical History  Diagnosis Date  . No pertinent past medical history   . Depression     pp for 3 mos; was on medication   Past Surgical History  Procedure Laterality Date  . Dental surgery    . No past surgeries     Family History: family history includes Anemia in her mother; Cancer in her father; Diabetes in her paternal grandmother; Prader-Willi syndrome in her sister. Social History:  reports that she has never smoked. She has never used smokeless tobacco. She reports that she does not drink alcohol or use illicit drugs.   Prenatal Transfer Tool  Maternal Diabetes: No Genetic Screening: Normal Maternal Ultrasounds/Referrals: Normal Fetal Ultrasounds or other Referrals:  None Maternal Substance Abuse:  No Significant Maternal Medications:  None Significant Maternal Lab Results:  Lab values include: Group B Strep positive Other Comments:  None  ROS    Blood pressure 105/68, pulse 98, temperature 97.7 F (36.5 C), temperature source Oral, resp. rate 20, last menstrual period 01/24/2013. Maternal Exam:  Uterine Assessment: Contraction strength is mild.  Contraction frequency is rare.   Abdomen: Patient reports no abdominal tenderness. Fundal height is c/w dates.   Estimated fetal weight is 7,12.       Physical Exam  Constitutional: She is oriented to person, place, and time. She appears well-developed and  well-nourished.  GI: Soft. There is no rebound and no guarding.  Neurological: She is alert and oriented to person, place, and time.  Skin: Skin is warm and dry.  Psychiatric: She has a normal mood and affect. Her behavior is normal.    Prenatal labs: ABO, Rh: O/Negative/-- (07/09 0000) Antibody: Negative (07/09 0000) Rubella: Immune (07/09 0000) RPR: Nonreactive (07/09 0000)  HBsAg: Negative (07/09 0000)  HIV: Non-reactive (07/09 0000)  GBS: Positive (12/29 0000)   Assessment/Plan: 27yo [id number] at 39w for IOL -Will start pitocin and give one dose of PCN prior to AROM -GBS pos-PCN -Epidural when desired   Althea Backs 10/24/2013, 8:14 AM

## 2013-10-24 NOTE — Anesthesia Procedure Notes (Addendum)
Epidural Patient location during procedure: OB Start time: 10/24/2013 1:46 PM  Staffing Anesthesiologist: Angeletta Goelz A. Performed by: anesthesiologist   Preanesthetic Checklist Completed: patient identified, site marked, surgical consent, pre-op evaluation, timeout performed, IV checked, risks and benefits discussed and monitors and equipment checked  Epidural Patient position: sitting Prep: site prepped and draped and DuraPrep Patient monitoring: continuous pulse ox and blood pressure Approach: midline Injection technique: LOR air  Needle:  Needle type: Tuohy  Needle gauge: 17 G Needle length: 9 cm and 9 Needle insertion depth: 7 cm Catheter type: closed end flexible Catheter size: 19 Gauge Catheter at skin depth: 12 cm Test dose: negative and Other  Assessment Events: blood not aspirated, injection not painful, no injection resistance, negative IV test and no paresthesia  Additional Notes Patient identified. Risks and benefits discussed including failed block, incomplete  Pain control, post dural puncture headache, nerve damage, paralysis, blood pressure Changes, nausea, vomiting, reactions to medications-both toxic and allergic and post Partum back pain. All questions were answered. Patient expressed understanding and wished to proceed. Sterile technique was used throughout procedure. Epidural site was Dressed with sterile barrier dressing. No paresthesias, signs of intravascular injection Or signs of intrathecal spread were encountered.  Patient was more comfortable after the epidural was dosed. Please see RN's note for documentation of vital signs and FHR which are stable.   Epidural Patient location during procedure: OB Start time: 10/24/2013 3:41 PM  Staffing Anesthesiologist: Jarell Mcewen A. Performed by: anesthesiologist   Preanesthetic Checklist Completed: patient identified, site marked, surgical consent, pre-op evaluation, timeout performed, IV  checked, risks and benefits discussed and monitors and equipment checked  Epidural Patient position: sitting Prep: site prepped and draped and DuraPrep Patient monitoring: continuous pulse ox and blood pressure Approach: midline Injection technique: LOR air  Needle:  Needle type: Tuohy  Needle gauge: 17 G Needle length: 9 cm and 9 Needle insertion depth: 6 cm Catheter type: closed end flexible Catheter size: 19 Gauge Catheter at skin depth: 11 cm Test dose: negative and Other  Assessment Events: blood not aspirated, injection not painful, no injection resistance, negative IV test and no paresthesia  Additional Notes Patient identified. Risks and benefits discussed including failed block, incomplete  Pain control, post dural puncture headache, nerve damage, paralysis, blood pressure Changes, nausea, vomiting, reactions to medications-both toxic and allergic and post Partum back pain. All questions were answered. Patient expressed understanding and wished to proceed. Sterile technique was used throughout procedure. Epidural site was Dressed with sterile barrier dressing. No paresthesias, signs of intravascular injection Or signs of intrathecal spread were encountered.  Patient was more comfortable after the epidural was dosed. Please see RN's note for documentation of vital signs and FHR which are stable.

## 2013-10-24 NOTE — Progress Notes (Signed)
Delivery of live viable female at 2010 by Dr. Langston MaskerMorris.

## 2013-10-25 LAB — CBC
HCT: 30.1 % — ABNORMAL LOW (ref 36.0–46.0)
Hemoglobin: 9.4 g/dL — ABNORMAL LOW (ref 12.0–15.0)
MCH: 24.3 pg — ABNORMAL LOW (ref 26.0–34.0)
MCHC: 31.2 g/dL (ref 30.0–36.0)
MCV: 77.8 fL — AB (ref 78.0–100.0)
Platelets: 246 10*3/uL (ref 150–400)
RBC: 3.87 MIL/uL (ref 3.87–5.11)
RDW: 15.6 % — ABNORMAL HIGH (ref 11.5–15.5)
WBC: 11.8 10*3/uL — ABNORMAL HIGH (ref 4.0–10.5)

## 2013-10-25 NOTE — Anesthesia Postprocedure Evaluation (Signed)
  Anesthesia Post-op Note  Patient: Morgan Quinn  Procedure(s) Performed: * No procedures listed *  Patient Location: Mother/Baby  Anesthesia Type:Epidural  Level of Consciousness: awake and alert   Airway and Oxygen Therapy: Patient Spontanous Breathing  Post-op Pain: mild  Post-op Assessment: Patient's Cardiovascular Status Stable, Respiratory Function Stable, No signs of Nausea or vomiting, Adequate PO intake, Pain level controlled, No headache, No residual numbness and No residual motor weakness  Post-op Vital Signs: stable  Complications: No apparent anesthesia complications

## 2013-10-25 NOTE — Lactation Note (Signed)
This note was copied from the chart of Morgan Bobby RumpfJasmine Muckle. Lactation Consultation Note  Patient Name: Morgan Bobby RumpfJasmine Bumbaugh MVHQI'OToday's Date: 10/25/2013 Reason for consult: Initial assessment Mom feeding baby a bottle when entered the room. Mom states she only wants to bottle feed.  Maternal Data Formula Feeding for Exclusion: Yes Reason for exclusion: Mother's choice to formula and breast feed on admission (Mom decided to bottle feed exclusively, BR/BO her las child, but has to go back to work so soon, doesn't want to breast feed.)  Feeding    LATCH Score/Interventions                      Lactation Tools Discussed/Used     Consult Status      Geralynn OchsWILLIARD, Shaylynn Nulty 10/25/2013, 2:38 PM

## 2013-10-25 NOTE — Clinical Social Work Maternal (Signed)
Clinical Social Work Department  PSYCHOSOCIAL ASSESSMENT - MATERNAL/CHILD  10/25/2013  Patient: Morgan Morgan Quinn,Morgan Morgan Quinn Account Number: [id number] Admit Date: 10/24/2013  Childs Name:  Morgan Morgan Quinn   Clinical Social Worker: Ramzy Cappelletti, LCSW Date/Time: 10/25/2013 01:24 PM  Date Referred: 10/25/2013  Referral source   CN    Referred reason   Depression/Anxiety   Other referral source:  I: FAMILY / HOME ENVIRONMENT  Child's legal guardian: PARENT  Guardian - Name  Guardian - Age  Guardian - Address   Morgan Morgan Quinn  27  [address]; [place], [place] [address]   Morgan Morgan Quinn  27    Other household support members/support persons  Name  Relationship  DOB   Morgan Quinn  DAUGHTER  08/08   Morgan Morgan Quinn  SON  05/13   Other support:  Morgan Morgan Quinn- pt's mother   II PSYCHOSOCIAL DATA  Information Source: Patient Interview  Financial and Community Resources  Employment:  [employer]   Financial resources: Private Insurance  If Medicaid - County:  Other   Food Stamps   School / Grade:  Maternity Care Coordinator / Child Services Coordination / Early Interventions: Cultural issues impacting care:  III STRENGTHS  Strengths   Adequate Resources   Home prepared for Child (including basic supplies)   Supportive family/friends   Strength comment:  IV RISK FACTORS AND CURRENT PROBLEMS  Current Problem: YES  Risk Factor & Current Problem  Patient Issue  Family Issue  Risk Factor / Current Problem Comment   Mental Illness  Y  N  Hx of mild PP depression   V SOCIAL WORK ASSESSMENT  CSW referral made to assess pt's history of mild PP depression. Pt acknowledges that she experienced some PP depression symptoms after the birth of her son in 2013. She remember feeling depression, anxious & lack of interest in bonding with the baby. Pt remembers symptoms lasting for about 2-3 months before resolving. Her symptoms were treated with Zoloft & counseling (for 3 months), which was very helpful. She denies any history of SI  or HI. She denies any depression since then. CSW discussed PP depression signs/symptoms with pt & encouraged her to seek medical attention if needed. Pt appears to be doing well & denies any bonding issues at this time. She has all the necessary supplies for the infant & good support. FOB is involved & supportive, per pt. CSW will continue to follow & assist further if needed.   VI SOCIAL WORK PLAN  Social Work Plan   No Further Intervention Required / No Barriers to Discharge   Type of pt/family education:  If child protective services report - county:  If child protective services report - date:  Information/referral to community resources comment:  Other social work plan:     Clinical Social Work Department PSYCHOSOCIAL ASSESSMENT - MATERNAL/CHILD 10/25/2013  Patient:  Morgan Morgan Quinn, Morgan Morgan Quinn  Account Number:  [id number]  Admit Date:  10/24/2013  Marjo Bicker Name:   Morgan Morgan Quinn    Clinical Social Worker:  Nobie Putnam, LCSW   Date/Time:  10/25/2013 01:24 PM  Date Referred:  10/25/2013   Referral source  CN     Referred reason  Depression/Anxiety   Other referral source:    I:  FAMILY / HOME ENVIRONMENT Child's legal guardian:  PARENT  Guardian - Name Guardian - Age Guardian - Address  Morgan Morgan Quinn [address] [place], [place] [address]  Morgan Morgan Quinn 27    Other household support members/support persons Name Relationship DOB  Morgan Morgan Quinn 08/08  Morgan Morgan Quinn 05/13   Other support:   Morgan Morgan Quinn- pt's mother    II  PSYCHOSOCIAL DATA Information Source:  Patient Interview  Financial and Community Resources Employment:   Nurse, children's resources:  Media planner If [employer] - [place]:   Other  Chemical engineer / Grade:   Maternity Care Coordinator / Child Services Coordination / Early Interventions:  Cultural issues impacting care:    III  STRENGTHS Strengths  Adequate Resources  Home prepared for Child (including basic supplies)  Supportive family/friends   Strength comment:    IV  RISK FACTORS AND CURRENT PROBLEMS Current Problem:  YES   Risk Factor & Current Problem Patient Issue Family Issue Risk Factor / Current Problem Comment  Mental Illness Y N Hx of mild PP depression    V  SOCIAL WORK ASSESSMENT CSW referral made to assess pt's history of mild PP depression.  Pt acknowledges that she experienced some PP depression symptoms after the birth of her son in 2013. She remember feeling depression, anxious & lack of interest in bonding with the baby.  Pt remembers symptoms lasting for about 2-3 months before resolving.  Her symptoms were treated with Zoloft & counseling (for 3 months), which was very helpful.   She denies any history of SI or HI.  She denies any depression since then.  CSW discussed PP depression signs/symptoms with pt & encouraged her to seek medical attention if needed.  Pt appears to be doing well & denies any bonding issues at this time.  She has all the necessary supplies for the infant & good support.  FOB is involved & supportive, per pt.  CSW will continue to follow & assist further if needed.      VI SOCIAL WORK PLAN Social Work Plan  No Further Intervention Required / No Barriers to Discharge   Type of pt/family education:   If child protective services report - county:   If child protective services report - date:   Information/referral to community resources comment:   Other social work plan:

## 2013-10-25 NOTE — Lactation Note (Signed)
This note was copied from the chart of Morgan Quinn. Lactation Consultation Note  Patient Name: Morgan Quinn WUJWJ'XToday's Date: 10/25/2013 Reason for consult: Initial assessment Returned to offer assistance with latching baby onto breast and to see if mom had hand pumped for comfort. Mom said that she wants to wait until she is at home. Enc her to pump or nurse soon. Gave mom another #20 nipple shield and a #24 for comfort. Reviewed engorgement prevention/treatment and referred mom to baby and me booklet. Offered to make mom and baby an outpatient appointment, mom refused. Enc to call for assistance and [facility]  Maternal Data Formula Feeding for Exclusion: Yes Reason for exclusion: Mother's choice to formula and breast feed on admission (Mom decided to bottle feed exclusively, BR/BO her las child, but has to go back to work so soon, doesn't want to breast feed.)  Feeding    LATCH Score/Interventions                      Lactation Tools Discussed/Used     Consult Status Consult Status: Complete    Geralynn OchsWILLIARD, Duvan Mousel 10/25/2013, 2:53 PM

## 2013-10-25 NOTE — Progress Notes (Signed)
Post Partum Day 1 Subjective: up ad lib, voiding, tolerating PO and complains of perineal burning with voiding  Objective: Blood pressure 108/67, pulse 76, temperature 97.2 F (36.2 C), temperature source Axillary, resp. rate 18, height 5\' 4"  (1.626 m), weight 183 lb (83.008 kg), last menstrual period 01/24/2013, SpO2 98.00%, unknown if currently breastfeeding.  Physical Exam:  General: alert and cooperative Lochia: appropriate Uterine Fundus: firm Incision: perineum intact, no open laceration noted DVT Evaluation: No evidence of DVT seen on physical exam. Negative Homan's sign. No cords or calf tenderness. No significant calf/ankle edema.   Recent Labs  10/24/13 0815 10/25/13 0550  HGB 9.4* 9.4*  HCT 29.6* 30.1*    Assessment/Plan: Plan for discharge tomorrow  dermaplast spray and sitz bath   LOS: 1 day   Naiah Donahoe G 10/25/2013, 8:17 AM

## 2013-10-26 MED ORDER — OXYCODONE-ACETAMINOPHEN 7.5-325 MG PO TABS: 1.0000 | ORAL_TABLET | ORAL | Status: DC | PRN

## 2013-10-26 NOTE — Discharge Summary (Signed)
Obstetric Discharge Summary Reason for Admission: induction of labor Prenatal Procedures: none Intrapartum Procedures: spontaneous vaginal delivery Postpartum Procedures: none Complications-Operative and Postpartum: none Hemoglobin  Date Value Range Status  10/25/2013 9.4* 12.0 - 15.0 g/dL Final     HCT  Date Value Range Status  10/25/2013 30.1* 36.0 - 46.0 % Final    Physical Exam:  General: alert Lochia: appropriate Uterine Fundus: firm Incision: na DVT Evaluation: No evidence of DVT seen on physical exam.  Discharge Diagnoses: Term Pregnancy-delivered  Discharge Information: Date: 10/26/2013 Activity: pelvic rest Diet: routine Medications: PNV and Percocet Condition: stable Instructions: refer to practice specific booklet Discharge to: home   Newborn Data: Live born female  Birth Weight: 7 lb 10 oz (3459 g) APGAR: 8, 9  Home with mother.  Kenita Bines S 10/26/2013, 8:07 AM

## 2013-12-31 ENCOUNTER — Other Ambulatory Visit: Payer: Self-pay | Admitting: Obstetrics and Gynecology

## 2014-03-30 ENCOUNTER — Emergency Department (HOSPITAL_COMMUNITY)
Admission: EM | Admit: 2014-03-30 | Discharge: 2014-03-30 | Disposition: A | Discharge status: Home / Self Care | Payer: Managed Care, Other (non HMO) | Attending: Emergency Medicine | Admitting: Emergency Medicine

## 2014-03-30 ENCOUNTER — Encounter (HOSPITAL_COMMUNITY): Payer: Self-pay | Admitting: Emergency Medicine

## 2014-03-30 DIAGNOSIS — F3289 Other specified depressive episodes: Secondary | ICD-10-CM | POA: Insufficient documentation

## 2014-03-30 DIAGNOSIS — F329 Major depressive disorder, single episode, unspecified: Secondary | ICD-10-CM | POA: Insufficient documentation

## 2014-03-30 DIAGNOSIS — L42 Pityriasis rosea: Secondary | ICD-10-CM

## 2014-03-30 MED ORDER — DIPHENHYDRAMINE HCL 25 MG PO TABS: 25.0000 mg | ORAL_TABLET | Freq: Four times a day (QID) | ORAL | Status: DC

## 2014-03-30 NOTE — ED Provider Notes (Signed)
CSN: [id number]     Arrival date & time 03/30/14  1928 History  This chart was scribed for non-physician practitioner, Fayrene HelperBowie Tran, PA-C working with Dagmar HaitWilliam Blair Walden, MD by Greggory StallionKayla Andersen, ED scribe. This patient was seen in room WTR8/WTR8 and the patient's care was started at 7:49 PM.   Chief Complaint  Patient presents with  . Rash    generalized to body   The history is provided by the patient. No language interpreter was used.   HPI Comments: Morgan PassyJasmine C Quinn is a 28 y.o. female who presents to the Emergency Department complaining of a worsening, itchy rash that started 3 days ago. Pt states it started on her neck and has spread to her arms, back, flanks and legs. She states someone who had ringworm slept on her couch and her rash started 1-2 days later. Denies new soaps, detergents, lotions, pets, medications. Denies tongue swelling, trouble swallowing, difficulty breathing, SOB. Denies recent illness.   Past Medical History  Diagnosis Date  . No pertinent past medical history   . Depression     pp for 3 mos; was on medication   Past Surgical History  Procedure Laterality Date  . Dental surgery  July 2014, September 2013    wisdom tooth extraction; other oral surgery  . No past surgeries     Family History  Problem Relation Age of Onset  . Cancer Father   . Anemia Mother   . Prader-Willi syndrome Sister   . Diabetes Paternal Grandmother    History  Substance Use Topics  . Smoking status: Never Smoker   . Smokeless tobacco: Never Used  . Alcohol Use: No   OB History   Grav Para Term Preterm Abortions TAB SAB Ect Mult Living   3 3 3  0 0 0 0 0 0 3     Review of Systems  HENT: Negative for trouble swallowing.   Respiratory: Negative for shortness of breath.   Skin: Positive for rash.  All other systems reviewed and are negative.  Allergies  Review of patient's allergies indicates no known allergies.  Home Medications   Prior to Admission medications    Medication Sig Start Date End Date Taking? Authorizing Latessa Tillis  oxyCODONE-acetaminophen (PERCOCET) 7.5-325 MG per tablet Take 1 tablet by mouth every 4 (four) hours as needed for pain. 10/26/13   Juluis MireJohn S McComb, MD   BP 133/83  Pulse 56  Temp(Src) 98.2 F (36.8 C) (Oral)  Resp 18  Ht 5\' 4"  (1.626 m)  Wt 179 lb 0.2 oz (81.2 kg)  BMI 30.71 kg/m2  SpO2 100%  LMP 03/29/2014  Physical Exam  Nursing note and vitals reviewed. Constitutional: She is oriented to person, place, and time. She appears well-developed and well-nourished. No distress.  HENT:  Head: Normocephalic and atraumatic.  Mouth/Throat: Oropharynx is clear and moist.  No rash in mouth.   Eyes: Conjunctivae and EOM are normal.  Neck: Neck supple. No tracheal deviation present.  Cardiovascular: Normal rate, regular rhythm and normal heart sounds.   Pulmonary/Chest: Effort normal and breath sounds normal. No respiratory distress. She has no wheezes. She has no rales.  Musculoskeletal: Normal range of motion.  Neurological: She is alert and oriented to person, place, and time.  Skin: Skin is warm and dry.  Pt has multiple ovoid hyperpigmented lesion with skin flakes and central clearing in a christmas tree pattern noted on her chest, trunk, back and neck. No petechiae. No pustules. No vesicular lesions. Does have a herald  patch noted to left anterior upper chest with excoriation marks. No lesions in palms of hands or soles of feet.   Psychiatric: She has a normal mood and affect. Her behavior is normal.    ED Course  Procedures (including critical care time)  DIAGNOSTIC STUDIES: Oxygen Saturation is 100% on RA, normal by my interpretation.    COORDINATION OF CARE: 7:54 PM-Rash consistent with pityriasis rosea.  Discussed treatment plan which includes hydrocortisone cream and benadryl with pt at bedside and pt agreed to plan. Will give pt dermatology referral and advised her to follow up if symptoms do not resolve.   Labs  Review Labs Reviewed - No data to display  Imaging Review No results found.   EKG Interpretation None      MDM   Final diagnoses:  Pityriasis rosea    BP 133/83  Pulse 56  Temp(Src) 98.2 F (36.8 C) (Oral)  Resp 18  Ht 5\' 4"  (1.626 m)  Wt 179 lb 0.2 oz (81.2 kg)  BMI 30.71 kg/m2  SpO2 100%  LMP 03/29/2014   I personally performed the services described in this documentation, which was scribed in my presence. The recorded information has been reviewed and is accurate.  Fayrene HelperBowie Tran, PA-C 03/30/14 2005

## 2014-03-30 NOTE — ED Provider Notes (Signed)
Medical screening examination/treatment/procedure(s) were performed by non-physician practitioner and as supervising physician I was immediately available for consultation/collaboration.   EKG Interpretation None        William Blair Walden, MD 03/30/14 2319 

## 2014-03-30 NOTE — ED Notes (Addendum)
Patient reports generalized rash, started on her neck and has since spread to her body. Patient states someone slept on her couch and a day or two later the rash started. Patient c/o severe itching. No meds taken PTA for itching.

## 2014-03-30 NOTE — Discharge Instructions (Signed)
Pityriasis Rosea  Pityriasis rosea is a rash which is probably caused by a virus. It generally starts as a scaly, red patch on the trunk (the area of the body that a t-shirt would cover) but does not appear on sun exposed areas. The rash is usually preceded by an initial larger spot called the "herald patch" a week or more before the rest of the rash appears. Generally within one to two days the rash appears rapidly on the trunk, upper arms, and sometimes the upper legs. The rash usually appears as flat, oval patches of scaly pink color. The rash can also be raised and one is able to feel it with a finger. The rash can also be finely crinkled and may slough off leaving a ring of scale around the spot. Sometimes a mild sore throat is present with the rash. It usually affects children and young adults in the spring and autumn. Women are more frequently affected than men.  TREATMENT   Pityriasis rosea is a self-limited condition. This means it goes away within 4 to 8 weeks without treatment. The spots may persist for several months, especially in darker-colored skin after the rash has resolved and healed. Benadryl and steroid creams may be used if itching is a problem.  SEEK MEDICAL CARE IF:   · Your rash does not go away or persists longer than three months.  · You develop fever and joint pain.  · You develop severe headache and confusion.  · You develop breathing difficulty, vomiting and/or extreme weakness.  Document Released: 11/02/2001 Document Revised: 12/19/2011 Document Reviewed: 11/21/2008  ExitCare® Patient Information ©2015 [employer], LLC. This information is not intended to replace advice given to you by your health care provider. Make sure you discuss any questions you have with your health care provider.

## 2014-03-30 NOTE — ED Notes (Signed)
Patient is alert and oriented x3.  She was given DC instructions and follow up visit instructions.  Patient gave verbal understanding. She was DC ambulatory under her own power to home.  V/S stable.  He was not showing any signs of distress on DC 

## 2014-04-03 ENCOUNTER — Encounter (HOSPITAL_COMMUNITY): Payer: Self-pay | Admitting: Emergency Medicine

## 2014-04-03 ENCOUNTER — Emergency Department (INDEPENDENT_AMBULATORY_CARE_PROVIDER_SITE_OTHER)
Admission: EM | Admit: 2014-04-03 | Discharge: 2014-04-03 | Disposition: A | Discharge status: Home / Self Care | Payer: Medicaid Other | Source: Home / Self Care

## 2014-04-03 DIAGNOSIS — B354 Tinea corporis: Secondary | ICD-10-CM | POA: Diagnosis not present

## 2014-04-03 DIAGNOSIS — T148 Other injury of unspecified body region: Secondary | ICD-10-CM | POA: Diagnosis not present

## 2014-04-03 DIAGNOSIS — W57XXXA Bitten or stung by nonvenomous insect and other nonvenomous arthropods, initial encounter: Secondary | ICD-10-CM

## 2014-04-03 MED ORDER — PERMETHRIN 5 % EX CREA: TOPICAL_CREAM | CUTANEOUS | Status: DC

## 2014-04-03 MED ORDER — TRIAMCINOLONE ACETONIDE 0.1 % EX CREA: 1.0000 "application " | TOPICAL_CREAM | Freq: Two times a day (BID) | CUTANEOUS | Status: DC

## 2014-04-03 NOTE — Discharge Instructions (Signed)
Bedbugs Benadryl or claritin for itching Bedbugs are tiny bugs that live in and around beds. During the day, they hide in mattresses and other places near beds. They come out at night and bite people lying in bed. They need blood to live and grow. Bedbugs can be found in beds anywhere. Usually, they are found in places where many people come and go (hotels, shelters, hospitals). It does not matter whether the place is dirty or clean. Getting bitten by bedbugs rarely causes a medical problem. The biggest problem can be getting rid of them. This often takes the work of a Oncologist. CAUSES  Less use of pesticides. Bedbugs were common before the 1950s. Then, strong pesticides such as DDT nearly wiped them out. Today, these pesticides are not used because they harm the environment and can cause health problems.  More travel. Besides mattresses, bedbugs can also live in clothing and luggage. They can come along as people travel from place to place. Bedbugs are more common in certain parts of the world. When people travel to those areas, the bugs can come home with them.  Presence of birds and bats. Bedbugs often infest birds and bats. If you have these animals in or near your home, bedbugs may infest your house, too. SYMPTOMS It does not hurt to be bitten by a bedbug. You will probably not wake up when you are bitten. Bedbugs usually bite areas of the skin that are not covered. Symptoms may show when you wake up, or they may take a day or more to show up. Symptoms may include:  Small red bumps on the skin. These might be lined up in a row or clustered in a group.  A darker red dot in the middle of red bumps.  Blisters on the skin. There may be swelling and very bad itching. These may be signs of an allergic reaction. This does not happen often. DIAGNOSIS Bedbug bites might look and feel like other types of insect bites. The bugs do not stay on the body like ticks or lice. They bite, drop  off, and crawl away to hide. Your caregiver will probably:  Ask about your symptoms.  Ask about your recent activities and travel.  Check your skin for bedbug bites.  Ask you to check at home for signs of bedbugs. You should look for:  Spots or stains on the bed or nearby. This could be from bedbugs that were crushed or from their eggs or waste.  Bedbugs themselves. They are reddish-brown, oval, and flat. They do not fly. They are about the size of an apple seed.  Places to look for bedbugs include:  Beds. Check mattresses, headboards, box springs, and bed frames.  On drapes and curtains near the bed.  Under carpeting in the bedroom.  Behind electrical outlets.  Behind any wallpaper that is peeling.  Inside luggage. TREATMENT Most bedbug bites do not need treatment. They usually go away on their own in a few days. The bites are not dangerous. However, treatment may be needed if you have scratched so much that your skin has become infected. You may also need treatment if you are allergic to bedbug bites. Treatment options include:  A drug that stops swelling and itching (corticosteroid). Usually, a cream is rubbed on the skin. If you have a bad rash, you may be given a corticosteroid pill.  Oral antihistamines. These are pills to help control itching.  Antibiotic medicines. An antibiotic may be prescribed for infected  skin. HOME CARE INSTRUCTIONS   Take any medicine prescribed by your caregiver for your bites. Follow the directions carefully.  Consider wearing pajamas with long sleeves and pant legs.  Your bedroom may need to be treated. A pest control expert should make sure the bedbugs are gone. You may need to throw away mattresses or luggage. Ask the pest control expert what you can do to keep the bedbugs from coming back. Common suggestions include:  Putting a plastic cover over your mattress.  Washing and drying your clothes and bedding in hot water and a hot dryer.  The temperature should be hotter than 120 F (48.9 C). Bedbugs are killed by high temperatures.  Vacuuming carefully all around your bed. Vacuum in all cracks and crevices where the bugs might hide. Do this often.  Carefully checking all used furniture, bedding, or clothes that you bring into your house.  Eliminating bird nests and bat roosts.  If you get bedbug bites when traveling, check all your possessions carefully before bringing them into your house. If you find any bugs on clothes or in your luggage, consider throwing those items away. SEEK MEDICAL CARE IF:  You have red bug bites that keep coming back.  You have red bug bites that itch badly.  You have bug bites that cause a skin rash.  You have scratch marks that are red and sore. SEEK IMMEDIATE MEDICAL CARE IF: You have a fever. Document Released: 10/29/2010 Document Revised: 12/19/2011 Document Reviewed: 10/29/2010 [facility] Patient Information 2015 [place], [place]. This information is not intended to replace advice given to you by your health care provider. Make sure you discuss any questions you have with your health care provider.  Insect Bite Mosquitoes, flies, fleas, bedbugs, and many other insects can bite. Insect bites are different from insect stings. A sting is when venom is injected into the skin. Some insect bites can transmit infectious diseases. SYMPTOMS  Insect bites usually turn red, swell, and itch for 2 to 4 days. They often go away on their own. TREATMENT  Your caregiver may prescribe antibiotic medicines if a bacterial infection develops in the bite. HOME CARE INSTRUCTIONS  Do not scratch the bite area.  Keep the bite area clean and dry. Wash the bite area thoroughly with soap and water.  Put ice or cool compresses on the bite area.  Put ice in a plastic bag.  Place a towel between your skin and the bag.  Leave the ice on for 20 minutes, 4 times a day for the first 2 to 3 days, or as  directed.  You may apply a baking soda paste, cortisone cream, or calamine lotion to the bite area as directed by your caregiver. This can help reduce itching and swelling.  Only take over-the-counter or prescription medicines as directed by your caregiver.  If you are given antibiotics, take them as directed. Finish them even if you start to feel better. You may need a tetanus shot if:  You cannot remember when you had your last tetanus shot.  You have never had a tetanus shot.  The injury broke your skin. If you get a tetanus shot, your arm may swell, get red, and feel warm to the touch. This is common and not a problem. If you need a tetanus shot and you choose not to have one, there is a rare chance of getting tetanus. Sickness from tetanus can be serious. SEEK IMMEDIATE MEDICAL CARE IF:   You have increased pain, redness, or  swelling in the bite area.  You see a red line on the skin coming from the bite.  You have a fever.  You have joint pain.  You have a headache or neck pain.  You have unusual weakness.  You have a rash.  You have chest pain or shortness of breath.  You have abdominal pain, nausea, or vomiting.  You feel unusually tired or sleepy. MAKE SURE YOU:   Understand these instructions.  Will watch your condition.  Will get help right away if you are not doing well or get worse. Document Released: 11/03/2004 Document Revised: 12/19/2011 Document Reviewed: 04/27/2011 [facility]Exit[facility] Patient Information 2015 [place]Care, [place]. This information is not intended to replace advice given to you by your health care provider. Make sure you discuss any questions you have with your health care provider.

## 2014-04-03 NOTE — ED Provider Notes (Signed)
CSN: [id number]     Arrival date & time 04/03/14  1902 History   First MD Initiated Contact with Patient 04/03/14 1941     Chief Complaint  Patient presents with  . Insect Bite   (Consider location/radiation/quality/duration/timing/severity/associated sxs/prior Treatment) HPI Comments: Patient complaining of insect bites. Recently the exterminators told her she had been bites. She is complaining of small erythematous 2 flesh-colored rounded papules scattered about 2 months body surface areas. There are also avoid flat lesions with peripheral scaling and central clearing located to the upper left back and left shoulder that are different in appearance to the other lesions.   Past Medical History  Diagnosis Date  . No pertinent past medical history   . Depression     pp for 3 mos; was on medication   Past Surgical History  Procedure Laterality Date  . Dental surgery  July 2014, September 2013    wisdom tooth extraction; other oral surgery  . No past surgeries     Family History  Problem Relation Age of Onset  . Cancer Father   . Anemia Mother   . Prader-Willi syndrome Sister   . Diabetes Paternal Grandmother    History  Substance Use Topics  . Smoking status: Never Smoker   . Smokeless tobacco: Never Used  . Alcohol Use: No   OB History   Grav Para Term Preterm Abortions TAB SAB Ect Mult Living   3 3 3  0 0 0 0 0 0 3     Review of Systems  Skin: Positive for rash.  All other systems reviewed and are negative.   Allergies  Review of patient's allergies indicates no known allergies.  Home Medications   Prior to Admission medications   Medication Sig Start Date End Date Taking? Authorizing Provider  diphenhydrAMINE (BENADRYL) 25 MG tablet Take 1 tablet (25 mg total) by mouth every 6 (six) hours. 03/30/14   Fayrene HelperBowie Tran, PA-C  oxyCODONE-acetaminophen (PERCOCET) 7.5-325 MG per tablet Take 1 tablet by mouth every 4 (four) hours as needed for pain. 10/26/13   Juluis MireJohn S McComb,  MD  permethrin (ELIMITE) 5 % cream Apply from chin to feet. Rinse off 8 hours 04/03/14   Hayden Rasmussenavid Mabe, NP  triamcinolone cream (KENALOG) 0.1 % Apply 1 application topically 2 (two) times daily. For itching 04/03/14   Hayden Rasmussenavid Mabe, NP   BP 107/63  Pulse 72  Temp(Src) 98.6 F (37 C) (Oral)  Resp 16  SpO2 98%  LMP 03/29/2014 Physical Exam  Nursing note and vitals reviewed. Constitutional: She is oriented to person, place, and time. She appears well-developed and well-nourished. No distress.  Neck: Normal range of motion. Neck supple.  Neurological: She is alert and oriented to person, place, and time. She exhibits normal muscle tone.  Skin: Skin is warm and dry.  Multiple insect bite lesions as described above, consistent with bug bites. There are annular lesions to the upper back consistent with ringworm.  Psychiatric: She has a normal mood and affect.    ED Course  Procedures (including critical care time) Labs Review Labs Reviewed - No data to display  Imaging Review No results found.   MDM   1. Insect bites   2. Tinea corporis     elimite as dir trimiacinolone cream Use Lamisil for oval lesions/tinea Benadryl or claritin for itching    Hayden Rasmussenavid Mabe, NP 04/03/14 2002

## 2014-04-03 NOTE — ED Notes (Signed)
Patient complains of bed bug bites; states she had terminix out and they confirmed that she had bedbugs.

## 2014-04-04 NOTE — ED Provider Notes (Signed)
Medical screening examination/treatment/procedure(s) were performed by a resident physician or non-physician practitioner and as the supervising physician I was immediately available for consultation/collaboration.  Evan Corey, MD    Evan S Corey, MD 04/04/14 0737 

## 2014-05-29 ENCOUNTER — Other Ambulatory Visit: Payer: Self-pay | Admitting: Obstetrics and Gynecology

## 2014-08-11 ENCOUNTER — Encounter (HOSPITAL_COMMUNITY): Payer: Self-pay | Admitting: Emergency Medicine

## 2014-09-25 ENCOUNTER — Other Ambulatory Visit: Payer: Self-pay | Admitting: Obstetrics and Gynecology

## 2014-09-29 LAB — CYTOLOGY - PAP

## 2014-10-10 NOTE — L&D Delivery Note (Signed)
Patient was C/C/+2 and pushed for 1 minutes with epidural.   NSVD female infant, Apgars pending, weight pending.   The patient had no laceration. Fundus was firm. EBL was expected amount. Placenta was delivered intact. Vagina was clear.  Baby was vigorous and doing skin to skin with mother.  [name], Morgan Quinn

## 2015-03-26 ENCOUNTER — Other Ambulatory Visit: Payer: Self-pay

## 2015-03-26 LAB — OB RESULTS CONSOLE GC/CHLAMYDIA
CHLAMYDIA, DNA PROBE: NEGATIVE
GC PROBE AMP, GENITAL: NEGATIVE

## 2015-03-26 LAB — OB RESULTS CONSOLE ABO/RH: RH Type: NEGATIVE

## 2015-03-26 LAB — OB RESULTS CONSOLE RUBELLA ANTIBODY, IGM: RUBELLA: IMMUNE

## 2015-03-26 LAB — OB RESULTS CONSOLE ANTIBODY SCREEN: Antibody Screen: NEGATIVE

## 2015-03-26 LAB — OB RESULTS CONSOLE HIV ANTIBODY (ROUTINE TESTING): HIV: NONREACTIVE

## 2015-03-26 LAB — OB RESULTS CONSOLE RPR: RPR: NONREACTIVE

## 2015-03-26 LAB — OB RESULTS CONSOLE HEPATITIS B SURFACE ANTIGEN: HEP B S AG: NEGATIVE

## 2015-03-31 ENCOUNTER — Other Ambulatory Visit (HOSPITAL_COMMUNITY): Payer: Self-pay | Admitting: Obstetrics

## 2015-03-31 DIAGNOSIS — Z0489 Encounter for examination and observation for other specified reasons: Secondary | ICD-10-CM

## 2015-03-31 DIAGNOSIS — IMO0002 Reserved for concepts with insufficient information to code with codable children: Secondary | ICD-10-CM

## 2015-03-31 DIAGNOSIS — O0932 Supervision of pregnancy with insufficient antenatal care, second trimester: Secondary | ICD-10-CM

## 2015-04-07 ENCOUNTER — Ambulatory Visit (HOSPITAL_COMMUNITY): Admission: RE | Admit: 2015-04-07 | Payer: Medicaid Other | Source: Ambulatory Visit

## 2015-04-07 ENCOUNTER — Ambulatory Visit (HOSPITAL_COMMUNITY): Payer: Medicaid Other | Attending: Obstetrics

## 2015-05-04 ENCOUNTER — Other Ambulatory Visit (HOSPITAL_COMMUNITY): Payer: Self-pay | Admitting: Obstetrics

## 2015-05-05 ENCOUNTER — Ambulatory Visit (HOSPITAL_COMMUNITY): Payer: Medicaid Other | Attending: Obstetrics

## 2015-05-05 ENCOUNTER — Encounter (HOSPITAL_COMMUNITY): Payer: Self-pay

## 2015-05-05 ENCOUNTER — Other Ambulatory Visit (HOSPITAL_COMMUNITY): Payer: Self-pay | Admitting: Obstetrics

## 2015-05-05 DIAGNOSIS — Z3A26 26 weeks gestation of pregnancy: Secondary | ICD-10-CM

## 2015-05-05 DIAGNOSIS — O99322 Drug use complicating pregnancy, second trimester: Secondary | ICD-10-CM

## 2015-05-05 DIAGNOSIS — O0932 Supervision of pregnancy with insufficient antenatal care, second trimester: Secondary | ICD-10-CM

## 2015-05-05 DIAGNOSIS — Z3689 Encounter for other specified antenatal screening: Secondary | ICD-10-CM

## 2015-05-11 ENCOUNTER — Encounter (HOSPITAL_COMMUNITY): Payer: Self-pay | Admitting: Obstetrics

## 2015-05-19 ENCOUNTER — Ambulatory Visit (HOSPITAL_COMMUNITY)
Admission: RE | Admit: 2015-05-19 | Discharge: 2015-05-19 | Disposition: A | Discharge status: Home / Self Care | Payer: Medicaid Other | Source: Ambulatory Visit | Attending: Obstetrics | Admitting: Obstetrics

## 2015-05-19 ENCOUNTER — Encounter (HOSPITAL_COMMUNITY): Payer: Self-pay

## 2015-05-19 ENCOUNTER — Other Ambulatory Visit (HOSPITAL_COMMUNITY): Payer: Self-pay | Admitting: Obstetrics

## 2015-05-19 DIAGNOSIS — O99323 Drug use complicating pregnancy, third trimester: Secondary | ICD-10-CM

## 2015-05-19 DIAGNOSIS — O99322 Drug use complicating pregnancy, second trimester: Secondary | ICD-10-CM

## 2015-05-19 DIAGNOSIS — Z3689 Encounter for other specified antenatal screening: Secondary | ICD-10-CM

## 2015-05-19 DIAGNOSIS — F1920 Other psychoactive substance dependence, uncomplicated: Secondary | ICD-10-CM

## 2015-05-19 DIAGNOSIS — Z3A28 28 weeks gestation of pregnancy: Secondary | ICD-10-CM

## 2015-05-19 DIAGNOSIS — O0933 Supervision of pregnancy with insufficient antenatal care, third trimester: Secondary | ICD-10-CM

## 2015-05-19 DIAGNOSIS — F418 Other specified anxiety disorders: Secondary | ICD-10-CM | POA: Diagnosis not present

## 2015-05-19 DIAGNOSIS — O360120 Maternal care for anti-D [Rh] antibodies, second trimester, not applicable or unspecified: Secondary | ICD-10-CM

## 2015-05-19 DIAGNOSIS — O99343 Other mental disorders complicating pregnancy, third trimester: Secondary | ICD-10-CM | POA: Insufficient documentation

## 2015-05-19 DIAGNOSIS — O0932 Supervision of pregnancy with insufficient antenatal care, second trimester: Secondary | ICD-10-CM

## 2015-05-19 DIAGNOSIS — Z3A26 26 weeks gestation of pregnancy: Secondary | ICD-10-CM | POA: Diagnosis not present

## 2015-05-19 DIAGNOSIS — Z6791 Unspecified blood type, Rh negative: Secondary | ICD-10-CM | POA: Insufficient documentation

## 2015-05-19 DIAGNOSIS — O26893 Other specified pregnancy related conditions, third trimester: Secondary | ICD-10-CM | POA: Diagnosis not present

## 2015-05-19 DIAGNOSIS — O360130 Maternal care for anti-D [Rh] antibodies, third trimester, not applicable or unspecified: Secondary | ICD-10-CM

## 2015-05-19 NOTE — Progress Notes (Signed)
Maternal Fetal Medicine Consultation  Requesting Provider(s): Morgan Baars, MD  Reason for consultation: Late onset of prenatal care, Zoloft and Xanax exposure  HPI: Morgan Quinn is a 29 yo G4P3003 EDD 08/11/2015 who is currently at 28w 0d seen for consultation due to a history of Zoloft and Xanax exposure.  Morgan Quinn reports a long history of depression and anxiety - she was previously followed by Morgan Quinn but after losing her insurance, she has not been able to follow up with her.  She is currently on Zoloft 100 mg daily.  She was previously on Xanax every 8 hours prn for anxiety which was recently discontinued after her first OB visit.  Since stopping her Xanax, she reports increased anxiety.  She currently denies suicidal or homicidal ideation.  She is otherwise without complaints.  Her prenatal course has otherwise been uncomplicated.  OB History: OB History    Gravida Para Term Preterm AB TAB SAB Ectopic Multiple Living   4 3 3  0 0 0 0 0 0 3      PMH:  Past Medical History  Diagnosis Date  . No pertinent past medical history   . Depression     pp for 3 mos; was on medication    PSH:  Past Surgical History  Procedure Laterality Date  . Dental surgery  July 2014, September 2013    wisdom tooth extraction; other oral surgery  . No past surgeries     Meds:  Current Outpatient Prescriptions on File Prior to Encounter  Medication Sig Dispense Refill  . diphenhydrAMINE (BENADRYL) 25 MG tablet Take 1 tablet (25 mg total) by mouth every 6 (six) hours. 20 tablet 0  . oxyCODONE-acetaminophen (PERCOCET) 7.5-325 MG per tablet Take 1 tablet by mouth every 4 (four) hours as needed for pain. 30 tablet 0  . permethrin (ELIMITE) 5 % cream Apply from chin to feet. Rinse off 8 hours 60 g 0  . triamcinolone cream (KENALOG) 0.1 % Apply 1 application topically 2 (two) times daily. For itching 30 g 0   No current facility-administered medications on file prior to encounter.    Allergies: No Known Allergies   FH:  Family History  Problem Relation Age of Onset  . Cancer Father   . Anemia Mother   . Prader-Willi syndrome Sister   . Diabetes Paternal Grandmother     Soc:  History   Social History  . Marital Status: Single    Spouse Name: N/A  . Number of Children: N/A  . Years of Education: N/A   Occupational History  . Not on file.   Social History Main Topics  . Smoking status: Never Smoker   . Smokeless tobacco: Never Used  . Alcohol Use: No  . Drug Use: No  . Sexual Activity: Not Currently   Other Topics Concern  . Not on file   Social History Narrative    Review of Systems: no vaginal bleeding or cramping/contractions, no LOF, no nausea/vomiting. All other systems reviewed and are negative.  PE:   Filed Vitals:   05/19/15 1402  BP: 106/86  Pulse: 98    GEN: well-appearing female ABD: gravid, NT  Please see separate document for fetal ultrasound report.  A/P: 1) Single IUP at 28w 0d  2) Hx of depression / anxiety on Zoloft and previously on Xanax - Alprazolam (Xanax) has not been shown to increase teratogenic risks, however, it is associated with withdrawal symptoms after delivery.  Zoloft as well as other SSRI  agents may be associated with mild, transient neonatal syndromes of CNS, motor and GI symptoms.  Some studies have shown an increased risk of persistent pulmonary hypertension in some SSRIs (Paxil) but this has not been confirmed in other studies.  The maternal benefits of either drug should outweigh the potential risks to the fetus.  Would try to avoid Xanax if possible given the potential risks of withdrawal for the fetus.  Morgan Quinn has been unable to find a mental health provider to follow her since going on Medicaid.  Some contact information for several mental health clinics in the area that accept medicaid were given to the patient to arrange follow up.  Recommendation: 1) Recommend serial ultrasounds for growth  every 4 weeks.  Please contact our office if you would prefer that these studies be scheduled with MFM.   Thank you for the opportunity to be a part of the care of Morgan Quinn. Please contact our office if we can be of further assistance.   I spent approximately 30 minutes with this patient with over 50% of time spent in face-to-face counseling.  Morgan Gula, MD Maternal Fetal Medicine

## 2015-05-22 LAB — OB RESULTS CONSOLE GBS: GBS: NEGATIVE

## 2015-05-28 ENCOUNTER — Other Ambulatory Visit (HOSPITAL_COMMUNITY): Payer: Self-pay | Admitting: Obstetrics

## 2015-08-09 ENCOUNTER — Encounter (HOSPITAL_COMMUNITY): Payer: Self-pay | Admitting: *Deleted

## 2015-08-09 ENCOUNTER — Inpatient Hospital Stay (HOSPITAL_COMMUNITY)
Admission: AD | Admit: 2015-08-09 | Discharge: 2015-08-09 | Disposition: A | Discharge status: Home / Self Care | Payer: Medicaid Other | Source: Ambulatory Visit | Attending: Obstetrics and Gynecology | Admitting: Obstetrics and Gynecology

## 2015-08-09 DIAGNOSIS — Z3493 Encounter for supervision of normal pregnancy, unspecified, third trimester: Secondary | ICD-10-CM | POA: Insufficient documentation

## 2015-08-10 ENCOUNTER — Inpatient Hospital Stay (HOSPITAL_COMMUNITY)
Admission: AD | Admit: 2015-08-10 | Discharge: 2015-08-10 | Disposition: A | Discharge status: Home / Self Care | Payer: Medicaid Other | Source: Ambulatory Visit | Attending: Obstetrics | Admitting: Obstetrics

## 2015-08-10 ENCOUNTER — Encounter (HOSPITAL_COMMUNITY): Payer: Self-pay | Admitting: *Deleted

## 2015-08-10 ENCOUNTER — Other Ambulatory Visit: Payer: Self-pay | Admitting: Obstetrics

## 2015-08-10 DIAGNOSIS — Z3493 Encounter for supervision of normal pregnancy, unspecified, third trimester: Secondary | ICD-10-CM | POA: Insufficient documentation

## 2015-08-10 NOTE — MAU Note (Signed)
Pt. Was informed that she needed to be seen in the office to make and induction appt.  Pt. Informed me that she has an appt. Today at 1:00.  She stated, I don't think I am going to go to my appt. I am going to trick or treating with my kids tonight and try to go into labor on my own."  I informed pt. That her physician needs to see her to assess her and the baby and also discuss induction. I also informed her that it is important to get the GBS culture done so we have the results so we know the plan of care when she comes in to deliver the baby.  Pt. Voiced understanding but still wasn't sure if she was going to go to her appt. Today.

## 2015-08-10 NOTE — MAU Note (Signed)
Pt was seen in MAU during the night for labor eval, sent home.  States uc's have become more intense, is unable to sleep.  Denies bleeding or LOF.

## 2015-08-11 ENCOUNTER — Other Ambulatory Visit (HOSPITAL_COMMUNITY): Payer: Self-pay | Admitting: Obstetrics and Gynecology

## 2015-08-12 ENCOUNTER — Encounter (HOSPITAL_COMMUNITY): Payer: Self-pay

## 2015-08-12 ENCOUNTER — Inpatient Hospital Stay (HOSPITAL_COMMUNITY)
Admission: AD | Admit: 2015-08-12 | Discharge: 2015-08-14 | DRG: 775 | Disposition: A | Discharge status: Home / Self Care | Payer: Medicaid Other | Attending: Obstetrics and Gynecology | Admitting: Obstetrics and Gynecology

## 2015-08-12 ENCOUNTER — Inpatient Hospital Stay (HOSPITAL_COMMUNITY): Payer: Medicaid Other | Admitting: Anesthesiology

## 2015-08-12 DIAGNOSIS — Z3A40 40 weeks gestation of pregnancy: Secondary | ICD-10-CM | POA: Diagnosis not present

## 2015-08-12 DIAGNOSIS — Z6831 Body mass index (BMI) 31.0-31.9, adult: Secondary | ICD-10-CM | POA: Diagnosis not present

## 2015-08-12 DIAGNOSIS — E669 Obesity, unspecified: Secondary | ICD-10-CM | POA: Diagnosis present

## 2015-08-12 DIAGNOSIS — IMO0001 Reserved for inherently not codable concepts without codable children: Secondary | ICD-10-CM

## 2015-08-12 DIAGNOSIS — O99214 Obesity complicating childbirth: Principal | ICD-10-CM | POA: Diagnosis present

## 2015-08-12 LAB — CBC
HEMATOCRIT: 31.7 % — AB (ref 36.0–46.0)
HEMOGLOBIN: 10.3 g/dL — AB (ref 12.0–15.0)
MCH: 25.1 pg — ABNORMAL LOW (ref 26.0–34.0)
MCHC: 32.5 g/dL (ref 30.0–36.0)
MCV: 77.3 fL — AB (ref 78.0–100.0)
Platelets: 258 10*3/uL (ref 150–400)
RBC: 4.1 MIL/uL (ref 3.87–5.11)
RDW: 17.3 % — AB (ref 11.5–15.5)
WBC: 8 10*3/uL (ref 4.0–10.5)

## 2015-08-12 LAB — RPR: RPR Ser Ql: NONREACTIVE

## 2015-08-12 MED ORDER — OXYCODONE-ACETAMINOPHEN 5-325 MG PO TABS: 1.0000 | ORAL_TABLET | ORAL | Status: DC | PRN

## 2015-08-12 MED ORDER — OXYCODONE-ACETAMINOPHEN 5-325 MG PO TABS: 2.0000 | ORAL_TABLET | ORAL | Status: DC | PRN

## 2015-08-12 MED ORDER — TETANUS-DIPHTH-ACELL PERTUSSIS 5-2.5-18.5 LF-MCG/0.5 IM SUSP: 0.5000 mL | Freq: Once | INTRAMUSCULAR | Status: DC

## 2015-08-12 MED ORDER — LACTATED RINGERS IV SOLN
INTRAVENOUS | Status: DC
  Administered 2015-08-12: 07:00:00 via INTRAVENOUS

## 2015-08-12 MED ORDER — LANOLIN HYDROUS EX OINT: TOPICAL_OINTMENT | CUTANEOUS | Status: DC | PRN

## 2015-08-12 MED ORDER — EPHEDRINE 5 MG/ML INJ
10.0000 mg | INTRAVENOUS | Status: DC | PRN
  Filled 2015-08-12: qty 2

## 2015-08-12 MED ORDER — ONDANSETRON HCL 4 MG/2ML IJ SOLN
4.0000 mg | Freq: Four times a day (QID) | INTRAMUSCULAR | Status: DC | PRN
  Administered 2015-08-12: 4 mg via INTRAVENOUS
  Filled 2015-08-12: qty 2

## 2015-08-12 MED ORDER — OXYCODONE-ACETAMINOPHEN 5-325 MG PO TABS
2.0000 | ORAL_TABLET | ORAL | Status: DC | PRN
  Administered 2015-08-12 – 2015-08-14 (×8): 2 via ORAL
  Filled 2015-08-12 (×8): qty 2

## 2015-08-12 MED ORDER — OXYTOCIN BOLUS FROM INFUSION
500.0000 mL | INTRAVENOUS | Status: DC
  Administered 2015-08-12: 500 mL via INTRAVENOUS

## 2015-08-12 MED ORDER — OXYTOCIN 40 UNITS IN LACTATED RINGERS INFUSION - SIMPLE MED
62.5000 mL/h | INTRAVENOUS | Status: DC
  Filled 2015-08-12: qty 1000

## 2015-08-12 MED ORDER — DIPHENHYDRAMINE HCL 25 MG PO CAPS: 25.0000 mg | ORAL_CAPSULE | Freq: Four times a day (QID) | ORAL | Status: DC | PRN

## 2015-08-12 MED ORDER — ONDANSETRON HCL 4 MG PO TABS: 4.0000 mg | ORAL_TABLET | ORAL | Status: DC | PRN

## 2015-08-12 MED ORDER — FLEET ENEMA 7-19 GM/118ML RE ENEM: 1.0000 | ENEMA | RECTAL | Status: DC | PRN

## 2015-08-12 MED ORDER — DIBUCAINE 1 % RE OINT: 1.0000 "application " | TOPICAL_OINTMENT | RECTAL | Status: DC | PRN

## 2015-08-12 MED ORDER — LIDOCAINE HCL (PF) 1 % IJ SOLN
INTRAMUSCULAR | Status: DC | PRN
  Administered 2015-08-12: 2 mL via EPIDURAL
  Administered 2015-08-12: 5 mL via EPIDURAL
  Administered 2015-08-12: 3 mL via EPIDURAL

## 2015-08-12 MED ORDER — TERBUTALINE SULFATE 1 MG/ML IJ SOLN
0.2500 mg | Freq: Once | INTRAMUSCULAR | Status: DC | PRN
Start: 2015-08-12 — End: 2015-08-12
  Filled 2015-08-12: qty 1

## 2015-08-12 MED ORDER — LIDOCAINE HCL (PF) 1 % IJ SOLN
30.0000 mL | INTRAMUSCULAR | Status: DC | PRN
  Filled 2015-08-12: qty 30

## 2015-08-12 MED ORDER — WITCH HAZEL-GLYCERIN EX PADS: 1.0000 "application " | MEDICATED_PAD | CUTANEOUS | Status: DC | PRN

## 2015-08-12 MED ORDER — DIPHENHYDRAMINE HCL 50 MG/ML IJ SOLN: 12.5000 mg | INTRAMUSCULAR | Status: DC | PRN

## 2015-08-12 MED ORDER — OXYTOCIN 40 UNITS IN LACTATED RINGERS INFUSION - SIMPLE MED
1.0000 m[IU]/min | INTRAVENOUS | Status: DC
  Administered 2015-08-12: 2 m[IU]/min via INTRAVENOUS

## 2015-08-12 MED ORDER — PHENYLEPHRINE 40 MCG/ML (10ML) SYRINGE FOR IV PUSH (FOR BLOOD PRESSURE SUPPORT)
80.0000 ug | PREFILLED_SYRINGE | INTRAVENOUS | Status: DC | PRN
  Filled 2015-08-12: qty 2
  Filled 2015-08-12: qty 20

## 2015-08-12 MED ORDER — ACETAMINOPHEN 325 MG PO TABS: 650.0000 mg | ORAL_TABLET | ORAL | Status: DC | PRN

## 2015-08-12 MED ORDER — SENNOSIDES-DOCUSATE SODIUM 8.6-50 MG PO TABS
2.0000 | ORAL_TABLET | ORAL | Status: DC
  Administered 2015-08-13 – 2015-08-14 (×2): 2 via ORAL
  Filled 2015-08-12 (×2): qty 2

## 2015-08-12 MED ORDER — FENTANYL 2.5 MCG/ML BUPIVACAINE 1/10 % EPIDURAL INFUSION (WH - ANES)
14.0000 mL/h | INTRAMUSCULAR | Status: DC | PRN
  Administered 2015-08-12 (×2): 14 mL/h via EPIDURAL
  Filled 2015-08-12 (×2): qty 125

## 2015-08-12 MED ORDER — ONDANSETRON HCL 4 MG/2ML IJ SOLN: 4.0000 mg | INTRAMUSCULAR | Status: DC | PRN

## 2015-08-12 MED ORDER — ZOLPIDEM TARTRATE 5 MG PO TABS: 5.0000 mg | ORAL_TABLET | Freq: Every evening | ORAL | Status: DC | PRN

## 2015-08-12 MED ORDER — PRENATAL MULTIVITAMIN CH
1.0000 | ORAL_TABLET | Freq: Every day | ORAL | Status: DC
  Administered 2015-08-13: 1 via ORAL
  Filled 2015-08-12: qty 1

## 2015-08-12 MED ORDER — IBUPROFEN 600 MG PO TABS
600.0000 mg | ORAL_TABLET | Freq: Four times a day (QID) | ORAL | Status: DC
  Administered 2015-08-12 – 2015-08-14 (×7): 600 mg via ORAL
  Filled 2015-08-12 (×7): qty 1

## 2015-08-12 MED ORDER — LACTATED RINGERS IV SOLN
500.0000 mL | INTRAVENOUS | Status: DC | PRN
  Administered 2015-08-12: 500 mL via INTRAVENOUS

## 2015-08-12 MED ORDER — BENZOCAINE-MENTHOL 20-0.5 % EX AERO
1.0000 "application " | INHALATION_SPRAY | CUTANEOUS | Status: DC | PRN
  Administered 2015-08-14: 1 via TOPICAL
  Filled 2015-08-12: qty 56

## 2015-08-12 MED ORDER — SIMETHICONE 80 MG PO CHEW: 80.0000 mg | CHEWABLE_TABLET | ORAL | Status: DC | PRN

## 2015-08-12 MED ORDER — CITRIC ACID-SODIUM CITRATE 334-500 MG/5ML PO SOLN
30.0000 mL | ORAL | Status: DC | PRN
Start: 2015-08-12 — End: 2015-08-12

## 2015-08-12 NOTE — Anesthesia Preprocedure Evaluation (Addendum)
Anesthesia Evaluation  Patient identified by MRN, date of birth, ID band Patient awake    Reviewed: Allergy & Precautions, NPO status , Patient's Chart, lab work & pertinent test results  History of Anesthesia Complications Negative for: history of anesthetic complications  Airway Mallampati: II  TM Distance: >3 FB Neck ROM: Full    Dental  (+) Teeth Intact, Dental Advisory Given   Pulmonary neg pulmonary ROS,    Pulmonary exam normal breath sounds clear to auscultation       Cardiovascular Exercise Tolerance: Good negative cardio ROS Normal cardiovascular exam Rhythm:Regular Rate:Normal     Neuro/Psych PSYCHIATRIC DISORDERS Depression negative neurological ROS     GI/Hepatic negative GI ROS, Neg liver ROS,   Endo/Other  Obesity   Renal/GU negative Renal ROS     Musculoskeletal negative musculoskeletal ROS (+)   Abdominal   Peds  Hematology  (+) Blood dyscrasia, anemia ,   Anesthesia Other Findings Day of surgery medications reviewed with the patient.  Reproductive/Obstetrics (+) Pregnancy                            Anesthesia Physical Anesthesia Plan  ASA: II  Anesthesia Plan: Epidural   Post-op Pain Management:    Induction:   Airway Management Planned:   Additional Equipment:   Intra-op Plan:   Post-operative Plan:   Informed Consent: I have reviewed the patients History and Physical, chart, labs and discussed the procedure including the risks, benefits and alternatives for the proposed anesthesia with the patient or authorized representative who has indicated his/her understanding and acceptance.   Dental advisory given  Plan Discussed with:   Anesthesia Plan Comments: (Patient identified. Risks/Benefits/Options discussed with patient including but not limited to bleeding, infection, nerve damage, paralysis, failed block, incomplete pain control, headache, blood  pressure changes, nausea, vomiting, reactions to medication both or allergic, itching and postpartum back pain. Confirmed with bedside nurse the patient's most recent platelet count. Confirmed with patient that they are not currently taking any anticoagulation, have any bleeding history or any family history of bleeding disorders. Patient expressed understanding and wished to proceed. All questions were answered. )        Anesthesia Quick Evaluation

## 2015-08-12 NOTE — H&P (Addendum)
29 y.o. 7937w1d  G4P3003 comes in c/o painful contractions.  Otherwise has good fetal movement and no bleeding.  Past Medical History  Diagnosis Date  . No pertinent past medical history   . Depression     pp for 3 mos; was on medication    Past Surgical History  Procedure Laterality Date  . Dental surgery  July 2014, September 2013    wisdom tooth extraction; other oral surgery  . No past surgeries      OB History  Gravida Para Term Preterm AB SAB TAB Ectopic Multiple Living  4 3 3  0 0 0 0 0 0 3    # Outcome Date GA Lbr Len/2nd Weight Sex Delivery Anes PTL Lv  4 Current           3 Term 10/24/13 8220w0d 12:22 / 00:18 3.459 kg (7 lb 10 oz) M Vag-Spont EPI  Y  2 Term 03/07/12 7249w4d 19:30 / 00:30 2.614 kg (5 lb 12.2 oz) M Vag-Spont EPI  Y  1 Term 05/24/07 6320w0d 24:00 2.551 kg (5 lb 10 oz) F Vag-Spont EPI  Y      Social History   Social History  . Marital Status: Single    Spouse Name: N/A  . Number of Children: N/A  . Years of Education: N/A   Occupational History  . Not on file.   Social History Main Topics  . Smoking status: Never Smoker   . Smokeless tobacco: Never Used  . Alcohol Use: No  . Drug Use: No  . Sexual Activity: Not Currently   Other Topics Concern  . Not on file   Social History Narrative   Review of patient's allergies indicates no known allergies.    Prenatal Transfer Tool  Maternal Diabetes: No Genetic Screening: Declined Maternal Ultrasounds/Referrals: Normal Fetal Ultrasounds or other Referrals:  Referred to Materal Fetal Medicine  normal scan Maternal Substance Abuse:  Yes:  Type: Prescription drugs benzodiazepine in first trimester Significant Maternal Medications:  Meds include: Prozac Significant Maternal Lab Results: Lab values include: Group B Strep negative  Other PNC: late entry to prenatal care, anxiety, h.o LEEP after last pregnancy, gential warts    Filed Vitals:   08/12/15 0836  BP: 104/59  Pulse: 69  Temp:   Resp:       Lungs/Cor:  NAD Abdomen:  soft, gravid Ex:  no cords, erythema SVE:  2/100/-3 at admission, no 3/100/-1 with SROM FHTs:  125, good STV, NST R Toco:  q3-5   A/P   Admit with labor   Pt received epidural upon admission  GBS Neg  Other routine care SW consult postpartum  Tajuanna Burnett, Luther ParodySIDNEY

## 2015-08-12 NOTE — Anesthesia Procedure Notes (Addendum)
Epidural Patient location during procedure: OB  Staffing Anesthesiologist: Cecile HearingURK, Christino Mcglinchey EDWARD Performed by: anesthesiologist   Preanesthetic Checklist Completed: patient identified, pre-op evaluation, timeout performed, IV checked, risks and benefits discussed and monitors and equipment checked  Epidural Patient position: sitting Prep: DuraPrep Patient monitoring: blood pressure and continuous pulse ox Approach: midline Location: L3-L4 Injection technique: LOR air  Needle:  Needle type: Tuohy  Needle gauge: 17 G Needle length: 9 cm Needle insertion depth: 6 cm Catheter size: 19 Gauge Catheter at skin depth: 11 cm Test dose: negative and Other (1% Lidocaine)  Additional Notes Patient identified.  Risk benefits discussed including failed block, incomplete pain control, headache, nerve damage, paralysis, blood pressure changes, nausea, vomiting, reactions to medication both toxic or allergic, and postpartum back pain.  Patient expressed understanding and wished to proceed.  All questions were answered.  Sterile technique used throughout procedure and epidural site dressed with sterile barrier dressing. No paresthesia or other complications noted. The patient did not experience any signs of intravascular injection such as tinnitus or metallic taste in mouth nor signs of intrathecal spread such as rapid motor block. Please see nursing notes for vital signs. Reason for block:procedure for pain  Epidural

## 2015-08-12 NOTE — MAU Note (Signed)
PT  SAYS HURT BAD  AT 0400   VE IN OFFICE   1-2  CM.     DENIES HSV  AND  MRSA.  GBS-   UNSURE

## 2015-08-13 LAB — CBC
HCT: 30.1 % — ABNORMAL LOW (ref 36.0–46.0)
Hemoglobin: 9.3 g/dL — ABNORMAL LOW (ref 12.0–15.0)
MCH: 24.3 pg — ABNORMAL LOW (ref 26.0–34.0)
MCHC: 30.9 g/dL (ref 30.0–36.0)
MCV: 78.8 fL (ref 78.0–100.0)
PLATELETS: 220 10*3/uL (ref 150–400)
RBC: 3.82 MIL/uL — AB (ref 3.87–5.11)
RDW: 17.5 % — AB (ref 11.5–15.5)
WBC: 11.8 10*3/uL — AB (ref 4.0–10.5)

## 2015-08-13 MED ORDER — FLUOXETINE HCL 10 MG PO CAPS
10.0000 mg | ORAL_CAPSULE | Freq: Every day | ORAL | Status: DC
  Administered 2015-08-13: 10 mg via ORAL
  Filled 2015-08-13: qty 1

## 2015-08-13 NOTE — Progress Notes (Signed)
UR chart review completed.  

## 2015-08-13 NOTE — Addendum Note (Signed)
Addendum  created 08/13/15 1658 by Collier FlowersElizabeth J Shaleah Nissley, CRNA   Modules edited: Charges VN, Notes Section   Notes Section:  File: [id number]

## 2015-08-13 NOTE — Progress Notes (Signed)
Post Partum Day 1 Subjective: no complaints, up ad lib, voiding and tolerating PO  Objective: Blood pressure 96/56, pulse 61, temperature 98.3 F (36.8 C), temperature source Oral, resp. rate 18, height 5\' 4"  (1.626 m), weight 180 lb (81.647 kg), last menstrual period 11/04/2014, SpO2 98 %, unknown if currently breastfeeding.  Physical Exam:  General: alert, cooperative and appears stated age Lochia: appropriate Uterine Fundus: firm   Recent Labs  08/12/15 0630 08/13/15 0440  HGB 10.3* 9.3*  HCT 31.7* 30.1*    Assessment/Plan: Plan for discharge tomorrow  Breast/bottle feeding circ in office   LOS: 1 day   Jereline Ticer H. 08/13/2015, 9:41 AM

## 2015-08-13 NOTE — Anesthesia Postprocedure Evaluation (Signed)
  Anesthesia Post-op Note  Patient: Morgan Quinn  Procedure(s) Performed: * No procedures listed *  Patient Location: Mother/Baby  Anesthesia Type:Epidural  Level of Consciousness: awake, alert , oriented and patient cooperative  Airway and Oxygen Therapy: Patient Spontanous Breathing  Post-op Pain: mild  Post-op Assessment: Post-op Vital signs reviewed, Patient's Cardiovascular Status Stable, Respiratory Function Stable, Patent Airway, No signs of Nausea or vomiting, Adequate PO intake, Pain level controlled and No headache. Anesthesiologist in to day to speak with patient regarding sore pback. istory of prior epidural with back pain post delivery several months. Per patient physician discussed possible therapy if needed. Patient apprears calm and in no discomfort at this time.              Post-op Vital Signs: Reviewed and stable  Last Vitals:  Filed Vitals:   08/13/15 0539  BP: 96/56  Pulse: 61  Temp: 36.8 C  Resp: 18    Complications: No apparent anesthesia complications

## 2015-08-13 NOTE — Lactation Note (Signed)
This note was copied from the chart of Morgan Bobby [name]. Lactation Consultation Note BF her 2nd child for 4 months. Pumped some after she went to work and bottle fed. Her 3rd child it hurt to bad so she couldn't do it. This baby is BF well and states he likes BF and going well.  Mom has tubular breast w/nipple on the bottom rounded and flat. Very compressible nipple and areola and w/stimulation everts for a latch. Mom stating she didn't have milk yet only colostrum. I explained that's right, its comes first because its very important for the baby. Mom supplementing w/formula. Explained composure of colostrum and formula w/decrease milk supply.  Mom encouraged to feed baby 8-12 times/24 hours and with feeding cues. Referred to Baby and Me Book in Breastfeeding section Pg. 22-23 for position options and Proper latch demonstration. Educated about newborn behavior. WH/LC brochure given w/resources, support groups and LC services. Patient Name: Morgan Quinn ZOXWR'UToday's Date: 08/13/2015 Reason for consult: Initial assessment   Maternal Data Has patient been taught Hand Expression?: Yes Does the patient have breastfeeding experience prior to this delivery?: Yes  Feeding    LATCH Score/Interventions       Type of Nipple: Everted at rest and after stimulation  Comfort (Breast/Nipple): Filling, red/small blisters or bruises, mild/mod discomfort  Problem noted: Mild/Moderate discomfort Interventions (Mild/moderate discomfort): Hand massage;Hand expression  Intervention(s): Breastfeeding basics reviewed;Support Pillows;Position options;Skin to skin     Lactation Tools Discussed/Used WIC Program: Yes   Consult Status Consult Status: Follow-up Date: 08/14/15 Follow-up type: In-patient    Charyl DancerCARVER, Arsh Feutz G 08/13/2015, 1:34 AM

## 2015-08-13 NOTE — Clinical Social Work Maternal (Signed)
CLINICAL SOCIAL WORK MATERNAL/CHILD NOTE  Patient Details  Name: Chauncy PassyJasmine C Soeder MRN: [id number] Date of Birth: 03/05/1986  Date:  08/13/2015  Clinical Social Worker Initiating Note:  Loleta BooksSarah Thereasa Iannello MSW, LCSW Date/ Time Initiated:  08/13/15/1300     Child's Name:  Duwayne HeckAxel   Legal Guardian:  Bobby RumpfJasmine Hayes and Joselyn Glassmanyler   Need for Interpreter:  None   Date of Referral:  08/12/15     Reason for Referral:  History of depression, anxiety, and postpartum depression  [facility]   Address:  [address] [place], [place] [address]  Phone number:  [phone]   Household Members:  Minor Children (AspinwallMaddox, Vincenza HewsQuinn, and [place]), Significant Other   Natural Supports (not living in the home):  Extended Family, Immediate Family   Professional Supports: Visual merchandiserTherapist and Psychiatrist at the [employer]  Employment: Full-time   Education:  Associate ProfessorHigh school graduate   Financial Resources:  Medicaid   Other Resources:  [employer] , [facility]   Cultural/Religious Considerations Which May Impact Care:  None reported  Strengths:  Ability to meet basic needs , Pediatrician chosen , Home prepared for child    Risk Factors/Current Problems:  Mental Health Concerns , Family/Relationship Issues    Cognitive State:  Able to Concentrate , Alert , Goal Oriented , Linear Thinking    Mood/Affect:  Tearful , Anxious , Overwhelmed    CSW Assessment:  CSW received request for consult due to MOB presenting with a history of anxiety and depression.  MOB presented as easily engaged and receptive to the visit. She displayed a full range in affect and was noted to be in a pleasant mood. MOB provided consent for her grandmother and the FOB to remain in the room during the visit.    MOB openly discussed and processed the range of emotions she experienced when she first learned that she was pregnant, how she felt throughout the pregnancy, and how she feels as she transitions postpartum.   MOB endorsed presence of numerous psychosocial stressors in her life, including a highly strained relationship with her mother, uncertainty on how to best support her younger brother, and how to cope/balance caring for herself and her now 4 children.   MOB presented with insight on how these stressors have impacted her life, and shared that she believes it has led to depression, anxiety, and a strained relationship with the FOB.  MOB discussed how she is attempting to problem solve on how to reduce stress, but expressed normative feelings associated with uncertainty on how to best move forward.  MOB presented as receptive to exploring positive and negative aspects of each potential outcome related to her mother and her brother, but continues to feel uncertain on which direction she wants to pursue.    Per MOB, she often feels defeated and overwhelmed when she feels that stressors are consuming her life. She presented as receptive to exploring cognitive techniques that may assist her to cope with stressors and how to increase sense of hope when she begins to feel overwhelmed.  MOB stated that she is motivated by her children, and discussed at length how she wants to be a positive role model for them.  She also presented as receptive to exploring how to identify areas of gratitude in her life when she begins to feel that all is negative in her life.   Per MOB,  She experienced postpartum depression in 2013 and 2015.  MOB stated that prior to this pregnancy, she has  a history of participating in therapy and medication management. She reported that she changed providers during this pregnancy due to insurance.  MOB stated that she currently receives mental health care at the [facility] and finds it beneficial. She shared that she was previously prescribed Zoloft and Xanax, but Zoloft was changed to Prozac since it was "supposed to work faster".  MOB stated that she does not believe that she has taken Prozac for  enough time to know if it is working, but shared that her psychiatrist informed her that she return to Zoloft if needed.  MOB stated that she discontinued all psychotropic medications 2 weeks ago since she wanted to breastfeed since she heard that there were potential risks for the infant if she was on medication while breastfeeding.  MOB receptive to exploring potential risks and gains for herself and the infant if she were to take the medication and not take the medication. After further exploration of her feelings, she voiced desire to re-start Prozac since she believes that it will have a positive benefit on her mental health.   MOB expressed appreciation for the visit and the support.  She was noted to be smiling more at the end of the assessment, and was able to verbalize her sense of self-efficacy since she was able to identify her strengths, her goals, and what she is working toward in the future.    CSW Plan/Description:   1. Patient/Family Education: MOB presents with history of postpartum depression, and MOB endorsed symptoms of depression and anxiety during the pregnancy.  CSW provided education on increased risk for ongoing symptoms of depression and anxiety as she transitions postpartum.   2. CSW consulted with RN regarding MOB's inpatient medications. MOB stated that she is currently prescribed Prozac and Xanax by her psychiatrist at the [facility]. She shared that she has not taken any medications in past 2 weeks since she wanted to breastfeed, but discussed desire to re-start medications during this current admission.  MOB provided consent for CSW to consult with her medical providers since these medications are currently not ordered for her.  RN to follow up with MOB's OB regarding inpatient medications.    3. MOB reported to follow up with her therapist and psychiatrist at the [facility].  MOB stated that she does not have follow up appointments scheduled, but reported intention to  schedule appointments now that she is no longer pregnant. She stated that she believes that appointments are helpful and have a potential positive impact on her mental health.    4. CSW assisted the MOB to process her thoughts and feelings related to the psychosocial stressors, and utilized brief therapy techniques from CBT, DBT, and Solutions Focused Therapy to continue to support and problem solve with MOB.  CSW also assisted the MOB to create a self-care plan to support her mental health and explored with MOB how to maintain sense of hope when she begins to feel overwhelmed with stressors.   5. No Further Intervention Required/No Barriers to Discharge; however, CSW able to follow up PRN.   Pervis Hocking, LCSW 08/13/2015, 2:53 PM

## 2015-08-14 MED ORDER — OXYCODONE-ACETAMINOPHEN 5-325 MG PO TABS: 1.0000 | ORAL_TABLET | ORAL | Status: DC | PRN

## 2015-08-14 MED ORDER — IBUPROFEN 200 MG PO TABS: 800.0000 mg | ORAL_TABLET | Freq: Four times a day (QID) | ORAL | Status: DC | PRN

## 2015-08-14 NOTE — Progress Notes (Signed)
Patient is eating, ambulating, voiding.  Pain control is good.  Filed Vitals:   08/12/15 2330 08/13/15 0539 08/13/15 1700 08/14/15 0513  BP: 113/64 96/56 100/57 109/66  Pulse: 77 61 76 61  Temp: 97.7 F (36.5 C) 98.3 F (36.8 C) 97.5 F (36.4 C) 97.9 F (36.6 C)  TempSrc: Oral  Oral Oral  Resp: 18 18 18 18   Height:      Weight:      SpO2:        Fundus firm Perineum without swelling.  Lab Results  Component Value Date   WBC 11.8* 08/13/2015   HGB 9.3* 08/13/2015   HCT 30.1* 08/13/2015   MCV 78.8 08/13/2015   PLT 220 08/13/2015    --/--/O NEG (11/02 0630)/RI  A/P Post partum day 2.  Routine care.  Expect d/c today.  Baby O neg- no Rhogam.  Tayson Schnelle A

## 2015-08-14 NOTE — Discharge Summary (Signed)
Obstetric Discharge Summary Reason for Admission: onset of labor Prenatal Procedures: none Intrapartum Procedures: spontaneous vaginal delivery Postpartum Procedures: none Complications-Operative and Postpartum: none HEMOGLOBIN  Date Value Ref Range Status  08/13/2015 9.3* 12.0 - 15.0 g/dL Final   HCT  Date Value Ref Range Status  08/13/2015 30.1* 36.0 - 46.0 % Final    Discharge Diagnoses: Term Pregnancy-delivered  Discharge Information: Date: 08/14/2015 Activity: pelvic rest Diet: routine Medications: Ibuprofen and Iron Condition: stable Instructions: refer to practice specific booklet Discharge to: home Follow-up Information    Follow up with CALLAHAN, SIDNEY, DO In 4 weeks.   Specialty:  Obstetrics and Gynecology   Contact information:   [address] [place] [address] [phone]       Newborn Data: Live born female  Birth Weight: 6 lb 12.6 oz (3079 g) APGAR: 9, 9  Home with mother.  Morgan Quinn A 08/14/2015, 7:33 AM

## 2015-08-14 NOTE — Lactation Note (Signed)
This note was copied from the chart of Morgan Quinn. Lactation Consultation Note  Patient Name: Morgan Quinn  Mother is ready for discharge. She reports breastfeeding is going well and denies need for lactation service/ support. Mother has been breastfeeding and formula feeding.   Maternal Data    Feeding Feeding Type: Bottle Fed - Formula  LATCH Score/Interventions                      Lactation Tools Discussed/Used     Consult Status      Christella HartiganDaly, Alexianna Nachreiner [name], 11:12 AM

## 2015-08-15 LAB — TYPE AND SCREEN
ABO/RH(D): O NEG
Antibody Screen: POSITIVE
DAT, IGG: NEGATIVE
UNIT DIVISION: 0
Unit division: 0
Unit division: 0
Unit division: 0

## 2015-08-19 ENCOUNTER — Inpatient Hospital Stay (HOSPITAL_COMMUNITY): Admission: RE | Admit: 2015-08-19 | Payer: Medicaid Other | Source: Ambulatory Visit

## 2015-09-08 ENCOUNTER — Other Ambulatory Visit: Payer: Self-pay | Admitting: Obstetrics and Gynecology

## 2015-09-10 LAB — CYTOLOGY - PAP

## 2015-11-25 ENCOUNTER — Encounter: Payer: Self-pay | Admitting: Obstetrics and Gynecology

## 2016-01-24 ENCOUNTER — Encounter (HOSPITAL_COMMUNITY): Payer: Self-pay | Admitting: *Deleted

## 2016-01-24 ENCOUNTER — Inpatient Hospital Stay (HOSPITAL_COMMUNITY)
Admission: AD | Admit: 2016-01-24 | Discharge: 2016-01-24 | Disposition: A | Discharge status: Home / Self Care | Payer: Medicaid Other | Source: Ambulatory Visit | Attending: Obstetrics and Gynecology | Admitting: Obstetrics and Gynecology

## 2016-01-24 DIAGNOSIS — N760 Acute vaginitis: Secondary | ICD-10-CM | POA: Diagnosis not present

## 2016-01-24 DIAGNOSIS — N3000 Acute cystitis without hematuria: Secondary | ICD-10-CM | POA: Insufficient documentation

## 2016-01-24 DIAGNOSIS — R300 Dysuria: Secondary | ICD-10-CM | POA: Insufficient documentation

## 2016-01-24 DIAGNOSIS — F329 Major depressive disorder, single episode, unspecified: Secondary | ICD-10-CM | POA: Insufficient documentation

## 2016-01-24 DIAGNOSIS — A499 Bacterial infection, unspecified: Secondary | ICD-10-CM

## 2016-01-24 DIAGNOSIS — B9689 Other specified bacterial agents as the cause of diseases classified elsewhere: Secondary | ICD-10-CM

## 2016-01-24 LAB — WET PREP, GENITAL
Sperm: NONE SEEN
Trich, Wet Prep: NONE SEEN
Yeast Wet Prep HPF POC: NONE SEEN

## 2016-01-24 LAB — URINALYSIS, ROUTINE W REFLEX MICROSCOPIC
Bilirubin Urine: NEGATIVE
Glucose, UA: NEGATIVE mg/dL
HGB URINE DIPSTICK: NEGATIVE
Ketones, ur: NEGATIVE mg/dL
NITRITE: NEGATIVE
PH: 6.5 (ref 5.0–8.0)
Protein, ur: NEGATIVE mg/dL
SPECIFIC GRAVITY, URINE: 1.015 (ref 1.005–1.030)

## 2016-01-24 LAB — URINE MICROSCOPIC-ADD ON: RBC / HPF: NONE SEEN RBC/hpf (ref 0–5)

## 2016-01-24 LAB — POCT PREGNANCY, URINE: PREG TEST UR: NEGATIVE

## 2016-01-24 MED ORDER — METRONIDAZOLE 0.75 % VA GEL: 1.0000 | Freq: Two times a day (BID) | VAGINAL | Status: DC

## 2016-01-24 MED ORDER — SULFAMETHOXAZOLE-TRIMETHOPRIM 800-160 MG PO TABS: 1.0000 | ORAL_TABLET | Freq: Two times a day (BID) | ORAL | Status: DC

## 2016-01-24 MED ORDER — PHENAZOPYRIDINE HCL 200 MG PO TABS: 200.0000 mg | ORAL_TABLET | Freq: Three times a day (TID) | ORAL | Status: DC | PRN

## 2016-01-24 MED ORDER — PHENAZOPYRIDINE HCL 100 MG PO TABS
200.0000 mg | ORAL_TABLET | Freq: Once | ORAL | Status: AC
  Administered 2016-01-24: 200 mg via ORAL
  Filled 2016-01-24: qty 2

## 2016-01-24 MED ORDER — METRONIDAZOLE 500 MG PO TABS: 500.0000 mg | ORAL_TABLET | Freq: Two times a day (BID) | ORAL | Status: DC

## 2016-01-24 NOTE — Discharge Instructions (Signed)
Urinary Tract Infection Urinary tract infections (UTIs) can develop anywhere along your urinary tract. Your urinary tract is your body's drainage system for removing wastes and extra water. Your urinary tract includes two kidneys, two ureters, a bladder, and a urethra. Your kidneys are a pair of bean-shaped organs. Each kidney is about the size of your fist. They are located below your ribs, one on each side of your spine. CAUSES Infections are caused by microbes, which are microscopic organisms, including fungi, viruses, and bacteria. These organisms are so small that they can only be seen through a microscope. Bacteria are the microbes that most commonly cause UTIs. SYMPTOMS  Symptoms of UTIs may vary by age and gender of the patient and by the location of the infection. Symptoms in young women typically include a frequent and intense urge to urinate and a painful, burning feeling in the bladder or urethra during urination. Older women and men are more likely to be tired, shaky, and weak and have muscle aches and abdominal pain. A fever may mean the infection is in your kidneys. Other symptoms of a kidney infection include pain in your back or sides below the ribs, nausea, and vomiting. DIAGNOSIS To diagnose a UTI, your caregiver will ask you about your symptoms. Your caregiver will also ask you to provide a urine sample. The urine sample will be tested for bacteria and white blood cells. White blood cells are made by your body to help fight infection. TREATMENT  Typically, UTIs can be treated with medication. Because most UTIs are caused by a bacterial infection, they usually can be treated with the use of antibiotics. The choice of antibiotic and length of treatment depend on your symptoms and the type of bacteria causing your infection. HOME CARE INSTRUCTIONS  If you were prescribed antibiotics, take them exactly as your caregiver instructs you. Finish the medication even if you feel better after  you have only taken some of the medication.  Drink enough water and fluids to keep your urine clear or pale yellow.  Avoid caffeine, tea, and carbonated beverages. They tend to irritate your bladder.  Empty your bladder often. Avoid holding urine for long periods of time.  Empty your bladder before and after sexual intercourse.  After a bowel movement, women should cleanse from front to back. Use each tissue only once. SEEK MEDICAL CARE IF:   You have back pain.  You develop a fever.  Your symptoms do not begin to resolve within 3 days. SEEK IMMEDIATE MEDICAL CARE IF:   You have severe back pain or lower abdominal pain.  You develop chills.  You have nausea or vomiting.  You have continued burning or discomfort with urination. MAKE SURE YOU:   Understand these instructions.  Will watch your condition.  Will get help right away if you are not doing well or get worse.   This information is not intended to replace advice given to you by your health care provider. Make sure you discuss any questions you have with your health care provider.   Document Released: 07/06/2005 Document Revised: 06/17/2015 Document Reviewed: 11/04/2011 Elsevier Interactive Patient Education 2016 Elsevier Inc. Bacterial Vaginosis Bacterial vaginosis is a vaginal infection that occurs when the normal balance of bacteria in the vagina is disrupted. It results from an overgrowth of certain bacteria. This is the most common vaginal infection in women of childbearing age. Treatment is important to prevent complications, especially in pregnant women, as it can cause a premature delivery. CAUSES  Bacterial   vaginosis is caused by an increase in harmful bacteria that are normally present in smaller amounts in the vagina. Several different kinds of bacteria can cause bacterial vaginosis. However, the reason that the condition develops is not fully understood. RISK FACTORS Certain activities or behaviors can  put you at an increased risk of developing bacterial vaginosis, including:  Having a new sex partner or multiple sex partners.  Douching.  Using an intrauterine device (IUD) for contraception. Women do not get bacterial vaginosis from toilet seats, bedding, swimming pools, or contact with objects around them. SIGNS AND SYMPTOMS  Some women with bacterial vaginosis have no signs or symptoms. Common symptoms include:  Grey vaginal discharge.  A fishlike odor with discharge, especially after sexual intercourse.  Itching or burning of the vagina and vulva.  Burning or pain with urination. DIAGNOSIS  Your health care provider will take a medical history and examine the vagina for signs of bacterial vaginosis. A sample of vaginal fluid may be taken. Your health care provider will look at this sample under a microscope to check for bacteria and abnormal cells. A vaginal pH test may also be done.  TREATMENT  Bacterial vaginosis may be treated with antibiotic medicines. These may be given in the form of a pill or a vaginal cream. A second round of antibiotics may be prescribed if the condition comes back after treatment. Because bacterial vaginosis increases your risk for sexually transmitted diseases, getting treated can help reduce your risk for chlamydia, gonorrhea, HIV, and herpes. HOME CARE INSTRUCTIONS   Only take over-the-counter or prescription medicines as directed by your health care provider.  If antibiotic medicine was prescribed, take it as directed. Make sure you finish it even if you start to feel better.  Tell all sexual partners that you have a vaginal infection. They should see their health care provider and be treated if they have problems, such as a mild rash or itching.  During treatment, it is important that you follow these instructions:  Avoid sexual activity or use condoms correctly.  Do not douche.  Avoid alcohol as directed by your health care provider.  Avoid  breastfeeding as directed by your health care provider. SEEK MEDICAL CARE IF:   Your symptoms are not improving after 3 days of treatment.  You have increased discharge or pain.  You have a fever. MAKE SURE YOU:   Understand these instructions.  Will watch your condition.  Will get help right away if you are not doing well or get worse. FOR MORE INFORMATION  Centers for Disease Control and Prevention, Division of STD Prevention: www.cdc.gov/std American Sexual Health Association (ASHA): www.ashastd.org    This information is not intended to replace advice given to you by your health care provider. Make sure you discuss any questions you have with your health care provider.   Document Released: 09/26/2005 Document Revised: 10/17/2014 Document Reviewed: 05/08/2013 Elsevier Interactive Patient Education 2016 Elsevier Inc.  

## 2016-01-24 NOTE — MAU Note (Signed)
Thinks she has a UTI.  Having abd pain, hurst when she pees. Frequency and urgency, pain, throbbing and pressure after she goes.

## 2016-01-24 NOTE — MAU Provider Note (Signed)
Chief Complaint: Dysuria   First Provider Initiated Contact with Patient 01/24/16 1441     SUBJECTIVE HPI: Morgan Quinn is a 30 y.o. [phone] [id number] female who presents to Maternity Admissions reporting dysuria 2 weeks that was improving, but got much worse 2 days ago. Now reports severe, sharp pain in urethra immediately after voiding makes her feels that she has to void again. Having some low abdominal cramping as well. Feels like ovulation pain, but this is worse than usual. LMP April 13,017.  Location: Urethra Quality: Sharp Severity: 9/10 on pain scale Duration: 2 weeks Context: Started around the time that she changed soaps Timing: Intermittent Course: Worsening Modifying factors: Initially improved with increasing water intake, but got much worse over the past 2 days. Associated signs and symptoms: Positive for cloudy urine. Negative for hematuria, fever, nausea, vomiting, flank pain.  Location: Low abdomen Quality: Cramping Severity: 9/10 on pain scale Duration: 2-3 days Context: Around the time of ovulation Timing: Intermittent Modifying factors: Hasn't tried anything for pain. There are no aggravating or alleviating factors. Associated signs and symptoms: Positive for vaginal discharge and go urine. Negative for hematuria, intermenstrual bleeding, fever, chills, nausea, vomiting.  Past Medical History  Diagnosis Date  . No pertinent past medical history   . Depression     pp for 3 mos; was on medication   OB History  Gravida Para Term Preterm AB SAB TAB Ectopic Multiple Living  4 4 4  0 0 0 0 0 0 4    # Outcome Date GA Lbr Len/2nd Weight Sex Delivery Anes PTL Lv  4 Term 08/12/15 2570w1d 11:45 / 00:17 6 lb 12.6 oz (3.079 kg) Genella MechM Vag-Spont EPI  Y  3 Term 10/24/13 4938w0d 12:22 / 00:18 7 lb 10 oz (3.459 kg) Genella MechM Vag-Spont EPI  Y  2 Term 03/07/12 5436w4d 19:30 / 00:30 5 lb 12.2 oz (2.614 kg) M Vag-Spont EPI  Y  1 Term 05/24/07 6638w0d 24:00 5 lb 10 oz (2.551 kg) F Vag-Spont EPI  Y      Past Surgical History  Procedure Laterality Date  . Dental surgery  July 2014, September 2013    wisdom tooth extraction; other oral surgery  . No past surgeries     Social History   Social History  . Marital Status: Single    Spouse Name: N/A  . Number of Children: N/A  . Years of Education: N/A   Occupational History  . Not on file.   Social History Main Topics  . Smoking status: Never Smoker   . Smokeless tobacco: Never Used  . Alcohol Use: No  . Drug Use: No  . Sexual Activity: Not Currently   Other Topics Concern  . Not on file   Social History Narrative   No current facility-administered medications on file prior to encounter.   Current Outpatient Prescriptions on File Prior to Encounter  Medication Sig Dispense Refill  . Prenatal Vit-Fe Fumarate-FA (PRENATAL MULTIVITAMIN) TABS tablet Take 1 tablet by mouth daily at 12 noon.     Marland Kitchen. ibuprofen (MOTRIN IB) 200 MG tablet Take 4 tablets (800 mg total) by mouth every 6 (six) hours as needed. (Patient taking differently: Take 800 mg by mouth every 6 (six) hours as needed for moderate pain. ) 30 tablet 0   No Known Allergies  I have reviewed the past Medical Hx, Surgical Hx, Social Hx, Allergies and Medications.   Review of Systems  Constitutional: Negative for fever and chills.  Gastrointestinal: Positive for abdominal pain.  Negative for nausea, vomiting, diarrhea and constipation.  Genitourinary: Positive for dysuria, urgency, frequency and vaginal discharge. Negative for hematuria, flank pain, vaginal bleeding, genital sores, vaginal pain and menstrual problem.  Musculoskeletal: Negative for back pain.    OBJECTIVE Patient Vitals for the past 24 hrs:  BP Temp Temp src Pulse Resp  01/24/16 1354 97/72 mmHg 98 F (36.7 C) Oral 88 18   Constitutional: Well-developed, well-nourished female in no acute distress.  Cardiovascular: normal rate Respiratory: normal rate and effort.  GI: Abd soft, mild SP tenderness.  No guarding or mass. Feels urge to urinate w/ abd palpation.  MS: Extremities nontender, no edema, normal ROM Neurologic: Alert and oriented x 4.  GU: Neg CVAT.  PELVIC EXAM: NEFG, small amount of thin white discharge, no blood noted, cervix closed, uterus normal size, no adnexal tenderness or masses. No CMT.  LAB RESULTS Results for orders placed or performed during the hospital encounter of 01/24/16 (from the past 24 hour(s))  Urinalysis, Routine w reflex microscopic (not at [facility])     Status: Abnormal   Collection Time: 01/24/16  1:55 PM  Result Value Ref Range   Color, Urine YELLOW YELLOW   APPearance CLEAR CLEAR   Specific Gravity, Urine 1.015 1.005 - 1.030   pH 6.5 5.0 - 8.0   Glucose, UA NEGATIVE NEGATIVE mg/dL   Hgb urine dipstick NEGATIVE NEGATIVE   Bilirubin Urine NEGATIVE NEGATIVE   Ketones, ur NEGATIVE NEGATIVE mg/dL   Protein, ur NEGATIVE NEGATIVE mg/dL   Nitrite NEGATIVE NEGATIVE   Leukocytes, UA TRACE (A) NEGATIVE  Urine microscopic-add on     Status: Abnormal   Collection Time: 01/24/16  1:55 PM  Result Value Ref Range   Squamous Epithelial / LPF 6-30 (A) NONE SEEN   WBC, UA 6-30 0 - 5 WBC/hpf   RBC / HPF NONE SEEN 0 - 5 RBC/hpf   Bacteria, UA RARE (A) NONE SEEN   Urine-Other AMORPHOUS URATES/PHOSPHATES   Pregnancy, urine POC     Status: None   Collection Time: 01/24/16  2:08 PM  Result Value Ref Range   Preg Test, Ur NEGATIVE NEGATIVE  Wet prep, genital     Status: Abnormal   Collection Time: 01/24/16  3:00 PM  Result Value Ref Range   Yeast Wet Prep HPF POC NONE SEEN NONE SEEN   Trich, Wet Prep NONE SEEN NONE SEEN   Clue Cells Wet Prep HPF POC PRESENT (A) NONE SEEN   WBC, Wet Prep HPF POC MODERATE (A) NONE SEEN   Sperm NONE SEEN     IMAGING No results found.  MAU COURSE UA, UPT, Wet prep, GC/Chlamydia.   Pt insistent that she has UTI even though UA only shows trace LE. Recommend urine culture and using Pyridium until results are back. Called Dr.  Claiborne Billings to discuss POC--Is in OR.   Discussed with Dr. Claiborne Billings. Agrees with plan to prescribe Bactrim, Pyridium and Flagyl.  MDM - 30 year old non-pregnant female highly symptomatic for UTI. Wet prep also shows BV. Low suspicion for PID based on absence of fever, leukocytosis or cervical motion tenderness.  ASSESSMENT 1. Acute cystitis without hematuria   2. BV (bacterial vaginosis)     PLAN Discharge home in stable condition. Push fluids. GC/Chlamydia cultures pending.      Follow-up Information    Follow up with CALLAHAN, SIDNEY, DO.   Specialty:  Obstetrics and Gynecology   Why:  For non-emergent gynecology care   Contact information:   [address]  [address] [place] [address] [phone]       Follow up with Primary care provider.   Why:  As needed if symptoms worsen       Medication List    TAKE these medications        ibuprofen 200 MG tablet  Commonly known as:  MOTRIN IB  Take 4 tablets (800 mg total) by mouth every 6 (six) hours as needed.     metroNIDAZOLE 500 MG tablet  Commonly known as:  FLAGYL  Take 1 tablet (500 mg total) by mouth 2 (two) times daily.     phenazopyridine 200 MG tablet  Commonly known as:  PYRIDIUM  Take 1 tablet (200 mg total) by mouth 3 (three) times daily as needed for pain.     prenatal multivitamin Tabs tablet  Take 1 tablet by mouth daily at 12 noon.     sulfamethoxazole-trimethoprim 800-160 MG tablet  Commonly known as:  BACTRIM DS,SEPTRA DS  Take 1 tablet by mouth 2 (two) times daily.         [place], [place] 01/24/2016  3:49 PM

## 2016-01-25 LAB — GC/CHLAMYDIA PROBE AMP (~~LOC~~) NOT AT ARMC
Chlamydia: NEGATIVE
Neisseria Gonorrhea: NEGATIVE

## 2016-05-02 ENCOUNTER — Inpatient Hospital Stay (HOSPITAL_COMMUNITY)
Admission: AD | Admit: 2016-05-02 | Discharge: 2016-05-02 | Discharge status: Against Medical Advice | Payer: Medicaid Other | Source: Ambulatory Visit | Attending: Obstetrics & Gynecology | Admitting: Obstetrics & Gynecology

## 2016-05-02 DIAGNOSIS — N899 Noninflammatory disorder of vagina, unspecified: Secondary | ICD-10-CM | POA: Diagnosis not present

## 2016-05-02 DIAGNOSIS — Z5321 Procedure and treatment not carried out due to patient leaving prior to being seen by health care provider: Secondary | ICD-10-CM | POA: Insufficient documentation

## 2016-05-02 LAB — URINALYSIS, ROUTINE W REFLEX MICROSCOPIC
BILIRUBIN URINE: NEGATIVE
Glucose, UA: NEGATIVE mg/dL
Hgb urine dipstick: NEGATIVE
KETONES UR: NEGATIVE mg/dL
LEUKOCYTES UA: NEGATIVE
NITRITE: NEGATIVE
Protein, ur: NEGATIVE mg/dL
pH: 5.5 (ref 5.0–8.0)

## 2016-05-02 LAB — POCT PREGNANCY, URINE: PREG TEST UR: POSITIVE — AB

## 2016-05-02 NOTE — MAU Note (Signed)
Not in lobby

## 2016-05-02 NOTE — MAU Note (Signed)
Pt reports she has a yeast infection. States she is very irritated in vaginal area. Also reports she "may have a slight BV infection."

## 2016-07-12 ENCOUNTER — Emergency Department (HOSPITAL_COMMUNITY)
Admission: EM | Admit: 2016-07-12 | Discharge: 2016-07-13 | Disposition: A | Discharge status: Home / Self Care | Payer: Medicaid Other | Attending: Emergency Medicine | Admitting: Emergency Medicine

## 2016-07-12 ENCOUNTER — Encounter (HOSPITAL_COMMUNITY): Payer: Self-pay | Admitting: Emergency Medicine

## 2016-07-12 DIAGNOSIS — R938 Abnormal findings on diagnostic imaging of other specified body structures: Secondary | ICD-10-CM | POA: Diagnosis not present

## 2016-07-12 DIAGNOSIS — M79631 Pain in right forearm: Secondary | ICD-10-CM | POA: Insufficient documentation

## 2016-07-12 DIAGNOSIS — S1093XA Contusion of unspecified part of neck, initial encounter: Secondary | ICD-10-CM | POA: Diagnosis not present

## 2016-07-12 DIAGNOSIS — T07XXXA Unspecified multiple injuries, initial encounter: Secondary | ICD-10-CM

## 2016-07-12 DIAGNOSIS — Y999 Unspecified external cause status: Secondary | ICD-10-CM | POA: Diagnosis not present

## 2016-07-12 DIAGNOSIS — S0990XA Unspecified injury of head, initial encounter: Secondary | ICD-10-CM | POA: Diagnosis present

## 2016-07-12 DIAGNOSIS — Y92511 Restaurant or cafe as the place of occurrence of the external cause: Secondary | ICD-10-CM | POA: Diagnosis not present

## 2016-07-12 DIAGNOSIS — S0512XA Contusion of eyeball and orbital tissues, left eye, initial encounter: Secondary | ICD-10-CM

## 2016-07-12 DIAGNOSIS — Y9389 Activity, other specified: Secondary | ICD-10-CM | POA: Diagnosis not present
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
## 2016-07-12 NOTE — ED Notes (Signed)
Family at bedside. 
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
## 2016-07-13 ENCOUNTER — Emergency Department (HOSPITAL_COMMUNITY): Payer: Medicaid Other

## 2016-07-13 LAB — I-STAT BETA HCG BLOOD, ED (MC, WL, AP ONLY): I-stat hCG, quantitative: 31.1 m[IU]/mL — ABNORMAL HIGH (ref <5)

## 2016-07-13 MED ORDER — HYDROCODONE-ACETAMINOPHEN 5-325 MG PO TABS: 2.0000 | ORAL_TABLET | ORAL | 0 refills | Status: DC | PRN

## 2016-07-13 MED ORDER — IBUPROFEN 400 MG PO TABS: 400.0000 mg | ORAL_TABLET | Freq: Four times a day (QID) | ORAL | 0 refills | Status: DC | PRN

## 2016-07-13 MED ORDER — HYDROCODONE-ACETAMINOPHEN 5-325 MG PO TABS
1.0000 | ORAL_TABLET | Freq: Once | ORAL | Status: AC
  Administered 2016-07-13: 1 via ORAL
  Filled 2016-07-13: qty 1
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
## 2016-07-13 NOTE — Discharge Instructions (Signed)
Tonight your xrays are normal
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
## 2016-07-13 NOTE — ED Notes (Signed)
Patient transported to X-ray 

## 2016-07-13 NOTE — ED Notes (Signed)
MD at bedside. 

## 2016-07-13 NOTE — ED Notes (Signed)
Patient transported to CT 

## 2016-11-04 ENCOUNTER — Encounter (HOSPITAL_COMMUNITY): Payer: Self-pay

## 2016-11-04 ENCOUNTER — Inpatient Hospital Stay (HOSPITAL_COMMUNITY)
Admission: AD | Admit: 2016-11-04 | Discharge: 2016-11-04 | Disposition: A | Discharge status: Home / Self Care | Payer: Medicaid Other | Source: Ambulatory Visit | Attending: Obstetrics and Gynecology | Admitting: Obstetrics and Gynecology

## 2016-11-04 DIAGNOSIS — B354 Tinea corporis: Secondary | ICD-10-CM | POA: Diagnosis not present

## 2016-11-04 DIAGNOSIS — R21 Rash and other nonspecific skin eruption: Secondary | ICD-10-CM | POA: Diagnosis not present

## 2016-11-04 DIAGNOSIS — N898 Other specified noninflammatory disorders of vagina: Secondary | ICD-10-CM | POA: Diagnosis present

## 2016-11-04 DIAGNOSIS — Z113 Encounter for screening for infections with a predominantly sexual mode of transmission: Secondary | ICD-10-CM

## 2016-11-04 LAB — URINALYSIS, ROUTINE W REFLEX MICROSCOPIC
BILIRUBIN URINE: NEGATIVE
Glucose, UA: NEGATIVE mg/dL
Hgb urine dipstick: NEGATIVE
KETONES UR: NEGATIVE mg/dL
Leukocytes, UA: NEGATIVE
NITRITE: NEGATIVE
PH: 7 (ref 5.0–8.0)
PROTEIN: NEGATIVE mg/dL
Specific Gravity, Urine: 1.019 (ref 1.005–1.030)

## 2016-11-04 LAB — WET PREP, GENITAL
Clue Cells Wet Prep HPF POC: NONE SEEN
SPERM: NONE SEEN
Trich, Wet Prep: NONE SEEN
YEAST WET PREP: NONE SEEN

## 2016-11-04 LAB — POCT PREGNANCY, URINE: PREG TEST UR: NEGATIVE

## 2016-11-04 MED ORDER — KETOCONAZOLE 2 % EX CREA: 1.0000 "application " | TOPICAL_CREAM | Freq: Every day | CUTANEOUS | 0 refills | Status: AC

## 2016-11-04 NOTE — MAU Note (Signed)
Pt presents to MAU with complaints of clear vaginal discharge and a rash on her bottom. Pt states that she noticed the rash after using the restroom at a restaurant 2 months ago. States she has been treating it with over the counter medication,.

## 2016-11-04 NOTE — MAU Provider Note (Signed)
History     CSN: [id number]  Arrival date and time: 11/04/16 1249   First Provider Initiated Contact with Patient 11/04/16 1353      Chief Complaint  Patient presents with  . Vaginal Discharge   HPI  Morgan Quinn is a 31 y.o. [phone] female who presents for vaginal discharge & rash. Symptoms began 1 month ago. Reports rash on her right buttocks after sitting on a restaurant toilet seat. Describes as dry & itchy. States it has grown in size over the last month. Denies fever, pain, or exudate.  Vaginal discharge x 1 month. States it was initially "normal" but has increased in amount and changed to a watery yellow/white discharge. No odor or vaginal irritation. Patient is sexually active with one partner & uses condoms. Denies dyspareunia or postcoital bleeding.   Past Medical History:  Diagnosis Date  . Depression    pp for 3 mos; was on medication    Past Surgical History:  Procedure Laterality Date  . DENTAL SURGERY  July 2014, September 2013   wisdom tooth extraction; other oral surgery    Family History  Problem Relation Age of Onset  . Cancer Father   . Anemia Mother   . Prader-Willi syndrome Sister   . Diabetes Paternal Grandmother     Social History  Substance Use Topics  . Smoking status: Never Smoker  . Smokeless tobacco: Never Used  . Alcohol use No    Allergies: No Known Allergies  No prescriptions prior to admission.    Review of Systems  Constitutional: Negative.   Gastrointestinal: Negative.   Genitourinary: Positive for vaginal discharge. Negative for dyspareunia, dysuria and vaginal bleeding.  Skin: Positive for rash.   Physical Exam   Blood pressure 112/87, pulse 84, temperature 98 F (36.7 C), resp. rate 18, height 5\' 6"  (1.676 m), weight 157 lb (71.2 kg), last menstrual period 10/17/2016, unknown if currently breastfeeding.  Physical Exam  Nursing note and vitals reviewed. Constitutional: She is oriented to person, place, and time.  She appears well-developed and well-nourished. No distress.  HENT:  Head: Normocephalic and atraumatic.  Eyes: Conjunctivae are normal. Right eye exhibits no discharge. Left eye exhibits no discharge. No scleral icterus.  Neck: Normal range of motion.  Respiratory: Effort normal. No respiratory distress.  GI: Soft. She exhibits no distension. There is no tenderness.  Genitourinary:    No bleeding in the vagina. Vaginal discharge (small amount of thin white discharge) found.  Neurological: She is alert and oriented to person, place, and time.  Skin: Skin is warm and dry. She is not diaphoretic.  Psychiatric: She has a normal mood and affect. Her behavior is normal. Judgment and thought content normal.    MAU Course  Procedures Results for orders placed or performed during the hospital encounter of 11/04/16 (from the past 24 hour(s))  Urinalysis, Routine w reflex microscopic     Status: Abnormal   Collection Time: 11/04/16  1:34 PM  Result Value Ref Range   Color, Urine YELLOW YELLOW   APPearance HAZY (A) CLEAR   Specific Gravity, Urine 1.019 1.005 - 1.030   pH 7.0 5.0 - 8.0   Glucose, UA NEGATIVE NEGATIVE mg/dL   Hgb urine dipstick NEGATIVE NEGATIVE   Bilirubin Urine NEGATIVE NEGATIVE   Ketones, ur NEGATIVE NEGATIVE mg/dL   Protein, ur NEGATIVE NEGATIVE mg/dL   Nitrite NEGATIVE NEGATIVE   Leukocytes, UA NEGATIVE NEGATIVE  Pregnancy, urine POC     Status: None   Collection Time:  11/04/16  1:38 PM  Result Value Ref Range   Preg Test, Ur NEGATIVE NEGATIVE  Wet prep, genital     Status: Abnormal   Collection Time: 11/04/16  2:23 PM  Result Value Ref Range   Yeast Wet Prep HPF POC NONE SEEN NONE SEEN   Trich, Wet Prep NONE SEEN NONE SEEN   Clue Cells Wet Prep HPF POC NONE SEEN NONE SEEN   WBC, Wet Prep HPF POC FEW (A) NONE SEEN   Sperm NONE SEEN     MDM UPT negative GC/CT & wet prep Pt decline bloodwork for STI testing S/w Dr. Dareen PianoAnderson. Ok with plan of care.  Assessment  and Plan  A: 1. Tinea corporis   2. Screen for STD (sexually transmitted disease)    P: Discharge home Rx ketoconazole [facility] GC/CT pending Discussed reasons to return to MAU F/u with PCP if rash worsened or doesn't improve  Judeth Hornrin Sylver Vantassell 11/04/2016, 1:54 PM

## 2016-11-04 NOTE — Discharge Instructions (Signed)
Body Ringworm Introduction Body ringworm is an infection of the skin that often causes a ring-shaped rash. Body ringworm can affect any part of your skin. It can spread easily to others. Body ringworm is also called tinea corporis. What are the causes? This condition is caused by funguses called dermatophytes. The condition develops when these funguses grow out of control on the skin. You can get this condition if you touch a person or animal that has it. You can also get it if you share clothing, bedding, towels, or any other object with an infected person or pet. What increases the risk? This condition is more likely to develop in:  Athletes who often make skin-to-skin contact with other athletes, such as wrestlers.  People who share equipment and mats.  People with a weakened immune system. What are the signs or symptoms? Symptoms of this condition include:  Itchy, raised red spots and bumps.  Red scaly patches.  A ring-shaped rash. The rash may have:  A clear center.  Scales or red bumps at its center.  Redness near its borders.  Dry and scaly skin on or around it. How is this diagnosed? This condition can usually be diagnosed with a skin exam. A skin scraping may be taken from the affected area and examined under a microscope to see if the fungus is present. How is this treated? This condition may be treated with:  An antifungal cream or ointment.  An antifungal shampoo.  Antifungal medicines. These may be prescribed if your ringworm is severe, keeps coming back, or lasts a long time. Follow these instructions at home:  Take over-the-counter and prescription medicines only as told by your health care provider.  If you were given an antifungal cream or ointment:  Use it as told by your health care provider.  Wash the infected area and dry it completely before applying the cream or ointment.  If you were given an antifungal shampoo:  Use it as told by your  health care provider.  Leave the shampoo on your body for 3-5 minutes before rinsing.  While you have a rash:  Wear loose clothing to stop clothes from rubbing and irritating it.  Wash or change your bed sheets every night.  If your pet has the same infection, take your pet to see a veterinarian. How is this prevented?  Practice good hygiene.  Wear sandals or shoes in public places and showers.  Do not share personal items with others.  Avoid touching red patches of skin on other people.  Avoid touching pets that have bald spots.  If you touch an animal that has a bald spot, wash your hands. Contact a health care provider if:  Your rash continues to spread after 7 days of treatment.  Your rash is not gone in 4 weeks.  The area around your rash gets red, warm, tender, and swollen. This information is not intended to replace advice given to you by your health care provider. Make sure you discuss any questions you have with your health care provider. Document Released: 09/23/2000 Document Revised: 03/03/2016 Document Reviewed: 07/23/2015  2017 Elsevier  

## 2016-11-08 LAB — GC/CHLAMYDIA PROBE AMP (~~LOC~~) NOT AT ARMC
CHLAMYDIA, DNA PROBE: NEGATIVE
NEISSERIA GONORRHEA: NEGATIVE

## 2017-03-05 ENCOUNTER — Inpatient Hospital Stay (HOSPITAL_COMMUNITY)
Admission: AD | Admit: 2017-03-05 | Discharge: 2017-03-05 | Disposition: A | Discharge status: Home / Self Care | Payer: Medicaid Other | Source: Ambulatory Visit | Attending: Obstetrics | Admitting: Obstetrics

## 2017-03-05 ENCOUNTER — Encounter (HOSPITAL_COMMUNITY): Payer: Self-pay | Admitting: *Deleted

## 2017-03-05 ENCOUNTER — Inpatient Hospital Stay (HOSPITAL_COMMUNITY): Payer: Medicaid Other

## 2017-03-05 DIAGNOSIS — Z3A01 Less than 8 weeks gestation of pregnancy: Secondary | ICD-10-CM | POA: Diagnosis not present

## 2017-03-05 DIAGNOSIS — R300 Dysuria: Secondary | ICD-10-CM | POA: Diagnosis present

## 2017-03-05 DIAGNOSIS — R109 Unspecified abdominal pain: Secondary | ICD-10-CM

## 2017-03-05 DIAGNOSIS — O26899 Other specified pregnancy related conditions, unspecified trimester: Secondary | ICD-10-CM

## 2017-03-05 DIAGNOSIS — O26891 Other specified pregnancy related conditions, first trimester: Secondary | ICD-10-CM

## 2017-03-05 DIAGNOSIS — O2341 Unspecified infection of urinary tract in pregnancy, first trimester: Secondary | ICD-10-CM | POA: Diagnosis not present

## 2017-03-05 DIAGNOSIS — N390 Urinary tract infection, site not specified: Secondary | ICD-10-CM | POA: Insufficient documentation

## 2017-03-05 LAB — URINALYSIS, ROUTINE W REFLEX MICROSCOPIC
Bilirubin Urine: NEGATIVE
Glucose, UA: NEGATIVE mg/dL
Hgb urine dipstick: NEGATIVE
Ketones, ur: NEGATIVE mg/dL
Nitrite: NEGATIVE
PROTEIN: 30 mg/dL — AB
SPECIFIC GRAVITY, URINE: 1.023 (ref 1.005–1.030)
pH: 5 (ref 5.0–8.0)

## 2017-03-05 LAB — WET PREP, GENITAL
Clue Cells Wet Prep HPF POC: NONE SEEN
SPERM: NONE SEEN
TRICH WET PREP: NONE SEEN
YEAST WET PREP: NONE SEEN

## 2017-03-05 LAB — HCG, QUANTITATIVE, PREGNANCY: HCG, BETA CHAIN, QUANT, S: 163 m[IU]/mL — AB (ref <5)

## 2017-03-05 LAB — CBC
HEMATOCRIT: 29.7 % — AB (ref 36.0–46.0)
HEMOGLOBIN: 9.4 g/dL — AB (ref 12.0–15.0)
MCH: 23.3 pg — ABNORMAL LOW (ref 26.0–34.0)
MCHC: 31.6 g/dL (ref 30.0–36.0)
MCV: 73.5 fL — ABNORMAL LOW (ref 78.0–100.0)
Platelets: 332 10*3/uL (ref 150–400)
RBC: 4.04 MIL/uL (ref 3.87–5.11)
RDW: 17.8 % — ABNORMAL HIGH (ref 11.5–15.5)
WBC: 5.3 10*3/uL (ref 4.0–10.5)

## 2017-03-05 LAB — POCT PREGNANCY, URINE: PREG TEST UR: POSITIVE — AB

## 2017-03-05 MED ORDER — CEPHALEXIN 500 MG PO CAPS: 500.0000 mg | ORAL_CAPSULE | Freq: Four times a day (QID) | ORAL | 0 refills | Status: DC

## 2017-03-05 NOTE — MAU Note (Signed)
Pt presents to MAU with complaints of urinary tract infection. States she has urine frequency and had old prescription of flagyl and it was working but she didn't have a full prescription

## 2017-03-05 NOTE — MAU Provider Note (Signed)
Chief Complaint: Urinary Tract Infection   First Provider Initiated Contact with Patient 03/05/17 1317      SUBJECTIVE HPI: Morgan Quinn is a 31 y.o. G5P4004 at 286w1d by LMP who presents to maternity admissions reporting dysuria and suprapubic pain, concerns she may have a UTI. Patient's last menstrual period was 02/04/2017.  She denies sharp abdominal pain or pain on the left or right side of abdomen.  She is sexually active and not using any birth control currently. She had irregular bleeding with Mirena IUD and had it taken out earlier this year.  She has not tried any treatments and nothing makes her symptoms better or worse. There are no associated symptoms. She denies vaginal bleeding, vaginal itching/burning, h/a, dizziness, n/v, or fever/chills.     HPI  Past Medical History:  Diagnosis Date  . Depression    pp for 3 mos; was on medication   Past Surgical History:  Procedure Laterality Date  . DENTAL SURGERY  July 2014, September 2013   wisdom tooth extraction; other oral surgery   Social History   Social History  . Marital status: Single    Spouse name: N/A  . Number of children: N/A  . Years of education: N/A   Occupational History  . Not on file.   Social History Main Topics  . Smoking status: Never Smoker  . Smokeless tobacco: Never Used  . Alcohol use No  . Drug use: No  . Sexual activity: Not Currently   Other Topics Concern  . Not on file   Social History Narrative  . No narrative on file   No current facility-administered medications on file prior to encounter.    Current Outpatient Prescriptions on File Prior to Encounter  Medication Sig Dispense Refill  . HYDROcodone-acetaminophen (NORCO/VICODIN) 5-325 MG tablet Take 2 tablets by mouth every 4 (four) hours as needed. 10 tablet 0  . ibuprofen (ADVIL,MOTRIN) 400 MG tablet Take 1 tablet (400 mg total) by mouth every 6 (six) hours as needed. 30 tablet 0   No Known Allergies  ROS:  Review of  Systems  Constitutional: Negative for chills, fatigue and fever.  Respiratory: Negative for shortness of breath.   Cardiovascular: Negative for chest pain.  Genitourinary: Positive for dysuria and pelvic pain. Negative for difficulty urinating, flank pain, vaginal bleeding, vaginal discharge and vaginal pain.  Neurological: Negative for dizziness and headaches.  Psychiatric/Behavioral: Negative.      I have reviewed patient's Past Medical Hx, Surgical Hx, Family Hx, Social Hx, medications and allergies.   Physical Exam  Patient Vitals for the past 24 hrs:  BP Temp Pulse Resp Height Weight  03/05/17 1201 120/74 98 F (36.7 C) 90 18 5\' 3"  (1.6 m) 149 lb (67.6 kg)   Constitutional: Well-developed, well-nourished female in no acute distress.  Cardiovascular: normal rate Respiratory: normal effort GI: Abd soft, non-tender. Pos BS x 4 MS: Extremities nontender, no edema, normal ROM Neurologic: Alert and oriented x 4.  GU: Neg CVAT.  PELVIC EXAM: Cervix pink, visually closed, without lesion, scant white creamy discharge, vaginal walls and external genitalia normal Bimanual exam: Cervix 0/long/high, firm, anterior, neg CMT, uterus nontender, nonenlarged, adnexa without tenderness, enlargement, or mass   LAB RESULTS Results for orders placed or performed during the hospital encounter of 03/05/17 (from the past 24 hour(s))  Urinalysis, Routine w reflex microscopic     Status: Abnormal   Collection Time: 03/05/17 12:00 PM  Result Value Ref Range   Color, Urine YELLOW YELLOW  APPearance HAZY (A) CLEAR   Specific Gravity, Urine 1.023 1.005 - 1.030   pH 5.0 5.0 - 8.0   Glucose, UA NEGATIVE NEGATIVE mg/dL   Hgb urine dipstick NEGATIVE NEGATIVE   Bilirubin Urine NEGATIVE NEGATIVE   Ketones, ur NEGATIVE NEGATIVE mg/dL   Protein, ur 30 (A) NEGATIVE mg/dL   Nitrite NEGATIVE NEGATIVE   Leukocytes, UA LARGE (A) NEGATIVE   RBC / HPF 0-5 0 - 5 RBC/hpf   WBC, UA TOO NUMEROUS TO COUNT 0 - 5  WBC/hpf   Bacteria, UA RARE (A) NONE SEEN   Squamous Epithelial / LPF 0-5 (A) NONE SEEN   Mucous PRESENT   Pregnancy, urine POC     Status: Abnormal   Collection Time: 03/05/17 12:14 PM  Result Value Ref Range   Preg Test, Ur POSITIVE (A) NEGATIVE  Wet prep, genital     Status: Abnormal   Collection Time: 03/05/17  1:30 PM  Result Value Ref Range   Yeast Wet Prep HPF POC NONE SEEN NONE SEEN   Trich, Wet Prep NONE SEEN NONE SEEN   Clue Cells Wet Prep HPF POC NONE SEEN NONE SEEN   WBC, Wet Prep HPF POC MANY (A) NONE SEEN   Sperm NONE SEEN   CBC     Status: Abnormal   Collection Time: 03/05/17  1:33 PM  Result Value Ref Range   WBC 5.3 4.0 - 10.5 K/uL   RBC 4.04 3.87 - 5.11 MIL/uL   Hemoglobin 9.4 (L) 12.0 - 15.0 g/dL   HCT 16.1 (L) 09.6 - 04.5 %   MCV 73.5 (L) 78.0 - 100.0 fL   MCH 23.3 (L) 26.0 - 34.0 pg   MCHC 31.6 30.0 - 36.0 g/dL   RDW 40.9 (H) 81.1 - 91.4 %   Platelets 332 150 - 400 K/uL       IMAGING No results found.  MAU Management/MDM: Ordered labs and reviewed results.  Consult Dr Chestine Spore to discuss assessment and findings.  Pt pain is minimal and c/w UTI.  Pt cannot stay in MAU for further evaluation today, was not aware of pregnancy until today's test, only thought she had UTI.  Ok to discharge with ectopic precautions/reasons to return to MAU reviewed.  Pt to f[facility]u at [facility] to discuss pregnancy options/start prenatal care.  Keflex 500 mg QID x 7 days Rx.   Pt stable at time of discharge.  ASSESSMENT 1. UTI (urinary tract infection) during pregnancy, first trimester   2. Abdominal pain during pregnancy, first trimester   3. Abdominal pain complicating pregnancy     PLAN Discharge home Allergies as of 03/05/2017   No Known Allergies     Medication List    STOP taking these medications   HYDROcodone-acetaminophen 5-325 MG tablet Commonly known as:  NORCO/VICODIN   ibuprofen 400 MG tablet Commonly known as:  ADVIL,MOTRIN     TAKE these medications    cephALEXin 500 MG capsule Commonly known as:  KEFLEX Take 1 capsule (500 mg total) by mouth 4 (four) times daily.      Follow-up Information    [facility] Follow up.   Why:  As needed, return to MAU as needed for emergencies Contact information: [address] [place] [address] [phone]           Sharen Counter Certified Nurse-Midwife 03/05/2017  2:03 PM

## 2017-03-06 LAB — HIV ANTIBODY (ROUTINE TESTING W REFLEX): HIV Screen 4th Generation wRfx: NONREACTIVE

## 2017-03-07 LAB — GC/CHLAMYDIA PROBE AMP (~~LOC~~) NOT AT ARMC
CHLAMYDIA, DNA PROBE: NEGATIVE
Neisseria Gonorrhea: NEGATIVE

## 2017-06-07 ENCOUNTER — Ambulatory Visit: Payer: Medicaid Other | Admitting: Obstetrics

## 2017-11-30 ENCOUNTER — Encounter: Payer: Self-pay | Admitting: Gastroenterology

## 2018-01-11 ENCOUNTER — Encounter: Payer: Self-pay | Admitting: Gastroenterology

## 2018-01-11 ENCOUNTER — Ambulatory Visit (INDEPENDENT_AMBULATORY_CARE_PROVIDER_SITE_OTHER): Payer: 59 | Admitting: Gastroenterology

## 2018-01-11 VITALS — BP 94/62 | HR 64 | Ht 63.0 in | Wt 150.2 lb

## 2018-01-11 DIAGNOSIS — K5909 Other constipation: Secondary | ICD-10-CM | POA: Diagnosis not present

## 2018-01-11 DIAGNOSIS — Z800 Family history of malignant neoplasm of digestive organs: Secondary | ICD-10-CM | POA: Diagnosis not present

## 2018-01-11 MED ORDER — PEG-KCL-NACL-NASULF-NA ASC-C 140 G PO SOLR: 1.0000 | ORAL | Status: DC

## 2018-01-11 NOTE — Patient Instructions (Signed)
If you are age 30 or older, your body mass index should be between 23-30. Your Body mass index is 26.62 kg/m. If this is out of the aforementioned range listed, please consider follow up with your Primary Care Provider.  If you are age 30 or younger, your body mass index should be between 19-25. Your Body mass index is 26.62 kg/m. If this is out of the aformentioned range listed, please consider follow up with your Primary Care Provider.   Your provider has requested that you go to the basement level for lab work before leaving today. Press "B" on the elevator. The lab is located at the first door on the left as you exit the elevator. CBC IBC Ferritin  You have been scheduled for a colonoscopy. Please follow written instructions given to you at your visit today.  Please pick up your prep supplies at the pharmacy within the next 1-3 days. If you use inhalers (even only as needed), please bring them with you on the day of your procedure. Your physician has requested that you go to www.[email or web address].com and enter the access code given to you at your visit today. This web site gives a general overview about your procedure. However, you should still follow specific instructions given to you by our office regarding your preparation for the procedure.  Please purchase the following medications over the counter and take as directed: Miralax, mix one capful of Miralax with 8 oz of liquid twice a day for three days prior to beginning your Colonoscopy Prep.  Thank you for choosing [facility]  Dr Amada JupiterHenry Danis III

## 2018-01-11 NOTE — Progress Notes (Signed)
[facility] Consult Note:  History: Morgan Quinn 01/11/2018  Referring physician: Allyn Kenner, [facility]  Reason for consult/chief complaint: Family  Hx Of Colon Cancer (father); Gastroesophageal Reflux; Gas (bloating, flatulence and belching); and Constipation (severe constipation, nausea and vomiting when really backed up)   Subjective  HPI:  This is a 32 year old woman referred by her gynecologist for a  family history of colon cancer.  Her father was diagnosed with colon  (? And prostate) cancer in his early 32 mid4s, and died in his late 47s of an MVA.  Morgan Quinn describes chronic constipation since the birth of her first child around 2012.  She may go up to 3 days without a BM, then will have crampy lower abdominal pain, bloating and nausea.  She tried what sounds like MiraLAX powder, but only took a few doses because she thought it would help have a bowel movement within several hours after taking it.  She does not have rectal bleeding, but sometimes feels as if there may be hemorrhoids because she feels something prolapse from the rectum when she stands for a long period of time. She denies dysphagia, nausea, vomiting, early satiety or weight loss.  Included with her referral records are labs from February 20 of this year that showed WBC 4.1, hemoglobin 8.4, hematocrit 29, MCV 70 and platelets 400,000. She says that she has been anemic "for many years".  I asked if her gynecologist felt it was due to menstrual blood loss, but she does not know.  She took oral iron tablets sometime in the past but had to stop because they were some constipation.  She has never seen a hematologist or had IV iron.  ROS:  Review of Systems  Constitutional: Negative for appetite change and unexpected weight change.  HENT: Negative for mouth sores and voice change.   Eyes: Negative for pain and redness.  Respiratory: Negative for cough and shortness of  breath.   Cardiovascular: Negative for chest pain and palpitations.  Genitourinary: Negative for dysuria and hematuria.  Musculoskeletal: Negative for arthralgias and myalgias.  Skin: Negative for pallor and rash.  Neurological: Negative for weakness and headaches.  Hematological: Negative for adenopathy.     Past Medical History: Past Medical History:  Diagnosis Date  . Anxiety   . Cervical dysplasia   . Depression    pp for 3 mos; was on medication  . Genital warts    h/o cryo   . Menorrhagia   . Panic attacks      Past Surgical History: Past Surgical History:  Procedure Laterality Date  . CERVICAL BIOPSY  W/ LOOP ELECTRODE EXCISION    . DENTAL SURGERY  July 2014, September 2013   wisdom tooth extraction; other oral surgery     Family History: Family History  Problem Relation Age of Onset  . Colon cancer Father 48       died at age 59   . Anemia Mother   . Prader-Willi syndrome Sister   . Diabetes Paternal Grandmother     Social History: Social History   Socioeconomic History  . Marital status: Single    Spouse name: Not on file  . Number of children: 4  . Years of education: Not on file  . Highest education level: Not on file  Occupational History  . Occupation: Therapist, art  Social Needs  . Financial resource strain: Not on file  . Food insecurity:    Worry: Not on file  Inability: Not on file  . Transportation needs:    Medical: Not on file    Non-medical: Not on file  Tobacco Use  . Smoking status: Never Smoker  . Smokeless tobacco: Never Used  Substance and Sexual Activity  . Alcohol use: Yes    Comment: social  . Drug use: No  . Sexual activity: Not Currently  Lifestyle  . Physical activity:    Days per week: Not on file    Minutes per session: Not on file  . Stress: Not on file  Relationships  . Social connections:    Talks on phone: Not on file    Gets together: Not on file    Attends religious service: Not on file     Active member of club or organization: Not on file    Attends meetings of clubs or organizations: Not on file    Relationship status: Not on file  Other Topics Concern  . Not on file  Social History Narrative  . Not on file    Allergies: No Known Allergies  Outpatient Meds: Current Outpatient Medications  Medication Sig Dispense Refill  . PEG-KCl-NaCl-NaSulf-Na Asc-C (PLENVU) 140 g SOLR Take 1 kit by mouth as directed. 1 each kit   No current facility-administered medications for this visit.       ___________________________________________________________________ Objective   Exam:  BP 94/62 (BP Location: Left Arm, Patient Position: Sitting, Cuff Size: Normal)   Pulse 64   Ht _0  (1.6 m) Comment: height measured without shoes  Wt 150 lb 4 oz (68.2 kg)   LMP 12/14/2016   Breastfeeding? No   BMI 26.62 kg/m    General: this is a(n) well-appearing woman  Eyes: sclera anicteric, no redness  ENT: oral mucosa moist without lesions, no cervical or supraclavicular lymphadenopathy, good dentition  CV: RRR without murmur, S1/S2, no JVD, no peripheral edema  Resp: clear to auscultation bilaterally, normal RR and effort noted  GI: soft, no tenderness, with active bowel sounds. No guarding or palpable organomegaly noted.  Skin; warm and dry, no rash or jaundice noted  Neuro: awake, alert and oriented x 3. Normal gross motor function and fluent speech  Labs:  12/03/17:  Hgb 8.4, MCV 70, plt 400  Assessment: Encounter Diagnoses  Name Primary?  . Family history of colon cancer in father Yes  . Chronic constipation     Her chronic constipation sounds possibly functional, may be pelvic floor dysfunction, less likely obstructive. Microcytic anemia is of unclear cause, presumably iron deficient.  She does not know if there is any family history of anemia or thalassemia.  She also does not see a primary care provider other than her gynecologist.  She does not have overt GI  blood loss; if it is iron deficiency, then I suspect it is from menstrual blood loss.  Plan:  Colonoscopy due to family history of colon cancer in her father. CBC, iron studies, TTG antibody and total IgA level to rule out celiac sprue.  She is agreeable, but says she will have to come back next week to have them drawn.  She was agreeable to a colonoscopy after discussion of procedure and risks.  The benefits and risks of the planned procedure were described in detail with the patient or (when appropriate) their health care proxy.  Risks were outlined as including, but not limited to, bleeding, infection, perforation, adverse medication reaction leading to cardiac or pulmonary decompensation, or pancreatitis (if ERCP).  The limitation of incomplete mucosal visualization  was also discussed.  No guarantees or warranties were given.  I recommended daily MiraLAX, since it needs to be taking chronically for best effect.  Thank you for the courtesy of this consult.  Please call me with any questions or concerns.  Nelida Meuse III  CC: Allyn Kenner, DO

## 2018-01-12 ENCOUNTER — Other Ambulatory Visit: Payer: Self-pay

## 2018-01-12 DIAGNOSIS — Z800 Family history of malignant neoplasm of digestive organs: Secondary | ICD-10-CM

## 2018-01-12 DIAGNOSIS — K5909 Other constipation: Secondary | ICD-10-CM

## 2018-01-29 ENCOUNTER — Encounter: Payer: Self-pay | Admitting: Gastroenterology

## 2018-02-06 ENCOUNTER — Telehealth: Payer: Self-pay | Admitting: Gastroenterology

## 2018-02-08 ENCOUNTER — Encounter: Payer: 59 | Admitting: Gastroenterology

## 2018-02-12 ENCOUNTER — Other Ambulatory Visit: Payer: Self-pay

## 2018-02-12 ENCOUNTER — Emergency Department (HOSPITAL_COMMUNITY): Payer: 59

## 2018-02-12 ENCOUNTER — Emergency Department (HOSPITAL_COMMUNITY)
Admission: EM | Admit: 2018-02-12 | Discharge: 2018-02-13 | Disposition: A | Discharge status: Home / Self Care | Payer: 59 | Attending: Emergency Medicine | Admitting: Emergency Medicine

## 2018-02-12 DIAGNOSIS — Y9389 Activity, other specified: Secondary | ICD-10-CM | POA: Diagnosis not present

## 2018-02-12 DIAGNOSIS — Y92009 Unspecified place in unspecified non-institutional (private) residence as the place of occurrence of the external cause: Secondary | ICD-10-CM | POA: Insufficient documentation

## 2018-02-12 DIAGNOSIS — R519 Headache, unspecified: Secondary | ICD-10-CM

## 2018-02-12 DIAGNOSIS — M549 Dorsalgia, unspecified: Secondary | ICD-10-CM | POA: Diagnosis not present

## 2018-02-12 DIAGNOSIS — Z041 Encounter for examination and observation following transport accident: Secondary | ICD-10-CM | POA: Diagnosis present

## 2018-02-12 DIAGNOSIS — R51 Headache: Secondary | ICD-10-CM | POA: Diagnosis not present

## 2018-02-12 DIAGNOSIS — M542 Cervicalgia: Secondary | ICD-10-CM | POA: Diagnosis not present

## 2018-02-12 DIAGNOSIS — Y999 Unspecified external cause status: Secondary | ICD-10-CM | POA: Diagnosis not present

## 2018-02-12 MED ORDER — IBUPROFEN 800 MG PO TABS
800.0000 mg | ORAL_TABLET | Freq: Once | ORAL | Status: AC
  Administered 2018-02-13: 800 mg via ORAL
  Filled 2018-02-12: qty 1

## 2018-02-12 MED ORDER — METHOCARBAMOL 500 MG PO TABS: 500.0000 mg | ORAL_TABLET | Freq: Two times a day (BID) | ORAL | 0 refills | Status: DC

## 2018-02-12 MED ORDER — ACETAMINOPHEN 500 MG PO TABS
1000.0000 mg | ORAL_TABLET | Freq: Once | ORAL | Status: AC
  Administered 2018-02-13: 1000 mg via ORAL
  Filled 2018-02-12: qty 2
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
## 2018-02-12 NOTE — Discharge Instructions (Signed)
The CT scan of your head and neck were reassuring. Xray of your back was reassuring.   Please take ibuprofen  every 6hrs at home for headache. I have written you prescription for muscle relaxer medicine called Robaxin.  This medicine can make you drowsy so please do not drive or work while taking it.  I written you a work note for the next 2 days.  Follow-up with your regular doctor in a week if your symptoms are not improving.  Please return to the emergency department if you have any new or concerning symptoms like worsening headache with vomiting, trouble with your vision.
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
## 2018-03-01 ENCOUNTER — Encounter: Payer: 59 | Admitting: Gastroenterology

## 2018-03-29 ENCOUNTER — Telehealth: Payer: Self-pay | Admitting: Gastroenterology

## 2018-03-29 ENCOUNTER — Encounter: Payer: 59 | Admitting: Gastroenterology

## 2018-03-29 NOTE — Telephone Encounter (Signed)
This patient did not show for colonoscopy today, and I do not see a call from patient to cancel.  She was a late cancel for prior colonoscopy.  Please assess no-show charge, no reschedule of procedure.

## 2018-03-30 ENCOUNTER — Telehealth: Payer: Self-pay | Admitting: *Deleted

## 2018-03-30 NOTE — Telephone Encounter (Signed)
  Follow up Call-  No flowsheet data found.   Patient questions:  Patient did not have the procedure.

## 2018-08-09 ENCOUNTER — Inpatient Hospital Stay: Payer: 59 | Attending: Hematology and Oncology | Admitting: Hematology and Oncology

## 2018-08-09 ENCOUNTER — Inpatient Hospital Stay: Payer: 59

## 2018-08-09 ENCOUNTER — Telehealth: Payer: Self-pay

## 2018-08-09 ENCOUNTER — Encounter: Payer: Self-pay | Admitting: Hematology and Oncology

## 2018-08-09 VITALS — BP 99/64 | HR 80 | Temp 98.3°F | Resp 18 | Ht 63.0 in | Wt 156.8 lb

## 2018-08-09 DIAGNOSIS — D500 Iron deficiency anemia secondary to blood loss (chronic): Secondary | ICD-10-CM | POA: Insufficient documentation

## 2018-08-09 DIAGNOSIS — G8929 Other chronic pain: Secondary | ICD-10-CM | POA: Diagnosis not present

## 2018-08-09 DIAGNOSIS — M545 Low back pain: Secondary | ICD-10-CM | POA: Insufficient documentation

## 2018-08-09 DIAGNOSIS — K5900 Constipation, unspecified: Secondary | ICD-10-CM | POA: Diagnosis not present

## 2018-08-09 DIAGNOSIS — D649 Anemia, unspecified: Secondary | ICD-10-CM

## 2018-08-09 DIAGNOSIS — D509 Iron deficiency anemia, unspecified: Secondary | ICD-10-CM | POA: Diagnosis present

## 2018-08-09 DIAGNOSIS — Z800 Family history of malignant neoplasm of digestive organs: Secondary | ICD-10-CM | POA: Diagnosis not present

## 2018-08-09 DIAGNOSIS — K649 Unspecified hemorrhoids: Secondary | ICD-10-CM | POA: Diagnosis not present

## 2018-08-09 LAB — CBC WITH DIFFERENTIAL (CANCER CENTER ONLY)
ABS IMMATURE GRANULOCYTES: 0.01 10*3/uL (ref 0.00–0.07)
BASOS PCT: 0 %
Basophils Absolute: 0 10*3/uL (ref 0.0–0.1)
EOS ABS: 0.1 10*3/uL (ref 0.0–0.5)
Eosinophils Relative: 2 %
HEMATOCRIT: 30.4 % — AB (ref 36.0–46.0)
Hemoglobin: 8.7 g/dL — ABNORMAL LOW (ref 12.0–15.0)
IMMATURE GRANULOCYTES: 0 %
Lymphocytes Relative: 51 %
Lymphs Abs: 2.3 10*3/uL (ref 0.7–4.0)
MCH: 21.3 pg — ABNORMAL LOW (ref 26.0–34.0)
MCHC: 28.6 g/dL — ABNORMAL LOW (ref 30.0–36.0)
MCV: 74.3 fL — AB (ref 80.0–100.0)
MONO ABS: 0.4 10*3/uL (ref 0.1–1.0)
MONOS PCT: 8 %
NEUTROS PCT: 39 %
Neutro Abs: 1.7 10*3/uL (ref 1.7–7.7)
PLATELETS: 331 10*3/uL (ref 150–400)
RBC: 4.09 MIL/uL (ref 3.87–5.11)
RDW: 17.2 % — AB (ref 11.5–15.5)
WBC Count: 4.5 10*3/uL (ref 4.0–10.5)
nRBC: 0 % (ref 0.0–0.2)

## 2018-08-09 LAB — COMPREHENSIVE METABOLIC PANEL
ALT: 11 U/L (ref 0–44)
ANION GAP: 6 (ref 5–15)
AST: 18 U/L (ref 15–41)
Albumin: 3.5 g/dL (ref 3.5–5.0)
Alkaline Phosphatase: 43 U/L (ref 38–126)
BUN: 14 mg/dL (ref 6–20)
CHLORIDE: 108 mmol/L (ref 98–111)
CO2: 27 mmol/L (ref 22–32)
Calcium: 8.6 mg/dL — ABNORMAL LOW (ref 8.9–10.3)
Creatinine, Ser: 0.81 mg/dL (ref 0.44–1.00)
GFR calc non Af Amer: >60 mL/min (ref >60)
Glucose, Bld: 86 mg/dL (ref 70–99)
POTASSIUM: 4.3 mmol/L (ref 3.5–5.1)
SODIUM: 141 mmol/L (ref 135–145)
Total Bilirubin: 0.6 mg/dL (ref 0.3–1.2)
Total Protein: 7.1 g/dL (ref 6.5–8.1)

## 2018-08-09 LAB — RETIC PANEL
IMMATURE RETIC FRACT: 24.7 % — AB (ref 2.3–15.9)
RBC.: 4.04 MIL/uL (ref 3.87–5.11)
RETIC CT PCT: 0.9 % (ref 0.4–3.1)
RETICULOCYTE HEMOGLOBIN: 20 pg — AB (ref >27.9)
Retic Count, Absolute: 37.6 10*3/uL (ref 19.0–186.0)

## 2018-08-09 LAB — LACTATE DEHYDROGENASE: LDH: 163 U/L (ref 98–192)

## 2018-08-09 NOTE — Telephone Encounter (Signed)
Printed avs and check in for lab add on. Per 10/31 verbal los

## 2018-08-09 NOTE — Patient Instructions (Signed)
We discussed in detail the results of your previous laboratory studies which suggest iron deficiency anemia.  Arrangements are underway, following the results of your blood work today, to schedule an iron infusion at the [facility] on Thursday, November 7.  You will likely be called for a time on that particular day.  It is recommended that you start vitamin B 50 complex: 1 capsule once daily in the setting of anticipated iron replacement.  Barring any unforeseen complications, your next scheduled lab work and office visit is on December 5.  Please to urgency to call should any questions or problems arise in the interim.  Thank you for choosing [place]! Debbe Odea, MD Hematology/Oncology [phone]

## 2018-08-09 NOTE — Progress Notes (Signed)
[facility] Initial Consultation  Patient Name:  Morgan Quinn  DOB: 12/11/1985   Date of Service: August 09, 2018  Referring Provider: Allyn Kenner, [address] [place], [place] [address]   Consulting Physician: Henreitta Leber, MD Hematology/Oncology  Reason for Referral: In the setting of hypochromic and microcytic symptomatic anemia, intolerant to oral iron, in the premenopausal setting, she presents now for further diagnostic and therapeutic recommendations.  History Present Illness: Morgan Quinn is a 32 year old gravida 4 para 4 resident of [place], originally from [place], whose past medical history is significant for anxiety disorder/panic attacks; menorrhagia; genital warts, status post cryosurgery; cervical dysplasia, status post LEEP after most recent pregnancy; and long-standing anemia.  She has no primary care provider.  Her referring physician is Dr. Allyn Kenner, obstetrics and gynecology. She is alone at this first visit.  Following her most recent pregnancy in 2016 she was aware that she was anemic.  She has been taking oral iron sporadically.  Although she was on oral contraception in the distant past, she is taking no hormonal or using contraceptive device at present.  Her menses are on a monthly basis.  Her last menstruation began on October 24.  Her periods have been heavy, lasting 1-7 days with 5 days of heavy flow. She is single. 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  Because of her anemia, she was referred to Dr. Wilfrid Lund, gastroenterology. On January 11, 2018 she was seen initially and a colonoscopy was planned.  Likewise a work-up and evaluation to rule out celiac sprue was obtained.    Because her father had colon cancer in his mid 51s, but died in his late 24s in a motor vehicle accident, a colonoscopy was planned. Although a presumptive diagnosis of iron deficiency was  entertained, a hematologic work-up was recommended. Over the past several years, she has steadily become more easily fatigued and tired.  Both her appetite and weight remain stable.  She has no unusual headache, dizziness, lightheadedness, or syncope.  She has had several episodes where she "felt like passing out."  She has not fallen.  While she tried multivitamins, there was no benefit.  She has been on ferrous sulfate sporadically, but experienced significant constipation. On iron, she moves her bowels every 3 days.  She is uncomfortable. She has no rash or itching.  She denies visual changes or hearing deficit.    She has no cough, sore throat, or orthopnea.  She denies dyspnea either at rest or on minimal exertion.  She has no pain or difficulty in swallowing.  No fever, shaking chills, sweats, or flulike symptoms are reported.  She has no heartburn or indigestion.  There is no nausea, vomiting, or diarrhea.  She is constipated.  She has no melena or bright red blood per rectum.  She does have hemorrhoids since the birth of her first child.  No urinary frequency, urgency, hematuria, dysuria reported.  She has mild swelling of her ankles which recede overnight.  She has chronic lower back pain unchanged over her usual baseline.  She has intermittent tingling and soreness entheses aching) in the anterior distal lower extremities.  It is with this background she presents now for further diagnostic and therapeutic recommendations in the setting of hypochromic and microcytic anemia suggestive of iron deficiency.  Past Medical History:  Diagnosis Date  . Anxiety   . Cervical dysplasia   . Depression    pp for 3  mos; was on medication  . Genital warts    h/o cryo   . Menorrhagia   . Panic attacks    Past Surgical History:  Procedure Laterality Date  . CERVICAL BIOPSY  W/ LOOP ELECTRODE EXCISION    . DENTAL SURGERY  July 2014, September 2013   wisdom tooth extraction; other oral surgery    Gynecologic History: Her menarche was at age 36 years. Her last menses was on August 02, 2018. Her menstruation generally last 7 days with 5 heavy days. She is a gravida 4 para 4. Her first pregnancy was at age 53 years. Her last pregnancy was at age 62 years. In her teens she was on oral contraceptives for nearly 10 years. Her last screening mammogram was at least 2 years ago. She has never had a breast biopsy. Her last Pap smear was approximately 6-12 months earlier.  Family History  Problem Relation Age of Onset  . Colon cancer Father 42       died at age 52   . Anemia Mother   . Prader-Willi syndrome Sister   . Diabetes Paternal Grandmother   Mother: Age 58 years: HTN. Father: Deceased: Age 69 years: Colon cancer/MVA. Brothers (3): No medical problems. Sisters (3): No medical problems  Social History   Socioeconomic History  . Marital status: Single    Spouse name: Not on file  . Number of children: 4  . Years of education: Not on file  . Highest education level: Not on file  Occupational History  . Occupation: Therapist, art  Social Needs  . Financial resource strain: Not on file  . Food insecurity:    Worry: Not on file    Inability: Not on file  . Transportation needs:    Medical: Not on file    Non-medical: Not on file  Tobacco Use  . Smoking status: Never Smoker  . Smokeless tobacco: Never Used  Substance and Sexual Activity  . Alcohol use: Yes    Comment: social  . Drug use: No  . Sexual activity: Not on file  Lifestyle  . Physical activity:    Days per week: Not on file    Minutes per session: Not on file  . Stress: Not on file  Relationships  . Social connections:    Talks on phone: Not on file    Gets together: Not on file    Attends religious service: Not on file    Active member of club or organization: Not on file    Attends meetings of clubs or organizations: Not on file    Relationship status: Not on file  . Intimate partner  violence:    Fear of current or ex partner: Not on file    Emotionally abused: Not on file    Physically abused: Not on file    Forced sexual activity: Not on file  Other Topics Concern  . Not on file  Social History Narrative  . Not on file  Dannika Hilgeman is single, never married. She has 4 healthy children. She is a lifetime non-smoker. Her alcohol intake is infrequent, never heavy. She reports no recreational drug use.  Transfusion History: No prior transfusion  Exposure History: She has no known exposure to toxic chemicals, radiation, or pesticides.  Allergies:  She has no known medical allergies She has no food allergies She has nonspecific seasonal allergies  Current Outpatient Medications on File Prior to Visit  Medication Sig  . methocarbamol (ROBAXIN) 500 MG tablet Take  1 tablet (500 mg total) by mouth 2 (two) times daily.  Marland Kitchen PEG-KCl-NaCl-NaSulf-Na Asc-C (PLENVU) 140 g SOLR Take 1 kit by mouth as directed. (Patient not taking: Reported on 02/12/2018)   No current facility-administered medications on file prior to visit.     Review of Systems: Constitutional: No fever, sweats, or shaking chills.  No appetite or weight deficit. Skin: No rash, scaling, sores, lumps, or jaundice. HEENT: No visual changes or hearing deficit. Pulmonary: No unusual cough, sore throat, or orthopnea. Breasts: No complaints. Cardiovascular: No coronary artery disease, angina, or myocardial infarction.  No cardiac dysrhythmia, essential hypertension, or dyslipidemia. Gastrointestinal: No indigestion, dysphagia, abdominal pain, or diarrhea.  Chronic constipation.  No recent change in bowel habits.  No nausea or vomiting.  No melena or bright red blood per rectum.  Family history of colon cancer at young age (father: Mid 48s). Genitourinary: No urinary frequency, urgency, hematuria, or dysuria; genital warts, resolved; cervical dysplasia status post LEEP. Musculoskeletal: No arthralgias or  myalgias; chronic lower back pain; no joint swelling, pain, or instability. Hematologic: No bleeding tendency or easy bruisability; hypochromic and microcytic anemia. Endocrine: No intolerance to hot or cold; no thyroid disease or diabetes mellitus. Vascular: No peripheral arterial or venous thromboembolic disease. Psychological: Anxiety disorder/panic attack; chronic depression without suicidal ideation. No mood changes. Neurological: Positional dizziness.  No lightheadedness, syncope, or near syncopal episodes; no numbness or tingling in the fingers or toes.  Physical Examination: Vital Signs: Body surface area is 1.78 meters squared.  Vitals:   08/09/18 1442  BP: 99/64  Pulse: 80  Resp: 18  Temp: 98.3 F (36.8 C)  SpO2: 100%    Filed Weights   08/09/18 1442  Weight: 156 lb 12.8 oz (71.1 kg)   ECOG PERFORMANCE STATUS: 1 Constitutional:  Tarra Pence is a fully nourished and developed African-American.  She looks age appropriate. She is friendly and cooperative without respiratory compromise at rest. Skin: No rashes, scaling, dryness, jaundice, or itching; pallor. HEENT: Head is normocephalic and atraumatic.  Pupils are equal round and reactive to light and accommodation.  Sclerae are anicteric.  Conjunctivae are pale.  No sinus tenderness nor oropharyngeal lesions.  Lips without cracking or peeling; tongue without mass, inflammation, or nodularity.  Mucous membranes are moist. Neck: Supple and symmetric.  No jugular venous distention or thyromegaly.  Trachea is midline. Lymphatics: No cervical or supraclavicular lymphadenopathy.  No epitrochlear, axillary, or inguinal lymphadenopathy is appreciated. Respiratory/chest: Thorax is symmetrical.  Breath sounds are clear to auscultation and percussion.  Normal excursion and respiratory effort. Back: Symmetric without deformity or tenderness. Cardiovascular: Heart rate and rhythm are regular without murmurs, gallops, or  rubs. Gastrointestinal: Abdomen is soft, nontender; no organomegaly.  Bowel sounds are normoactive.  No masses are appreciated. Extremities: In the lower extremities, there is no asymmetric swelling, erythema, tenderness, or cord formation.  No clubbing, cyanosis, nor edema. Hematologic: No petechiae, hematomas, or ecchymoses. Psychological:  She is oriented to person, place, and time; normal affect, memory, and cognition. Neurological: There are no gross neurologic deficits.  Laboratory Results: I have reviewed the data as listed:  Ref Range & Units 16:01 (08/09/18) 56yrago (03/05/17) 235yrgo (08/13/15) 2y66yro (08/12/15) 20yr20yr (10/25/13)  WBC Count 4.0 - 10.5 K/uL 4.5  5.3  11.8High   8.0  11.8High    RBC 3.87 - 5.11 MIL/uL 4.09  4.04  3.82Low   4.10  3.87   Hemoglobin 12.0 - 15.0 g/dL 8.7Low   9.4Low  9.3Low   10.3Low   9.4Low    Comment: Reticulocyte Hemoglobin testing  may be clinically indicated,  consider ordering this additional  test [id number]   HCT 36.0 - 46.0 % 30.4Low   29.7Low   30.1Low   31.7Low   30.1Low    MCV 80.0 - 100.0 fL 74.3Low   73.5Low  R 78.8 R 77.3Low  R 77.8Low  R  MCH 26.0 - 34.0 pg 21.3Low   23.3Low   24.3Low   25.1Low   24.3Low    MCHC 30.0 - 36.0 g/dL 28.6Low   31.6  30.9  32.5  31.2   RDW 11.5 - 15.5 % 17.2High   17.8High   17.5High   17.3High   15.6High    Platelet Count 150 - 400 K/uL 331  332  220  258  246   nRBC 0.0 - 0.2 % 0.0       Neutrophils Relative % % 39       Neutro Abs 1.7 - 7.7 K/uL 1.7       Lymphocytes Relative % 51       Lymphs Abs 0.7 - 4.0 K/uL 2.3       Monocytes Relative % 8       Monocytes Absolute 0.1 - 1.0 K/uL 0.4       Eosinophils Relative % 2       Eosinophils Absolute 0.0 - 0.5 K/uL 0.1       Basophils Relative % 0       Basophils Absolute 0.0 - 0.1 K/uL 0.0       Immature Granulocytes % 0       Abs Immature Granulocytes 0.00 - 0.07 K/uL 0.01       Comment: Performed at [facility], [place]  [address] [place], [place] [address]  Resulting Agency  [facility]      Ref Range & Units 15:58  Retic Ct Pct 0.4 - 3.1 % 0.9   RBC. 3.87 - 5.11 MIL/uL 4.04   Retic Count, Absolute 19.0 - 186.0 K/uL 37.6   Immature Retic Fract 2.3 - 15.9 % 24.7High    Reticulocyte Hemoglobin >27.9 pg 20.0Low      Ref Range & Units 15:58  Sodium 135 - 145 mmol/L 141   Potassium 3.5 - 5.1 mmol/L 4.3   Chloride 98 - 111 mmol/L 108   CO2 22 - 32 mmol/L 27   Glucose, Bld 70 - 99 mg/dL 86   BUN 6 - 20 mg/dL 14   Creatinine, Ser 0.44 - 1.00 mg/dL 0.81   Calcium 8.9 - 10.3 mg/dL 8.6Low    Total Protein 6.5 - 8.1 g/dL 7.1   Albumin 3.5 - 5.0 g/dL 3.5   AST 15 - 41 U/L 18   ALT 0 - 44 U/L 11   Alkaline Phosphatase 38 - 126 U/L 43   Total Bilirubin 0.3 - 1.2 mg/dL 0.6   GFR calc non Af Amer >60 mL/min >60   GFR calc Af Amer     Diagnostic/Imaging Studies: July 13, 2016 CT CHEST WITHOUT CONTRAST  TECHNIQUE: Multidetector CT imaging of the chest was performed following the standard protocol without IV contrast.  COMPARISON:  Chest radiograph dated 08/21/2006  FINDINGS: Evaluation of this exam is limited in the absence of intravenous contrast. Evaluation is also limited due to streak artifact caused by patient's arms.  Cardiovascular: The thoracic aorta and central pulmonary arteries are grossly unremarkable  on this noncontrast study. There is no cardiomegaly or pericardial effusion. There is hypoattenuation of the cardiac blood pool suggestive of a degree of anemia. Clinical correlation is recommended. Residual thymic tissue noted in the anterior mediastinum.  Mediastinum/Nodes: No definite hilar or mediastinal adenopathy. The esophagus is grossly unremarkable. No thyroid nodules identified.  Lungs/Pleura: The lungs are clear. There is no pleural effusion or pneumothorax. The central airways are patent.  Upper Abdomen: No acute  abnormality.  Musculoskeletal: There is no axillary adenopathy. The chest wall soft tissues appear unremarkable. No acute osseous pathology.  IMPRESSION: No acute intrathoracic pathology.  Anner Crete M.D. 07/13/2016 02:47  Summary/Assessment: In the setting of hypochromic and microcytic symptomatic anemia, intolerant to oral iron, in the premenopausal setting, she presents now for further diagnostic and therapeutic recommendations.  Following her most recent pregnancy in 2016 she was aware that she was anemic.  She has been taking oral iron sporadically.  Although she was on oral contraception in the distant past, she is taking no hormonal or using contraceptive device at present.  Her menses are on a monthly basis.  Her last menstruation began on October 24.  Her periods have been heavy, lasting 1-7 days with 5 days of heavy flow. She is single. 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  Because of her anemia, she was referred to Dr. Wilfrid Lund, gastroenterology. On January 11, 2018 she was seen initially and a colonoscopy was planned.  Likewise a work-up and evaluation to rule out celiac sprue was obtained.    Because her father had colon cancer in his mid 10s, but died in his late 16s in a motor vehicle accident, a colonoscopy was planned. Although a presumptive diagnosis of iron deficiency was entertained, a hematologic work-up was recommended. Over the past several years, she has steadily become more easily fatigued and tired.  Both her appetite and weight remain stable.  She has no unusual headache, dizziness, lightheadedness, or syncope.  She has had several episodes where she "felt like passing out."  She has not fallen.  While she tried multivitamins, there was no benefit.  She has been on ferrous sulfate sporadically, but experienced significant constipation. On iron, she moves her bowels every 3 days.  She is uncomfortable. She has no rash or itching.  She denies visual  changes or hearing deficit.    She has no cough, sore throat, or orthopnea.  She denies dyspnea either at rest or on minimal exertion.  She has no pain or difficulty in swallowing.  No fever, shaking chills, sweats, or flulike symptoms are reported.  She has no heartburn or indigestion.  There is no nausea, vomiting, or diarrhea.  She is constipated.  She has no melena or bright red blood per rectum.  She does have hemorrhoids since the birth of her first child.  No urinary frequency, urgency, hematuria, dysuria reported.  She has mild swelling of her ankles which recede overnight.  She has chronic lower back pain unchanged over her usual baseline.  She has intermittent tingling and soreness entheses aching) in the anterior distal lower extremities.    Her other comorbid problems include anxiety disorder/panic attacks; menorrhagia; genital warts, status post cryosurgery; cervical dysplasia, status post LEEP after most recent pregnancy; and long-standing anemia.    Recommendation/Plan: I had a lengthy discussion with Wilton regarding her prior history, likely hypochromic and microcytic iron deficiency anemia due to menorrhagia.  I was however supportive and recommended a diagnostic colonoscopy given her  family history.  Laboratory studies were obtained to exclude an underlying iron deficiency, hemolytic process, or hemoglobinopathy to account for her hypochromic and microcytic anemia.  Those results are pending.  A complete blood count from today shows hemoglobin 8.7 hematocrit 30.4 MCV 74.3 MCH 21.3 RDW 17.2 WBC 4.5 with 39% neutrophils 51% lymphocytes 8% monocytes 2% eosinophils; platelets 331,000.  A reticulocyte panel shows 0.9%.  Although this generally indicates an inappropriate bone marrow response, severe iron deficiency can suppress the production of early red blood cells.  The reticulocyte hemoglobin is 20.0.  Those findings are consistent with iron deficiency anemia.  The classic iron studies  are pending.  At her convenience, parenteral iron will be administered on November 7 (ferumoxytol).  She was advised to start vitamin B 50 complex: 1 tablet once daily with food in anticipation of brisk erythropoiesis.  Due to constipation, she was advised to stop iron by mouth.  Because she is still constipated, it was recommended that she take daily MiraLAX: 17 g in 8 ounces of water for at least 3-5 days; and thereafter as needed.    Barring any unforeseen complications, her next scheduled doctor visit with laboratory studies is on December 5.  She was advised to call us in the interim should any new or untoward problems arise.  The total time spent discussing her prior history, importance, rationale, and expectation of parenteral iron and the logistics was 60 minutes.  At least 50% of that time was spent face-to-face in discussion, reviewing outside records, counseling, and answering questions. There was ample time allotted to answer all questions.  This note was dictated using voice activated technology/software. Unfortunately, typographical errors are not uncommon, and transcription is subject to mistakes and regrettably misinterpretation.  If necessary, clarification of the above information can be discussed with me at any time.  Thank you Drs Rogue Bussing and Danis for allowing my participation in the care of Golovin. I will keep you closely informed as the results of her complete laboratory data become available.  Please do not hesitate to call should any questions arise regarding this initial consultation and discussion.  FOLLOW UP: AS DIRECTED   cc:         Allyn Kenner, DO               Wilfrid Lund III MD   Henreitta Leber, MD  Hematology/Oncology [place] [address] [place], [place] [address] Office: [phone] IRSW: [phone]

## 2018-08-10 ENCOUNTER — Telehealth: Payer: Self-pay | Admitting: Hematology and Oncology

## 2018-08-10 LAB — HEMOGLOBINOPATHY EVALUATION
HGB A: 98.4 % (ref 96.4–98.8)
HGB C: 0 %
HGB F QUANT: 0 % (ref 0.0–2.0)
HGB S QUANTITAION: 0 %
HGB VARIANT: 0 %
Hgb A2 Quant: 1.6 % — ABNORMAL LOW (ref 1.8–3.2)

## 2018-08-10 LAB — FERRITIN

## 2018-08-10 LAB — IRON AND TIBC
Iron: 25 ug/dL — ABNORMAL LOW (ref 41–142)
SATURATION RATIOS: 5 % — AB (ref 21–57)
TIBC: 485 ug/dL — AB (ref 236–444)
UIBC: 460 ug/dL — AB (ref 120–384)

## 2018-08-10 LAB — HAPTOGLOBIN: Haptoglobin: 77 mg/dL (ref 34–200)

## 2018-08-10 NOTE — Telephone Encounter (Signed)
Scheduled appt per 11/1 sch message - pt is aware of appt ./  

## 2018-08-13 ENCOUNTER — Other Ambulatory Visit: Payer: Self-pay | Admitting: Hematology and Oncology

## 2018-08-16 ENCOUNTER — Inpatient Hospital Stay: Payer: 59 | Attending: Hematology and Oncology

## 2018-08-16 VITALS — BP 103/61 | HR 67 | Temp 98.0°F | Resp 16

## 2018-08-16 DIAGNOSIS — D500 Iron deficiency anemia secondary to blood loss (chronic): Secondary | ICD-10-CM

## 2018-08-16 DIAGNOSIS — D509 Iron deficiency anemia, unspecified: Secondary | ICD-10-CM | POA: Diagnosis not present

## 2018-08-16 MED ORDER — SODIUM CHLORIDE 0.9 % IV SOLN
510.0000 mg | Freq: Once | INTRAVENOUS | Status: AC
  Administered 2018-08-16: 510 mg via INTRAVENOUS
  Filled 2018-08-16: qty 17

## 2018-08-16 MED ORDER — SODIUM CHLORIDE 0.9 % IV SOLN
Freq: Once | INTRAVENOUS | Status: AC
  Administered 2018-08-16: 10:00:00 via INTRAVENOUS
  Filled 2018-08-16: qty 250

## 2018-08-16 NOTE — Progress Notes (Signed)
VSS after 30 minute observation period; pt with no abnormal s/s after first feraheme infusion.

## 2018-08-16 NOTE — Patient Instructions (Signed)

## 2018-09-09 ENCOUNTER — Other Ambulatory Visit: Payer: Self-pay | Admitting: Hematology and Oncology

## 2018-09-09 DIAGNOSIS — D500 Iron deficiency anemia secondary to blood loss (chronic): Secondary | ICD-10-CM

## 2018-09-13 ENCOUNTER — Inpatient Hospital Stay: Payer: 59

## 2018-09-13 ENCOUNTER — Inpatient Hospital Stay: Payer: 59 | Attending: Hematology and Oncology | Admitting: Hematology and Oncology

## 2018-10-10 NOTE — L&D Delivery Note (Signed)
Patient was C/C/+1 and pushed for 5 minutes with epidural.    NSVD  female infant, Apgars 8,9, weight P.   The patient had no lacerations. Fundus was firm. EBL was expected amount. Placenta was delivered intact. Vagina was clear.  Delayed cord clamping done for 30-60 seconds while warming baby. Baby was vigorous and doing skin to skin with mother.  Morgan Quinn

## 2018-10-19 ENCOUNTER — Telehealth: Payer: Self-pay | Admitting: Gastroenterology

## 2018-10-19 NOTE — Telephone Encounter (Signed)
Patient is scheduled for 01/16 for rectal bleeding but wants to know what to do to stop it until then

## 2018-10-19 NOTE — Telephone Encounter (Signed)
The pt states since this morning she has had 2 episodes of BRB filling the toilet independent of stool.  She has an appt on 1/16 with Dr Myrtie Neither for rectal bleeding.  She was advised to go to the ED for eval due to the amount of bleeding.  The pt has been advised of the information and verbalized understanding.

## 2018-10-25 ENCOUNTER — Ambulatory Visit: Payer: Self-pay | Admitting: Gastroenterology

## 2018-10-25 NOTE — Progress Notes (Deleted)
     Morgan Quinn  Chief Complaint: Rectal bleeding  Subjective  History:  *** I saw her April 2019 for chronic constipation and reported history of colon cancer in her father.  She was scheduled for a colonoscopy shortly afterwards, but canceled on short notice.  Colonoscopy was rescheduled for March 29, 2018, but she was a no-show with no call. She also has a chronic anemia followed by her gynecologist.  At the time she saw me, [name] did not have a primary care doctor and had not seen hematology.  She called our office 10 days ago experiencing rectal bleeding and was given a visit today.  ROS: Cardiovascular:  no chest pain Respiratory: no dyspnea  The patient's Past Medical, Family and Social History were reviewed and are on file in the EMR.  Objective:  Med list reviewed  Current Outpatient Medications:  .  methocarbamol (ROBAXIN) 500 MG tablet, Take 1 tablet (500 mg total) by mouth 2 (two) times daily., Disp: 20 tablet, Rfl: 0 .  PEG-KCl-NaCl-NaSulf-Na Asc-C (PLENVU) 140 g SOLR, Take 1 kit by mouth as directed. (Patient not taking: Reported on 02/12/2018), Disp: 1 each, Rfl: kit   Vital signs in last 24 hrs: There were no vitals filed for this visit.  Physical Exam  ***  HEENT: sclera anicteric, oral mucosa moist without lesions  Neck: supple, no thyromegaly, JVD or lymphadenopathy  Cardiac: RRR without murmurs, S1S2 heard, no peripheral edema  Pulm: clear to auscultation bilaterally, normal RR and effort noted  Abdomen: soft, *** tenderness, with active bowel sounds. No guarding or palpable hepatosplenomegaly.  Skin; warm and dry, no jaundice or rash  Recent Labs:  CBC Latest Ref Rng & Units 08/09/2018 03/05/2017 08/13/2015  WBC 4.0 - 10.5 K/uL 4.5 5.3 11.8(H)  Hemoglobin 12.0 - 15.0 g/dL 8.7(L) 9.4(L) 9.3(L)  Hematocrit 36.0 - 46.0 % 30.4(L) 29.7(L) 30.1(L)  Platelets 150 - 400 K/uL 331 332 220   MCV 74  08/09/2018: Iron level 25, TIBC  85 (5% saturation), ferritin less than 4  Radiologic studies:    _0 @ Assessment: No diagnosis found.    Plan:    Total time *** minutes, over half spent face-to-face with patient in counseling and coordination of care.   Nelida Meuse III

## 2018-11-08 ENCOUNTER — Ambulatory Visit: Payer: Medicaid Other | Admitting: Gastroenterology

## 2018-11-10 ENCOUNTER — Encounter (HOSPITAL_COMMUNITY): Payer: Self-pay | Admitting: *Deleted

## 2018-11-10 ENCOUNTER — Inpatient Hospital Stay (HOSPITAL_COMMUNITY)
Admission: AD | Admit: 2018-11-10 | Discharge: 2018-11-10 | Disposition: A | Discharge status: Home / Self Care | Payer: Managed Care, Other (non HMO) | Source: Ambulatory Visit | Attending: Obstetrics and Gynecology | Admitting: Obstetrics and Gynecology

## 2018-11-10 DIAGNOSIS — N898 Other specified noninflammatory disorders of vagina: Secondary | ICD-10-CM | POA: Diagnosis present

## 2018-11-10 DIAGNOSIS — Z3202 Encounter for pregnancy test, result negative: Secondary | ICD-10-CM | POA: Diagnosis not present

## 2018-11-10 DIAGNOSIS — R109 Unspecified abdominal pain: Secondary | ICD-10-CM | POA: Diagnosis not present

## 2018-11-10 DIAGNOSIS — Z9189 Other specified personal risk factors, not elsewhere classified: Secondary | ICD-10-CM | POA: Diagnosis not present

## 2018-11-10 LAB — URINALYSIS, ROUTINE W REFLEX MICROSCOPIC
Bilirubin Urine: NEGATIVE
Glucose, UA: NEGATIVE mg/dL
Hgb urine dipstick: NEGATIVE
Ketones, ur: NEGATIVE mg/dL
Leukocytes, UA: NEGATIVE
Nitrite: NEGATIVE
Protein, ur: NEGATIVE mg/dL
SPECIFIC GRAVITY, URINE: 1.01 (ref 1.005–1.030)
pH: 7 (ref 5.0–8.0)

## 2018-11-10 LAB — WET PREP, GENITAL
CLUE CELLS WET PREP: NONE SEEN
Sperm: NONE SEEN
Trich, Wet Prep: NONE SEEN
Yeast Wet Prep HPF POC: NONE SEEN

## 2018-11-10 LAB — POCT PREGNANCY, URINE: Preg Test, Ur: NEGATIVE

## 2018-11-10 MED ORDER — LEVONORGESTREL 1.5 MG PO TABS: 1.5000 mg | ORAL_TABLET | Freq: Once | ORAL | 0 refills | Status: AC

## 2018-11-10 NOTE — MAU Provider Note (Signed)
History     CSN: [id number]  Arrival date and time: 11/10/18 1521   First Provider Initiated Contact with Patient 11/10/18 1727      Chief Complaint  Patient presents with  . Abdominal Pain  . Vaginal Discharge  . Diarrhea   Morgan Quinn is a 33 y.o. female who presents for Abdominal Pain; Vaginal Discharge; and Diarrhea.  She states she has noticed some greenish-yellow vaginal discharge that started about 3 days ago.  She denies other vaginal concerns including odor, itching, burning, or irritation.  She reports abdominal pain that is a "dry dull pain" that is persistent in nature.  She reports taking tylenol without relief of symptoms and notes some radiation to her lower back.  Patient states the pain is located in the pelvic area and "mimics cramping, but is a dull cramp." She also reports a "swollen gland" in her right groin area that was big and caused lots of pain and restriction of movement. She also reports having another "bump" in her vaginal area that was noticed today and acknowledges that she gets hair bumps that occur after waxing, but this one is different.   Patient goes on to express concern about a STD exposure as her current partner admitted to sexual acts with two other women, however he denies current complaints or symptoms.  She also states that she has the abdominal pain every month around her period and was prescribed a medication, by her primary gyn, that helps but can not recall what it is.  Patient requests full STD testing today and Plan B for an unprotected sexual encounter that occurred on Monday.       Pertinent Gynecological History: Menses: regular every 21 days without intermenstrual spotting Bleeding: None Contraception: none DES exposure: unknown Blood transfusions: none Sexually transmitted diseases: Yes Previous GYN Procedures: None  Last mammogram: N/A  Last pap: normal    Past Medical History:  Diagnosis Date  . Anxiety   . Cervical  dysplasia   . Depression    pp for 3 mos; was on medication  . Genital warts    h/o cryo   . Menorrhagia   . Panic attacks     Past Surgical History:  Procedure Laterality Date  . CERVICAL BIOPSY  W/ LOOP ELECTRODE EXCISION    . DENTAL SURGERY  July 2014, September 2013   wisdom tooth extraction; other oral surgery    Family History  Problem Relation Age of Onset  . Colon cancer Father 53       died at age 49   . Anemia Mother   . Prader-Willi syndrome Sister   . Diabetes Paternal Grandmother     Social History   Tobacco Use  . Smoking status: Never Smoker  . Smokeless tobacco: Never Used  Substance Use Topics  . Alcohol use: Yes    Comment: social  . Drug use: No    Allergies: No Known Allergies  Medications Prior to Admission  Medication Sig Dispense Refill Last Dose  . methocarbamol (ROBAXIN) 500 MG tablet Take 1 tablet (500 mg total) by mouth 2 (two) times daily. 20 tablet 0   . PEG-KCl-NaCl-NaSulf-Na Asc-C (PLENVU) 140 g SOLR Take 1 kit by mouth as directed. (Patient not taking: Reported on 02/12/2018) 1 each kit Not Taking at Unknown time    Review of Systems  Constitutional: Negative for chills and fever.  Gastrointestinal: Positive for abdominal pain and diarrhea. Negative for constipation, nausea and vomiting.  Genitourinary: Positive for  pelvic pain. Negative for dysuria, vaginal bleeding and vaginal discharge.  Musculoskeletal: Positive for back pain.  Neurological: Negative for dizziness, light-headedness and headaches.   Physical Exam   Blood pressure 117/85, pulse 96, temperature 97.9 F (36.6 C), temperature source Oral, resp. rate 16, weight 74 kg, last menstrual period 10/22/2018, SpO2 97 %.  Physical Exam  Constitutional: She is oriented to person, place, and time. She appears well-developed and well-nourished. No distress.  HENT:  Head: Normocephalic and atraumatic.  Eyes: Conjunctivae are normal.  Neck: Normal range of motion.   Cardiovascular: Normal rate.  Respiratory: Effort normal.  GI: Soft. She exhibits no distension. There is no abdominal tenderness. There is no rebound and no guarding.  Genitourinary: Rectum:     External hemorrhoid present.  Uterus is not tender. Cervix exhibits no motion tenderness, no discharge and no friability.    Vaginal discharge present.     No vaginal bleeding.  No bleeding in the vagina.    Genitourinary Comments: Sterile Speculum Exam: -Labia/Mons Pubis: ~31m open pustule noted on right upper quadrant of mons pubis.  Pin sized pus filled pustule noted on left lower labia near perineum. Both Non tender -Vaginal Vault: Pink mucosa. Small amt thin white discharge in vault -wet prep collected -Cervix:Pink, no lesions, cysts, or polyps.  Appears closed. No active bleeding or discharge from os-GC/CT collected -Bimanual Exam: No CMT, No Fundal Tenderness, No tenderness in Cul de Sac, UTA adnexa    Musculoskeletal: Normal range of motion.        General: No edema.  Neurological: She is alert and oriented to person, place, and time.  Skin: Skin is warm and dry.  Psychiatric: She has a normal mood and affect. Her behavior is normal.    MAU Course  Procedures Results for orders placed or performed during the hospital encounter of 11/10/18 (from the past 24 hour(s))  Urinalysis, Routine w reflex microscopic     Status: None   Collection Time: 11/10/18  4:10 PM  Result Value Ref Range   Color, Urine YELLOW YELLOW   APPearance CLEAR CLEAR   Specific Gravity, Urine 1.010 1.005 - 1.030   pH 7.0 5.0 - 8.0   Glucose, UA NEGATIVE NEGATIVE mg/dL   Hgb urine dipstick NEGATIVE NEGATIVE   Bilirubin Urine NEGATIVE NEGATIVE   Ketones, ur NEGATIVE NEGATIVE mg/dL   Protein, ur NEGATIVE NEGATIVE mg/dL   Nitrite NEGATIVE NEGATIVE   Leukocytes, UA NEGATIVE NEGATIVE  Pregnancy, urine POC     Status: None   Collection Time: 11/10/18  4:11 PM  Result Value Ref Range   Preg Test, Ur NEGATIVE  NEGATIVE  Wet prep, genital     Status: Abnormal   Collection Time: 11/10/18  6:05 PM  Result Value Ref Range   Yeast Wet Prep HPF POC NONE SEEN NONE SEEN   Trich, Wet Prep NONE SEEN NONE SEEN   Clue Cells Wet Prep HPF POC NONE SEEN NONE SEEN   WBC, Wet Prep HPF POC FEW (A) NONE SEEN   Sperm NONE SEEN     MDM Pelvic Exam with cultures Wet prep and GC/CT Labs: UA, Hep C, HIV, RPR Assessment and Plan  Abdominal Pain Vaginal Discharge  -Exam findings discussed -Reassurances given that findings not suspicious for GC/CT  -Wet prep pending -GC/CT collected and sent -Will obtain blood work for other STD as requested -Discussed Plan B and decreased efficacy after 72 hours. Patient verbalizes understanding and would still like Rx.  -Offered and declined ibuprofen  for pain management -Will await results   Follow Up (1855)  -Wet prep returns without significant findings. -Results discussed with patient. -Encouraged to contact primary gyn for further evaluation of abdominal pain. -Encouraged to utilize birth control method to avoid unwanted pregnancy. -Rx for Plan B One Step sent to pharmacy on file.  Encouraged to pick up tonight. -Q/C addressed regarding potential for STD panel returning back positive. -Reassurances given that provider or other hospital staff would contact with positive results for immediate treatment. -No other q/c. -Encouraged to call primary gyn or return to MAU if symptoms worsen or with the onset of new symptoms.  -Discharged to home in stable condition  Maryann Conners MSN, CNM 11/10/2018, 5:27 PM

## 2018-11-10 NOTE — Discharge Instructions (Signed)
Levonorgestrel emergency contraceptive kit  What is this medicine?  LEVONORGESTREL (LEE voe nor JES trel) is an emergency contraceptive. It prevents pregnancy if taken within 72 hours after your regular birth control fails or you have unprotected sex. This medicine will not work if you are already pregnant.  This medicine may be used for other purposes; ask your health care provider or pharmacist if you have questions.  COMMON BRAND NAME(S): EContra EZ, EContra One-Step, Fallback Solo, My Choice, My Way, Next Choice, Next Choice One Dose, Opcicon One-Step, Plan B, Plan B One-Step, Preventeza, React, Take Action  What should I tell my health care provider before I take this medicine?  They need to know if you have or ever had any of these conditions:  -an unusual or allergic reaction to levonorgestrel, other medicines, foods, dyes, or preservatives  -pregnant or trying to get pregnant  -breast-feeding  How should I use this medicine?  Take this medicine by mouth. Your doctor may want you to use a quick-response pregnancy test prior to using the tablets. Take your medicine as soon as you can after having unprotected sex, preferably in the first 24 hours, but no later than 72 hours (3 days) after the event. Follow the dose instructions of your health care provider exactly. Do not take any extra pills. Extra pills will not decrease your risk of pregnancy, but may increase your risk of side effects.  A patient package insert for the product will be given with each prescription and refill. Read this sheet carefully each time. The sheet may change frequently.  Contact your pediatrician regarding the use of this medicine in children. Special care may be needed. This medicine has been used in female children who have started having menstrual periods.  Overdosage: If you think you have taken too much of this medicine contact a poison control center or emergency room at once.  NOTE: This medicine is only for you. Do not share  this medicine with others.  What if I miss a dose?  This medicine is not for regular use. Take exactly as directed. If you vomit within 2 hours of taking your dose, contact your health care professional for instructions.  What may interact with this medicine?  -aprepitant  -armodafinil  -barbiturates such as phenobarbital or primidone  -bexarotene  -bosentan  -carbamazepine  -certain medicines for HIV or AIDS or hepatitis  -felbamate  -griseofulvin  -modafinil  -oxcarbazepine  -phenytoin  -rifabutin  -rifampin  -rifapentine  -St. John's wort  -topiramate  This list may not describe all possible interactions. Give your health care provider a list of all the medicines, herbs, non-prescription drugs, or dietary supplements you use. Also tell them if you smoke, drink alcohol, or use illegal drugs. Some items may interact with your medicine.  What should I watch for while using this medicine?  Your period may begin a few days earlier or later than expected. If your period is more than 7 days late, pregnancy is possible. See your health care provider as soon as you can and get a pregnancy test.  Talk to your healthcare provider before taking this medicine if you know or suspect that you are pregnant. Contact your healthcare provider if you think you may be pregnant and you have taken this medicine.  If you have severe abdominal pain, you may have a pregnancy outside the womb, which is called an ectopic or tubal pregnancy. Call your health care provider or go to the nearest emergency room right   start one, if you do not have a regular birth control method already. This medicine does not protect you against HIV infection (AIDS) or any other sexually transmitted diseases (STDs). What side  effects may I notice from receiving this medicine? Side effects that you should report to your doctor or health care professional as soon as possible: -allergic reactions like skin rash, itching or hives, swelling of the face, lips, or tongue Side effects that usually do not require medical attention (report to your doctor or health care professional if they continue or are bothersome): -abdominal pain or cramping -breast tenderness -dizziness -headache -nausea -spotting -tiredness This list may not describe all possible side effects. Call your doctor for medical advice about side effects. You may report side effects to FDA at 1-800-FDA-1088. Where should I keep my medicine? Keep out of the reach of children. Store at room temperature between 15 and 30 degrees C (59 and 86 degrees F). Throw away any unused medicine after the expiration date. NOTE: This sheet is a summary. It may not cover all possible information. If you have questions about this medicine, talk to your doctor, pharmacist, or health care provider.  2019 Elsevier/Gold Standard (2017-02-10 14:30:49)

## 2018-11-11 LAB — HEPATITIS C ANTIBODY: HCV Ab: <0.1 s/co ratio (ref 0.0–0.9)

## 2018-11-11 LAB — HIV ANTIBODY (ROUTINE TESTING W REFLEX): HIV Screen 4th Generation wRfx: NONREACTIVE

## 2018-11-11 LAB — RPR: RPR Ser Ql: NONREACTIVE

## 2018-11-12 LAB — GC/CHLAMYDIA PROBE AMP (~~LOC~~) NOT AT ARMC
Chlamydia: NEGATIVE
Neisseria Gonorrhea: NEGATIVE

## 2018-12-10 ENCOUNTER — Inpatient Hospital Stay (HOSPITAL_COMMUNITY): Payer: Managed Care, Other (non HMO)

## 2018-12-10 ENCOUNTER — Other Ambulatory Visit: Payer: Self-pay

## 2018-12-10 ENCOUNTER — Encounter (HOSPITAL_COMMUNITY): Payer: Self-pay | Admitting: *Deleted

## 2018-12-10 ENCOUNTER — Inpatient Hospital Stay (HOSPITAL_COMMUNITY)
Admission: AD | Admit: 2018-12-10 | Discharge: 2018-12-10 | Disposition: A | Discharge status: Home / Self Care | Payer: Managed Care, Other (non HMO) | Attending: Obstetrics and Gynecology | Admitting: Obstetrics and Gynecology

## 2018-12-10 DIAGNOSIS — Z3A01 Less than 8 weeks gestation of pregnancy: Secondary | ICD-10-CM | POA: Insufficient documentation

## 2018-12-10 DIAGNOSIS — O26891 Other specified pregnancy related conditions, first trimester: Secondary | ICD-10-CM

## 2018-12-10 DIAGNOSIS — O212 Late vomiting of pregnancy: Secondary | ICD-10-CM | POA: Insufficient documentation

## 2018-12-10 DIAGNOSIS — O418X10 Other specified disorders of amniotic fluid and membranes, first trimester, not applicable or unspecified: Secondary | ICD-10-CM

## 2018-12-10 DIAGNOSIS — O219 Vomiting of pregnancy, unspecified: Secondary | ICD-10-CM | POA: Diagnosis not present

## 2018-12-10 DIAGNOSIS — O99611 Diseases of the digestive system complicating pregnancy, first trimester: Secondary | ICD-10-CM | POA: Insufficient documentation

## 2018-12-10 DIAGNOSIS — K117 Disturbances of salivary secretion: Secondary | ICD-10-CM | POA: Diagnosis not present

## 2018-12-10 DIAGNOSIS — O360910 Maternal care for other rhesus isoimmunization, first trimester, not applicable or unspecified: Secondary | ICD-10-CM

## 2018-12-10 DIAGNOSIS — O468X1 Other antepartum hemorrhage, first trimester: Secondary | ICD-10-CM

## 2018-12-10 DIAGNOSIS — O208 Other hemorrhage in early pregnancy: Secondary | ICD-10-CM | POA: Insufficient documentation

## 2018-12-10 DIAGNOSIS — R109 Unspecified abdominal pain: Secondary | ICD-10-CM | POA: Diagnosis present

## 2018-12-10 DIAGNOSIS — Z6791 Unspecified blood type, Rh negative: Secondary | ICD-10-CM | POA: Diagnosis not present

## 2018-12-10 DIAGNOSIS — Z6741 Type O blood, Rh negative: Secondary | ICD-10-CM

## 2018-12-10 LAB — POCT PREGNANCY, URINE: Preg Test, Ur: POSITIVE — AB

## 2018-12-10 LAB — CBC WITH DIFFERENTIAL/PLATELET
Abs Immature Granulocytes: 0.02 10*3/uL (ref 0.00–0.07)
Basophils Absolute: 0 10*3/uL (ref 0.0–0.1)
Basophils Relative: 0 %
Eosinophils Absolute: 0.1 10*3/uL (ref 0.0–0.5)
Eosinophils Relative: 1 %
HEMATOCRIT: 37.2 % (ref 36.0–46.0)
HEMOGLOBIN: 11.9 g/dL — AB (ref 12.0–15.0)
Immature Granulocytes: 0 %
Lymphocytes Relative: 33 %
Lymphs Abs: 2.1 10*3/uL (ref 0.7–4.0)
MCH: 26.9 pg (ref 26.0–34.0)
MCHC: 32 g/dL (ref 30.0–36.0)
MCV: 84 fL (ref 80.0–100.0)
Monocytes Absolute: 0.6 10*3/uL (ref 0.1–1.0)
Monocytes Relative: 9 %
NEUTROS ABS: 3.7 10*3/uL (ref 1.7–7.7)
Neutrophils Relative %: 57 %
Platelets: 273 10*3/uL (ref 150–400)
RBC: 4.43 MIL/uL (ref 3.87–5.11)
RDW: 13.6 % (ref 11.5–15.5)
WBC: 6.5 10*3/uL (ref 4.0–10.5)
nRBC: 0 % (ref 0.0–0.2)

## 2018-12-10 LAB — COMPREHENSIVE METABOLIC PANEL
ALT: 14 U/L (ref 0–44)
AST: 19 U/L (ref 15–41)
Albumin: 3.7 g/dL (ref 3.5–5.0)
Alkaline Phosphatase: 41 U/L (ref 38–126)
Anion gap: 7 (ref 5–15)
BUN: 14 mg/dL (ref 6–20)
CO2: 23 mmol/L (ref 22–32)
Calcium: 8.7 mg/dL — ABNORMAL LOW (ref 8.9–10.3)
Chloride: 103 mmol/L (ref 98–111)
Creatinine, Ser: 0.72 mg/dL (ref 0.44–1.00)
GFR calc Af Amer: >60 mL/min (ref >60)
GFR calc non Af Amer: >60 mL/min (ref >60)
Glucose, Bld: 75 mg/dL (ref 70–99)
Potassium: 3.6 mmol/L (ref 3.5–5.1)
SODIUM: 133 mmol/L — AB (ref 135–145)
Total Bilirubin: 0.8 mg/dL (ref 0.3–1.2)
Total Protein: 7.1 g/dL (ref 6.5–8.1)

## 2018-12-10 LAB — URINALYSIS, ROUTINE W REFLEX MICROSCOPIC
Bilirubin Urine: NEGATIVE
GLUCOSE, UA: NEGATIVE mg/dL
Hgb urine dipstick: NEGATIVE
Ketones, ur: NEGATIVE mg/dL
Leukocytes,Ua: NEGATIVE
Nitrite: NEGATIVE
Protein, ur: NEGATIVE mg/dL
Specific Gravity, Urine: 1.014 (ref 1.005–1.030)
pH: 6 (ref 5.0–8.0)

## 2018-12-10 LAB — HCG, QUANTITATIVE, PREGNANCY: hCG, Beta Chain, Quant, S: 95826 m[IU]/mL — ABNORMAL HIGH (ref <5)

## 2018-12-10 MED ORDER — ONDANSETRON 8 MG PO TBDP: 8.0000 mg | ORAL_TABLET | Freq: Three times a day (TID) | ORAL | 0 refills | Status: DC | PRN

## 2018-12-10 MED ORDER — METOCLOPRAMIDE HCL 10 MG PO TABS: 10.0000 mg | ORAL_TABLET | Freq: Three times a day (TID) | ORAL | 1 refills | Status: DC

## 2018-12-10 MED ORDER — GLYCOPYRROLATE 1 MG PO TABS: 1.0000 mg | ORAL_TABLET | Freq: Two times a day (BID) | ORAL | 0 refills | Status: DC

## 2018-12-10 MED ORDER — RHO D IMMUNE GLOBULIN 1500 UNIT/2ML IJ SOSY
300.0000 ug | PREFILLED_SYRINGE | Freq: Once | INTRAMUSCULAR | Status: AC
  Administered 2018-12-10: 300 ug via INTRAMUSCULAR
  Filled 2018-12-10: qty 2

## 2018-12-10 NOTE — MAU Note (Signed)
Pt reports having really bad cramping in her lower abd x 2 days, had bright red bleeding yesterday, pink discharge today. Positive home preg test today.

## 2018-12-10 NOTE — MAU Provider Note (Signed)
History     CSN: [id number]  Arrival date and time: 12/10/18 1354   First Provider Initiated Contact with Patient 12/10/18 1557      No chief complaint on file.  HPI   Ms.Morgan Quinn is a 33 y.o. female [id number] @ [redacted]w[redacted]d here with abdominal pain/nausea she also saw some spotting 3 days ago. The bleeding she saw was pink in color. Today the discharge is pink in color. No active bleeding.   Has had ongoing N/V. She is concerned with the amount of work she has missed from the N/V. She is taking phenergan which is not working. She is requesting something esp Zofran.   OB History    Gravida  6   Para  4   Term  4   Preterm  0   AB  0   Living  4     SAB  0   TAB  0   Ectopic  0   Multiple  0   Live Births  4           Past Medical History:  Diagnosis Date  . Anxiety   . Cervical dysplasia   . Depression    pp for 3 mos; was on medication  . Genital warts    h/o cryo   . Menorrhagia   . Panic attacks     Past Surgical History:  Procedure Laterality Date  . CERVICAL BIOPSY  W/ LOOP ELECTRODE EXCISION    . DENTAL SURGERY  July 2014, September 2013   wisdom tooth extraction; other oral surgery    Family History  Problem Relation Age of Onset  . Colon cancer Father 78       died at age 17   . Anemia Mother   . Prader-Willi syndrome Sister   . Diabetes Paternal Grandmother     Social History   Tobacco Use  . Smoking status: Never Smoker  . Smokeless tobacco: Never Used  Substance Use Topics  . Alcohol use: Yes    Comment: social  . Drug use: No    Allergies: No Known Allergies  No medications prior to admission.   Results for orders placed or performed during the hospital encounter of 12/10/18 (from the past 48 hour(s))  Pregnancy, urine POC     Status: Abnormal   Collection Time: 12/10/18  2:57 PM  Result Value Ref Range   Preg Test, Ur POSITIVE (A) NEGATIVE    Comment:        THE SENSITIVITY OF THIS METHODOLOGY IS >24 mIU/mL    Urinalysis, Routine w reflex microscopic     Status: None   Collection Time: 12/10/18  3:01 PM  Result Value Ref Range   Color, Urine YELLOW YELLOW   APPearance CLEAR CLEAR   Specific Gravity, Urine 1.014 1.005 - 1.030   pH 6.0 5.0 - 8.0   Glucose, UA NEGATIVE NEGATIVE mg/dL   Hgb urine dipstick NEGATIVE NEGATIVE   Bilirubin Urine NEGATIVE NEGATIVE   Ketones, ur NEGATIVE NEGATIVE mg/dL   Protein, ur NEGATIVE NEGATIVE mg/dL   Nitrite NEGATIVE NEGATIVE   Leukocytes,Ua NEGATIVE NEGATIVE    Comment: Performed at [facility], [address], [place], [place] [address]  CBC with Differential/Platelet     Status: Abnormal   Collection Time: 12/10/18  3:43 PM  Result Value Ref Range   WBC 6.5 4.0 - 10.5 K/uL   RBC 4.43 3.87 - 5.11 MIL/uL   Hemoglobin 11.9 (L) 12.0 - 15.0  g/dL   HCT 50.0 37.0 - 48.8 %   MCV 84.0 80.0 - 100.0 fL   MCH 26.9 26.0 - 34.0 pg   MCHC 32.0 30.0 - 36.0 g/dL   RDW 89.1 69.4 - 50.3 %   Platelets 273 150 - 400 K/uL   nRBC 0.0 0.0 - 0.2 %   Neutrophils Relative % 57 %   Neutro Abs 3.7 1.7 - 7.7 K/uL   Lymphocytes Relative 33 %   Lymphs Abs 2.1 0.7 - 4.0 K/uL   Monocytes Relative 9 %   Monocytes Absolute 0.6 0.1 - 1.0 K/uL   Eosinophils Relative 1 %   Eosinophils Absolute 0.1 0.0 - 0.5 K/uL   Basophils Relative 0 %   Basophils Absolute 0.0 0.0 - 0.1 K/uL   Immature Granulocytes 0 %   Abs Immature Granulocytes 0.02 0.00 - 0.07 K/uL    Comment: Performed at [facility], [address] [place], [place] [address]  Comprehensive metabolic panel     Status: Abnormal   Collection Time: 12/10/18  3:43 PM  Result Value Ref Range   Sodium 133 (L) 135 - 145 mmol/L   Potassium 3.6 3.5 - 5.1 mmol/L   Chloride 103 98 - 111 mmol/L   CO2 23 22 - 32 mmol/L   Glucose, Bld 75 70 - 99 mg/dL   BUN 14 6 - 20 mg/dL   Creatinine, Ser 0.03 0.44 - 1.00 mg/dL   Calcium 8.7 (L) 8.9 - 10.3 mg/dL   Total Protein 7.1 6.5 - 8.1 g/dL   Albumin 3.7 3.5 - 5.0 g/dL   AST  19 15 - 41 U/L   ALT 14 0 - 44 U/L   Alkaline Phosphatase 41 38 - 126 U/L   Total Bilirubin 0.8 0.3 - 1.2 mg/dL   GFR calc non Af Amer >60 >60 mL/min   GFR calc Af Amer >60 >60 mL/min   Anion gap 7 5 - 15    Comment: Performed at [facility], [address] [place], [place] [address]  hCG, quantitative, pregnancy     Status: Abnormal   Collection Time: 12/10/18  3:43 PM  Result Value Ref Range   hCG, Beta Chain, Quant, S 95,826 (H) <5 mIU/mL    Comment:          GEST. AGE      CONC.  (mIU/mL)   <=1 WEEK        5 - 50     2 WEEKS       50 - 500     3 WEEKS       100 - 10,000     4 WEEKS     1,000 - 30,000     5 WEEKS     3,500 - 115,000   6-8 WEEKS     12,000 - 270,000    12 WEEKS     15,000 - 220,000        FEMALE AND NON-PREGNANT FEMALE:     LESS THAN 5 mIU/mL Performed at [facility], [address] [place], [place] [address]   Rh IG workup (includes ABO/Rh)     Status: None (Preliminary result)   Collection Time: 12/10/18  4:05 PM  Result Value Ref Range   Gestational Age(Wks) 7    ABO/RH(D) O NEG    Antibody Screen NEG    Unit Number [id number]    Blood Component Type RHIG    Unit division 00  Status of Unit ISSUED    Transfusion Status      OK TO TRANSFUSE Performed at [facility], [address] [place], [place] [address]    US Ob Comp Less 14 Wks  Result Date: 12/10/2018 CLINICAL DATA:  Pregnant patient with abdominal pain. EXAM: OBSTETRIC <14 WK ULTRASOUND TECHNIQUE: Transabdominal ultrasound was performed for evaluation of the gestation as well as the maternal uterus and adnexal regions. COMPARISON:  None. FINDINGS: Intrauterine gestational sac: Single Yolk sac:  Visualized Embryo:  Visualized Cardiac Activity: Visualized Heart Rate: 144 bpm MSD:    mm    w     d CRL:   9.2 mm   6 w 6 d                  [place] EDC: July 30, 2019 Subchorionic hemorrhage:  There is a small subchorionic hemorrhage. Maternal uterus/adnexae: 2 adjacent simple  cysts are seen in the right ovary measuring 5.4 x 3.7 x 4.1 cm and 5.3 x 4.7 x 4.6 cm. The left ovary is normal in appearance. There is a small amount of fluid adjacent to the right ovary. IMPRESSION: 1. Two cysts measuring up to 5.4 cm in the right ovary with a small amount of adjacent fluid. The 2 cysts may represent dominant follicles. 2. Single live intrauterine pregnancy. Electronically Signed   By: Gerome Sam III M.D   On: 12/10/2018 17:27   Review of Systems  Gastrointestinal: Positive for abdominal pain, nausea and vomiting.  Genitourinary: Positive for vaginal bleeding (Spotting ).   Physical Exam   Blood pressure (!) 97/52, pulse 84, temperature 98.2 F (36.8 C), resp. rate 15, last menstrual period 10/22/2018, SpO2 100 %.  Physical Exam  Constitutional: She is oriented to person, place, and time. She appears well-developed and well-nourished. No distress.  HENT:  Head: Normocephalic.  GI: There is generalized abdominal tenderness. There is no rigidity, no rebound, no guarding, no tenderness at McBurney's point and negative Murphy's sign.  Genitourinary:    Genitourinary Comments: Cervix closed, long.  No bleeding on exam    Musculoskeletal: Normal range of motion.  Neurological: She is alert and oriented to person, place, and time.  Skin: Skin is warm. She is not diaphoretic.  Psychiatric: Her behavior is normal.   MAU Course  Procedures  None  MDM  O negative blood type: patient received rhogam  SIUP noted on [place] today Patient had negative wet prep and GC on 11/10/18 Patient has failed phenergan for N/v. I discussed with the patient the risk of zofran use in the first trimester of pregnancy. Risks of use in the first trimester specifically during organogenesis include cleft lip/ palate in infants. Patient voices that she has missed days of work and really needs to resume her regular schedule; she is requesting Zofran today. Discussed a trial of Reglan first, and patient  agreed. Zofran will be given for last resort.     Assessment and Plan   A:  1. Type O blood, Rh negative   2. Abdominal pain in pregnancy, first trimester   3. Nausea and vomiting in pregnancy   4. Ptyalism   5. Subchorionic hematoma in first trimester, single or unspecified fetus     P:  Discharge home in stable condition Return to MAU if symptoms worsen Rx: robinul, reglan, Zofran #6  F/u with OB as scheduled Bleeding precautions Pelvic rest  Rasch, Harolyn Rutherford, NP 12/10/2018 8:31 PM

## 2018-12-10 NOTE — Discharge Instructions (Signed)
Morning Sickness    Morning sickness is when you feel sick to your stomach (nauseous) during pregnancy. You may feel sick to your stomach and throw up (vomit). You may feel sick in the morning, but you can feel this way at any time of day. Some women feel very sick to their stomach and cannot stop throwing up (hyperemesis gravidarum).  Follow these instructions at home:  Medicines   Take over-the-counter and prescription medicines only as told by your doctor. Do not take any medicines until you talk with your doctor about them first.   Taking multivitamins before getting pregnant can stop or lessen the harshness of morning sickness.  Eating and drinking   Eat dry toast or crackers before getting out of bed.   Eat 5 or 6 small meals a day.   Eat dry and bland foods like rice and baked potatoes.   Do not eat greasy, fatty, or spicy foods.   Have someone cook for you if the smell of food causes you to feel sick or throw up.   If you feel sick to your stomach after taking prenatal vitamins, take them at night or with a snack.   Eat protein when you need a snack. Nuts, yogurt, and cheese are good choices.   Drink fluids throughout the day.   Try ginger ale made with real ginger, ginger tea made from fresh grated ginger, or ginger candies.  General instructions   Do not use any products that have nicotine or tobacco in them, such as cigarettes and e-cigarettes. If you need help quitting, ask your doctor.   Use an air purifier to keep the air in your house free of smells.   Get lots of fresh air.   Try to avoid smells that make you feel sick.   Try:  ? Wearing a bracelet that is used for seasickness (acupressure wristband).  ? Going to a doctor who puts thin needles into certain body points (acupuncture) to improve how you feel.  Contact a doctor if:   You need medicine to feel better.   You feel dizzy or light-headed.   You are losing weight.  Get help right away if:   You feel very sick to your  stomach and cannot stop throwing up.   You pass out (faint).   You have very bad pain in your belly.  Summary   Morning sickness is when you feel sick to your stomach (nauseous) during pregnancy.   You may feel sick in the morning, but you can feel this way at any time of day.   Making some changes to what you eat may help your symptoms go away.  This information is not intended to replace advice given to you by your health care provider. Make sure you discuss any questions you have with your health care provider.  Document Released: 11/03/2004 Document Revised: 10/27/2016 Document Reviewed: 10/27/2016  Elsevier Interactive Patient Education  2019 [employer]

## 2018-12-11 LAB — RH IG WORKUP (INCLUDES ABO/RH)
ABO/RH(D): O NEG
Antibody Screen: NEGATIVE
Gestational Age(Wks): 7
Unit division: 0

## 2018-12-14 ENCOUNTER — Inpatient Hospital Stay (HOSPITAL_COMMUNITY)
Admission: AD | Admit: 2018-12-14 | Discharge: 2018-12-14 | Disposition: A | Discharge status: Home / Self Care | Payer: Managed Care, Other (non HMO) | Attending: Obstetrics and Gynecology | Admitting: Obstetrics and Gynecology

## 2018-12-14 ENCOUNTER — Other Ambulatory Visit: Payer: Self-pay

## 2018-12-14 ENCOUNTER — Encounter (HOSPITAL_COMMUNITY): Payer: Self-pay | Admitting: *Deleted

## 2018-12-14 DIAGNOSIS — R42 Dizziness and giddiness: Secondary | ICD-10-CM | POA: Diagnosis not present

## 2018-12-14 DIAGNOSIS — Z833 Family history of diabetes mellitus: Secondary | ICD-10-CM | POA: Insufficient documentation

## 2018-12-14 DIAGNOSIS — R0602 Shortness of breath: Secondary | ICD-10-CM | POA: Insufficient documentation

## 2018-12-14 DIAGNOSIS — K117 Disturbances of salivary secretion: Secondary | ICD-10-CM

## 2018-12-14 DIAGNOSIS — F329 Major depressive disorder, single episode, unspecified: Secondary | ICD-10-CM | POA: Diagnosis not present

## 2018-12-14 DIAGNOSIS — O219 Vomiting of pregnancy, unspecified: Secondary | ICD-10-CM | POA: Diagnosis present

## 2018-12-14 DIAGNOSIS — Z800 Family history of malignant neoplasm of digestive organs: Secondary | ICD-10-CM | POA: Insufficient documentation

## 2018-12-14 DIAGNOSIS — Z3A01 Less than 8 weeks gestation of pregnancy: Secondary | ICD-10-CM

## 2018-12-14 DIAGNOSIS — O9989 Other specified diseases and conditions complicating pregnancy, childbirth and the puerperium: Secondary | ICD-10-CM | POA: Diagnosis not present

## 2018-12-14 DIAGNOSIS — F419 Anxiety disorder, unspecified: Secondary | ICD-10-CM | POA: Diagnosis not present

## 2018-12-14 DIAGNOSIS — O99341 Other mental disorders complicating pregnancy, first trimester: Secondary | ICD-10-CM | POA: Insufficient documentation

## 2018-12-14 DIAGNOSIS — O99611 Diseases of the digestive system complicating pregnancy, first trimester: Secondary | ICD-10-CM | POA: Insufficient documentation

## 2018-12-14 LAB — CBC
HCT: 37.5 % (ref 36.0–46.0)
Hemoglobin: 11.9 g/dL — ABNORMAL LOW (ref 12.0–15.0)
MCH: 26.7 pg (ref 26.0–34.0)
MCHC: 31.7 g/dL (ref 30.0–36.0)
MCV: 84.3 fL (ref 80.0–100.0)
Platelets: 266 10*3/uL (ref 150–400)
RBC: 4.45 MIL/uL (ref 3.87–5.11)
RDW: 13.3 % (ref 11.5–15.5)
WBC: 7.2 10*3/uL (ref 4.0–10.5)
nRBC: 0 % (ref 0.0–0.2)

## 2018-12-14 LAB — COMPREHENSIVE METABOLIC PANEL
ALT: 10 U/L (ref 0–44)
AST: 16 U/L (ref 15–41)
Albumin: 3.5 g/dL (ref 3.5–5.0)
Alkaline Phosphatase: 41 U/L (ref 38–126)
Anion gap: 7 (ref 5–15)
BUN: 14 mg/dL (ref 6–20)
CO2: 23 mmol/L (ref 22–32)
Calcium: 8.9 mg/dL (ref 8.9–10.3)
Chloride: 105 mmol/L (ref 98–111)
Creatinine, Ser: 0.73 mg/dL (ref 0.44–1.00)
GFR calc non Af Amer: >60 mL/min (ref >60)
GLUCOSE: 110 mg/dL — AB (ref 70–99)
Potassium: 3.9 mmol/L (ref 3.5–5.1)
SODIUM: 135 mmol/L (ref 135–145)
Total Bilirubin: 0.7 mg/dL (ref 0.3–1.2)
Total Protein: 7.1 g/dL (ref 6.5–8.1)

## 2018-12-14 LAB — URINALYSIS, ROUTINE W REFLEX MICROSCOPIC
Bilirubin Urine: NEGATIVE
Glucose, UA: NEGATIVE mg/dL
Hgb urine dipstick: NEGATIVE
Ketones, ur: NEGATIVE mg/dL
Leukocytes,Ua: NEGATIVE
Nitrite: NEGATIVE
Protein, ur: NEGATIVE mg/dL
Specific Gravity, Urine: 1.023 (ref 1.005–1.030)
pH: 7 (ref 5.0–8.0)

## 2018-12-14 MED ORDER — GLYCOPYRROLATE 0.2 MG/ML IJ SOLN
0.1000 mg | Freq: Once | INTRAMUSCULAR | Status: AC
  Administered 2018-12-14: 0.1 mg via INTRAVENOUS
  Filled 2018-12-14 (×2): qty 0.5

## 2018-12-14 MED ORDER — LACTATED RINGERS IV BOLUS
1000.0000 mL | Freq: Once | INTRAVENOUS | Status: AC
  Administered 2018-12-14: 1000 mL via INTRAVENOUS

## 2018-12-14 MED ORDER — FAMOTIDINE IN NACL 20-0.9 MG/50ML-% IV SOLN
20.0000 mg | Freq: Once | INTRAVENOUS | Status: AC
  Administered 2018-12-14: 20 mg via INTRAVENOUS
  Filled 2018-12-14: qty 50

## 2018-12-14 MED ORDER — PROMETHAZINE HCL 25 MG/ML IJ SOLN
25.0000 mg | Freq: Four times a day (QID) | INTRAMUSCULAR | Status: DC | PRN
  Administered 2018-12-14: 25 mg via INTRAVENOUS
  Filled 2018-12-14: qty 1

## 2018-12-14 MED ORDER — SCOPOLAMINE 1 MG/3DAYS TD PT72
1.0000 | MEDICATED_PATCH | TRANSDERMAL | Status: DC
  Administered 2018-12-14: 1.5 mg via TRANSDERMAL
  Filled 2018-12-14: qty 1

## 2018-12-14 MED ORDER — SCOPOLAMINE 1 MG/3DAYS TD PT72: 1.0000 | MEDICATED_PATCH | TRANSDERMAL | 0 refills | Status: AC

## 2018-12-14 MED ORDER — ONDANSETRON 8 MG PO TBDP: 8.0000 mg | ORAL_TABLET | Freq: Three times a day (TID) | ORAL | 0 refills | Status: DC | PRN

## 2018-12-14 MED ORDER — ACETAMINOPHEN 325 MG PO TABS
650.0000 mg | ORAL_TABLET | Freq: Once | ORAL | Status: AC
  Administered 2018-12-14: 650 mg via ORAL
  Filled 2018-12-14: qty 2

## 2018-12-14 MED ORDER — M.V.I. ADULT IV INJ
Freq: Once | INTRAVENOUS | Status: AC
  Administered 2018-12-14: 21:00:00 via INTRAVENOUS
  Filled 2018-12-14: qty 10

## 2018-12-14 NOTE — MAU Note (Signed)
Pt eating personal pan pizza.

## 2018-12-14 NOTE — Discharge Instructions (Signed)

## 2018-12-14 NOTE — MAU Provider Note (Addendum)
History     CSN: [id number]  Arrival date and time: 12/14/18 1430   First Provider Initiated Contact with Patient 12/14/18 1837      Chief Complaint  Patient presents with  . Shortness of Breath  . Dizziness   [id number] @7 .4 wks presenting with ongoing N/V, and spitting. Sx started weeks ago. She has tried Phenergan and Zofran which help but cannot tolerate any food and most fluids. Denies fevers, having hot flashes. Also c/o constipation. Last BM was 3 days ago. Denies abd pain and VB.    OB History    Gravida  5   Para  4   Term  4   Preterm  0   AB  0   Living  4     SAB  0   TAB  0   Ectopic  0   Multiple  0   Live Births  4           Past Medical History:  Diagnosis Date  . Anxiety   . Cervical dysplasia   . Depression    pp for 3 mos; was on medication  . Genital warts    h/o cryo   . Menorrhagia   . Panic attacks     Past Surgical History:  Procedure Laterality Date  . CERVICAL BIOPSY  W/ LOOP ELECTRODE EXCISION    . DENTAL SURGERY  July 2014, September 2013   wisdom tooth extraction; other oral surgery    Family History  Problem Relation Age of Onset  . Colon cancer Father 40       died at age 71   . Anemia Mother   . Prader-Willi syndrome Sister   . Diabetes Paternal Grandmother     Social History   Tobacco Use  . Smoking status: Never Smoker  . Smokeless tobacco: Never Used  Substance Use Topics  . Alcohol use: Yes    Comment: social  . Drug use: No    Allergies: No Known Allergies  No medications prior to admission.    Review of Systems  Constitutional: Positive for appetite change. Negative for chills and fever.  Gastrointestinal: Positive for constipation, nausea and vomiting. Negative for abdominal pain.  Genitourinary: Negative for vaginal bleeding.  Neurological: Positive for weakness and light-headedness. Negative for syncope.   Physical Exam   Blood pressure (!) 103/53, pulse 68, temperature 97.8 F  (36.6 C), temperature source Oral, resp. rate 16, weight 73.7 kg, last menstrual period 10/22/2018, SpO2 100 %.  Physical Exam  Nursing note and vitals reviewed. Constitutional: She is oriented to person, place, and time. She appears well-developed and well-nourished. No distress.  HENT:  Head: Normocephalic and atraumatic.  Neck: Normal range of motion.  Cardiovascular: Normal rate.  Respiratory: Effort normal. No respiratory distress.  Musculoskeletal: Normal range of motion.  Neurological: She is alert and oriented to person, place, and time.  Psychiatric: She has a normal mood and affect.   Results for orders placed or performed during the hospital encounter of 12/14/18 (from the past 24 hour(s))  Urinalysis, Routine w reflex microscopic     Status: Abnormal   Collection Time: 12/14/18  3:40 PM  Result Value Ref Range   Color, Urine YELLOW YELLOW   APPearance HAZY (A) CLEAR   Specific Gravity, Urine 1.023 1.005 - 1.030   pH 7.0 5.0 - 8.0   Glucose, UA NEGATIVE NEGATIVE mg/dL   Hgb urine dipstick NEGATIVE NEGATIVE   Bilirubin Urine NEGATIVE NEGATIVE   Ketones,  ur NEGATIVE NEGATIVE mg/dL   Protein, ur NEGATIVE NEGATIVE mg/dL   Nitrite NEGATIVE NEGATIVE   Leukocytes,Ua NEGATIVE NEGATIVE  Comprehensive metabolic panel     Status: Abnormal   Collection Time: 12/14/18  7:03 PM  Result Value Ref Range   Sodium 135 135 - 145 mmol/L   Potassium 3.9 3.5 - 5.1 mmol/L   Chloride 105 98 - 111 mmol/L   CO2 23 22 - 32 mmol/L   Glucose, Bld 110 (H) 70 - 99 mg/dL   BUN 14 6 - 20 mg/dL   Creatinine, Ser 4.46 0.44 - 1.00 mg/dL   Calcium 8.9 8.9 - 28.6 mg/dL   Total Protein 7.1 6.5 - 8.1 g/dL   Albumin 3.5 3.5 - 5.0 g/dL   AST 16 15 - 41 U/L   ALT 10 0 - 44 U/L   Alkaline Phosphatase 41 38 - 126 U/L   Total Bilirubin 0.7 0.3 - 1.2 mg/dL   GFR calc non Af Amer >60 >60 mL/min   GFR calc Af Amer >60 >60 mL/min   Anion gap 7 5 - 15  CBC     Status: Abnormal   Collection Time: 12/14/18   7:03 PM  Result Value Ref Range   WBC 7.2 4.0 - 10.5 K/uL   RBC 4.45 3.87 - 5.11 MIL/uL   Hemoglobin 11.9 (L) 12.0 - 15.0 g/dL   HCT 38.1 77.1 - 16.5 %   MCV 84.3 80.0 - 100.0 fL   MCH 26.7 26.0 - 34.0 pg   MCHC 31.7 30.0 - 36.0 g/dL   RDW 79.0 38.3 - 33.8 %   Platelets 266 150 - 400 K/uL   nRBC 0.0 0.0 - 0.2 %   MAU Course  Procedures Orders Placed This Encounter  Procedures  . Urinalysis, Routine w reflex microscopic    Standing Status:   Standing    Number of Occurrences:   1  . Comprehensive metabolic panel    Standing Status:   Standing    Number of Occurrences:   1  . CBC    Standing Status:   Standing    Number of Occurrences:   1  . Discharge patient    Order Specific Question:   Discharge disposition    Answer:   01-Home or Self Care [1]    Order Specific Question:   Discharge patient date    Answer:   12/14/2018   Meds ordered this encounter  Medications  . lactated ringers bolus 1,000 mL  . multivitamins adult (INFUVITE ADULT) 10 mL in lactated ringers 1,000 mL infusion  . promethazine (PHENERGAN) injection 25 mg  . glycopyrrolate (ROBINUL) injection 0.1 mg  . famotidine (PEPCID) IVPB 20 mg premix  . scopolamine (TRANSDERM-SCOP) 1 MG/3DAYS 1.5 mg  . acetaminophen (TYLENOL) tablet 650 mg  . ondansetron (ZOFRAN ODT) 8 MG disintegrating tablet    Sig: Take 1 tablet (8 mg total) by mouth every 8 (eight) hours as needed for nausea or vomiting.    Dispense:  20 tablet    Refill:  0    Order Specific Question:   Supervising Provider    Answer:   Jaynie Collins A [[id number]]  . scopolamine (TRANSDERM-SCOP, 1.5 MG,) 1 MG/3DAYS    Sig: Place 1 patch (1.5 mg total) onto the skin every 3 (three) days for 30 days.    Dispense:  10 patch    Refill:  0    Order Specific Question:   Supervising Provider    Answer:  Jaynie CollinsANYANWU, UGONNA A [[id number]]   MDM Labs and meds ordered.  Transfer of care given to [facility]aw[facility], FNP Donette LarryBhambri, Melanie, CNM  12/14/2018 8:51 PM  Patient responded  well to IVF & meds. Requesting refill for zofran & rx for scop patch. Patient previously counseled regarding use of zofran in early pregnancy.     Assessment and Plan  A:  1. Nausea and vomiting during pregnancy prior to [redacted] weeks gestation   2. Ptyalism   3. [redacted] weeks gestation of pregnancy    P: Discharge home Refill for zofran Rx scop patch Discussed reasons to return to MAU  Judeth HornLawrence, Josedejesus Marcum, NP

## 2018-12-14 NOTE — MAU Note (Signed)
Pt. Waiting in lobby, sitting in wheelchair. Pt. Is more alert now. Pt. Was very understanding and is aware that she is waiting for a room.

## 2018-12-14 NOTE — MAU Note (Signed)
Thinks she is dehydrated.  Is dizzy, short of breath. Feels like she is going to pass out.  Be spitting and spitting and spitting.  Feels so jittery and short of breath. Has been throwing up. Called dr was told to come in for fluids.

## 2019-01-23 ENCOUNTER — Telehealth: Payer: Self-pay | Admitting: Family Medicine

## 2019-01-23 NOTE — Telephone Encounter (Signed)
Attempted to contact patient in regards of her FMLA paperwork that she dropped off. Patient has not been seen in our office before and does not have a surgery scheduled. No answer, left message. Advised to give the office a call back.

## 2019-02-07 ENCOUNTER — Telehealth: Payer: Self-pay | Admitting: Family Medicine

## 2019-02-07 NOTE — Telephone Encounter (Signed)
Attempted to contact patient in regards of her FMLA paperwork to see why we were completing them. No answer, left voicemail to give the office a call back.

## 2019-05-15 ENCOUNTER — Other Ambulatory Visit (HOSPITAL_COMMUNITY): Payer: Self-pay | Admitting: *Deleted

## 2019-05-16 ENCOUNTER — Other Ambulatory Visit: Payer: Self-pay

## 2019-05-16 ENCOUNTER — Encounter (HOSPITAL_COMMUNITY)
Admission: RE | Admit: 2019-05-16 | Discharge: 2019-05-16 | Disposition: A | Discharge status: Home / Self Care | Payer: Managed Care, Other (non HMO) | Source: Ambulatory Visit | Attending: Obstetrics and Gynecology | Admitting: Obstetrics and Gynecology

## 2019-05-16 DIAGNOSIS — D649 Anemia, unspecified: Secondary | ICD-10-CM | POA: Insufficient documentation

## 2019-05-16 DIAGNOSIS — O99019 Anemia complicating pregnancy, unspecified trimester: Secondary | ICD-10-CM | POA: Insufficient documentation

## 2019-05-16 MED ORDER — SODIUM CHLORIDE 0.9 % IV SOLN
510.0000 mg | INTRAVENOUS | Status: DC
  Administered 2019-05-16: 510 mg via INTRAVENOUS
  Filled 2019-05-16: qty 17

## 2019-05-16 NOTE — Discharge Instructions (Signed)

## 2019-05-23 ENCOUNTER — Encounter (HOSPITAL_COMMUNITY)
Admission: RE | Admit: 2019-05-23 | Discharge: 2019-05-23 | Disposition: A | Discharge status: Home / Self Care | Payer: Managed Care, Other (non HMO) | Source: Ambulatory Visit | Attending: Obstetrics and Gynecology | Admitting: Obstetrics and Gynecology

## 2019-05-23 ENCOUNTER — Other Ambulatory Visit: Payer: Self-pay

## 2019-05-23 DIAGNOSIS — O99019 Anemia complicating pregnancy, unspecified trimester: Secondary | ICD-10-CM | POA: Diagnosis not present

## 2019-05-23 MED ORDER — SODIUM CHLORIDE 0.9 % IV SOLN
510.0000 mg | INTRAVENOUS | Status: DC
  Administered 2019-05-23: 510 mg via INTRAVENOUS
  Filled 2019-05-23: qty 17

## 2019-07-04 LAB — OB RESULTS CONSOLE GBS: GBS: NEGATIVE

## 2019-07-09 ENCOUNTER — Other Ambulatory Visit: Payer: Self-pay | Admitting: Obstetrics and Gynecology

## 2019-07-09 ENCOUNTER — Telehealth (HOSPITAL_COMMUNITY): Payer: Self-pay | Admitting: *Deleted

## 2019-07-09 ENCOUNTER — Encounter (HOSPITAL_COMMUNITY): Payer: Self-pay | Admitting: *Deleted

## 2019-07-09 NOTE — Telephone Encounter (Signed)
Preadmission screen  

## 2019-07-12 ENCOUNTER — Other Ambulatory Visit: Payer: Self-pay | Admitting: Obstetrics and Gynecology

## 2019-07-22 ENCOUNTER — Other Ambulatory Visit: Payer: Self-pay

## 2019-07-22 ENCOUNTER — Other Ambulatory Visit (HOSPITAL_COMMUNITY)
Admission: RE | Admit: 2019-07-22 | Discharge: 2019-07-22 | Disposition: A | Discharge status: Home / Self Care | Payer: Medicaid Other | Source: Ambulatory Visit | Attending: Obstetrics | Admitting: Obstetrics

## 2019-07-22 NOTE — MAU Note (Signed)
Covid swab collected. PT tolerated well.Pt asymptomatic 

## 2019-07-23 ENCOUNTER — Other Ambulatory Visit: Payer: Self-pay

## 2019-07-23 ENCOUNTER — Other Ambulatory Visit (HOSPITAL_COMMUNITY): Admission: RE | Admit: 2019-07-23 | Payer: Managed Care, Other (non HMO) | Source: Ambulatory Visit

## 2019-07-23 ENCOUNTER — Inpatient Hospital Stay (HOSPITAL_COMMUNITY)
Admission: AD | Admit: 2019-07-23 | Discharge: 2019-07-26 | DRG: 807 | Disposition: A | Discharge status: Home / Self Care | Payer: Medicaid Other | Attending: Obstetrics and Gynecology | Admitting: Obstetrics and Gynecology

## 2019-07-23 ENCOUNTER — Inpatient Hospital Stay (HOSPITAL_COMMUNITY): Payer: Medicaid Other

## 2019-07-23 ENCOUNTER — Encounter (HOSPITAL_COMMUNITY): Payer: Self-pay

## 2019-07-23 DIAGNOSIS — Z3A39 39 weeks gestation of pregnancy: Secondary | ICD-10-CM

## 2019-07-23 DIAGNOSIS — D649 Anemia, unspecified: Secondary | ICD-10-CM | POA: Diagnosis present

## 2019-07-23 DIAGNOSIS — Z20828 Contact with and (suspected) exposure to other viral communicable diseases: Secondary | ICD-10-CM | POA: Diagnosis present

## 2019-07-23 DIAGNOSIS — O9902 Anemia complicating childbirth: Principal | ICD-10-CM | POA: Diagnosis present

## 2019-07-23 DIAGNOSIS — O26893 Other specified pregnancy related conditions, third trimester: Secondary | ICD-10-CM | POA: Diagnosis present

## 2019-07-23 LAB — CBC
HCT: 34.8 % — ABNORMAL LOW (ref 36.0–46.0)
Hemoglobin: 11.4 g/dL — ABNORMAL LOW (ref 12.0–15.0)
MCH: 27.9 pg (ref 26.0–34.0)
MCHC: 32.8 g/dL (ref 30.0–36.0)
MCV: 85.1 fL (ref 80.0–100.0)
Platelets: 250 10*3/uL (ref 150–400)
RBC: 4.09 MIL/uL (ref 3.87–5.11)
RDW: 19.4 % — ABNORMAL HIGH (ref 11.5–15.5)
WBC: 6.6 10*3/uL (ref 4.0–10.5)
nRBC: 0.3 % — ABNORMAL HIGH (ref 0.0–0.2)

## 2019-07-23 LAB — SARS CORONAVIRUS 2 (TAT 6-24 HRS): SARS Coronavirus 2: NEGATIVE

## 2019-07-23 MED ORDER — OXYTOCIN BOLUS FROM INFUSION
500.0000 mL | Freq: Once | INTRAVENOUS | Status: AC
  Administered 2019-07-24: 500 mL via INTRAVENOUS

## 2019-07-23 MED ORDER — TERBUTALINE SULFATE 1 MG/ML IJ SOLN: 0.2500 mg | Freq: Once | INTRAMUSCULAR | Status: DC | PRN

## 2019-07-23 MED ORDER — FENTANYL-BUPIVACAINE-NACL 0.5-0.125-0.9 MG/250ML-% EP SOLN
12.0000 mL/h | EPIDURAL | Status: DC | PRN
  Filled 2019-07-23: qty 250

## 2019-07-23 MED ORDER — LIDOCAINE HCL (PF) 1 % IJ SOLN: 30.0000 mL | INTRAMUSCULAR | Status: DC | PRN

## 2019-07-23 MED ORDER — BUTORPHANOL TARTRATE 1 MG/ML IJ SOLN
1.0000 mg | INTRAMUSCULAR | Status: DC | PRN
  Administered 2019-07-24: 1 mg via INTRAVENOUS
  Filled 2019-07-23: qty 1

## 2019-07-23 MED ORDER — MISOPROSTOL 25 MCG QUARTER TABLET
25.0000 ug | ORAL_TABLET | ORAL | Status: DC | PRN
  Administered 2019-07-23: 25 ug via VAGINAL
  Filled 2019-07-23: qty 1

## 2019-07-23 MED ORDER — DIPHENHYDRAMINE HCL 50 MG/ML IJ SOLN: 12.5000 mg | INTRAMUSCULAR | Status: DC | PRN

## 2019-07-23 MED ORDER — PHENYLEPHRINE 40 MCG/ML (10ML) SYRINGE FOR IV PUSH (FOR BLOOD PRESSURE SUPPORT): 80.0000 ug | PREFILLED_SYRINGE | INTRAVENOUS | Status: DC | PRN

## 2019-07-23 MED ORDER — SOD CITRATE-CITRIC ACID 500-334 MG/5ML PO SOLN: 30.0000 mL | ORAL | Status: DC | PRN

## 2019-07-23 MED ORDER — EPHEDRINE 5 MG/ML INJ: 10.0000 mg | INTRAVENOUS | Status: DC | PRN

## 2019-07-23 MED ORDER — LACTATED RINGERS IV SOLN: 500.0000 mL | INTRAVENOUS | Status: DC | PRN

## 2019-07-23 MED ORDER — OXYTOCIN 40 UNITS IN NORMAL SALINE INFUSION - SIMPLE MED
1.0000 m[IU]/min | INTRAVENOUS | Status: DC
  Administered 2019-07-24: 2 m[IU]/min via INTRAVENOUS
  Filled 2019-07-23: qty 1000

## 2019-07-23 MED ORDER — PHENYLEPHRINE 40 MCG/ML (10ML) SYRINGE FOR IV PUSH (FOR BLOOD PRESSURE SUPPORT)
80.0000 ug | PREFILLED_SYRINGE | INTRAVENOUS | Status: DC | PRN
  Filled 2019-07-23: qty 10

## 2019-07-23 MED ORDER — LACTATED RINGERS IV SOLN: 500.0000 mL | Freq: Once | INTRAVENOUS | Status: DC

## 2019-07-23 MED ORDER — ONDANSETRON HCL 4 MG/2ML IJ SOLN: 4.0000 mg | Freq: Four times a day (QID) | INTRAMUSCULAR | Status: DC | PRN

## 2019-07-23 MED ORDER — OXYTOCIN 40 UNITS IN NORMAL SALINE INFUSION - SIMPLE MED
2.5000 [IU]/h | INTRAVENOUS | Status: DC
  Administered 2019-07-24: 2.5 [IU]/h via INTRAVENOUS

## 2019-07-23 MED ORDER — ACETAMINOPHEN 325 MG PO TABS
650.0000 mg | ORAL_TABLET | ORAL | Status: DC | PRN
  Administered 2019-07-23: 650 mg via ORAL
  Filled 2019-07-23: qty 2

## 2019-07-23 MED ORDER — LACTATED RINGERS IV SOLN
INTRAVENOUS | Status: DC
  Administered 2019-07-23: 20:00:00 via INTRAVENOUS

## 2019-07-23 NOTE — H&P (Signed)
33 y.o. [redacted]w[redacted]d  [id number] comes in for elective induction.  Otherwise has good fetal movement and no bleeding.  Past Medical History:  Diagnosis Date  . Anxiety   . Cervical dysplasia   . Depression    pp for 3 mos; was on medication  . Genital warts    h/o cryo   . Menorrhagia   . Panic attacks   . Vaginal Pap smear, abnormal     Past Surgical History:  Procedure Laterality Date  . CERVICAL BIOPSY  W/ LOOP ELECTRODE EXCISION    . DENTAL SURGERY  July 2014, September 2013   wisdom tooth extraction; other oral surgery    OB History  Gravida Para Term Preterm AB Living  5 4 4  0 0 4  SAB TAB Ectopic Multiple Live Births  0 0 0 0 4    # Outcome Date GA Lbr Len/2nd Weight Sex Delivery Anes PTL Lv  5 Current           4 Term 08/12/15 [redacted]w[redacted]d 11:45 / 00:17 30[address] g [address] EPI  LIV  3 Term 10/24/13 [redacted]w[redacted]d 12:22 / 00:18 3459 g M Vag-Spont EPI  LIV  2 Term 03/07/12 [redacted]w[redacted]d 19:30 / 00:30 2614 g M Vag-Spont EPI  LIV  1 Term 05/24/07 [redacted]w[redacted]d 24:00 2551 g F Vag-Spont EPI  LIV    Social History   Socioeconomic History  . Marital status: Single    Spouse name: Not on file  . Number of children: 4  . Years of education: Not on file  . Highest education level: Not on file  Occupational History  . Occupation: Therapist, art  Social Needs  . Financial resource strain: Not on file  . Food insecurity    Worry: Not on file    Inability: Not on file  . Transportation needs    Medical: Not on file    Non-medical: Not on file  Tobacco Use  . Smoking status: Never Smoker  . Smokeless tobacco: Never Used  Substance and Sexual Activity  . Alcohol use: Yes    Comment: social  . Drug use: No  . Sexual activity: Not on file  Lifestyle  . Physical activity    Days per week: Not on file    Minutes per session: Not on file  . Stress: Not on file  Relationships  . Social Herbalist on phone: Not on file    Gets together: Not on file    Attends religious service: Not on file   Active member of club or organization: Not on file    Attends meetings of clubs or organizations: Not on file    Relationship status: Not on file  . Intimate partner violence    Fear of current or ex partner: Not on file    Emotionally abused: Not on file    Physically abused: Not on file    Forced sexual activity: Not on file  Other Topics Concern  . Not on file  Social History Narrative  . Not on file   Patient has no known allergies.    Prenatal Transfer Tool  Maternal Diabetes: No Genetic Screening: Normal Maternal Ultrasounds/Referrals: Normal Fetal Ultrasounds or other Referrals:  None Maternal Substance Abuse:  No Significant Maternal Medications:  None Significant Maternal Lab Results: Group B Strep negative  Other PNC: uncomplicated.    Vitals:   07/23/19 2021 07/23/19 2027  BP:  129/83  Pulse:  80  Resp:  17  Temp:  98.8 F (37.1 C)  TempSrc:  Oral  Weight: 96.6 kg   Height: 5\' 4"  (1.626 m)     Lungs/Cor:  NAD Abdomen:  soft, gravid Ex:  no cords, erythema SVE:  1/th/high per nurse. FHTs:  120s, good STV, NST R; Cat 1 tracing. Toco:  q occ   A/P   Elective induction at term in multip.   GBS neg.  

## 2019-07-24 ENCOUNTER — Encounter (HOSPITAL_COMMUNITY): Payer: Self-pay | Admitting: Anesthesiology

## 2019-07-24 ENCOUNTER — Encounter (HOSPITAL_COMMUNITY): Payer: Self-pay

## 2019-07-24 ENCOUNTER — Inpatient Hospital Stay (HOSPITAL_COMMUNITY): Payer: Medicaid Other | Admitting: Anesthesiology

## 2019-07-24 LAB — RPR: RPR Ser Ql: NONREACTIVE

## 2019-07-24 MED ORDER — TETANUS-DIPHTH-ACELL PERTUSSIS 5-2.5-18.5 LF-MCG/0.5 IM SUSP: 0.5000 mL | Freq: Once | INTRAMUSCULAR | Status: DC

## 2019-07-24 MED ORDER — COCONUT OIL OIL: 1.0000 "application " | TOPICAL_OIL | Status: DC | PRN

## 2019-07-24 MED ORDER — SODIUM CHLORIDE 0.9% FLUSH
3.0000 mL | Freq: Two times a day (BID) | INTRAVENOUS | Status: DC
  Administered 2019-07-25: 3 mL via INTRAVENOUS

## 2019-07-24 MED ORDER — METHYLERGONOVINE MALEATE 0.2 MG PO TABS: 0.2000 mg | ORAL_TABLET | ORAL | Status: DC | PRN

## 2019-07-24 MED ORDER — MAGNESIUM HYDROXIDE 400 MG/5ML PO SUSP: 30.0000 mL | ORAL | Status: DC | PRN

## 2019-07-24 MED ORDER — METHYLERGONOVINE MALEATE 0.2 MG/ML IJ SOLN: 0.2000 mg | INTRAMUSCULAR | Status: DC | PRN

## 2019-07-24 MED ORDER — OXYCODONE-ACETAMINOPHEN 5-325 MG PO TABS
2.0000 | ORAL_TABLET | ORAL | Status: DC | PRN
  Administered 2019-07-24: 2 via ORAL
  Filled 2019-07-24: qty 2

## 2019-07-24 MED ORDER — ONDANSETRON 4 MG PO TBDP: 8.0000 mg | ORAL_TABLET | Freq: Three times a day (TID) | ORAL | Status: DC | PRN

## 2019-07-24 MED ORDER — METOCLOPRAMIDE HCL 10 MG PO TABS
10.0000 mg | ORAL_TABLET | Freq: Three times a day (TID) | ORAL | Status: DC
  Filled 2019-07-24 (×2): qty 1

## 2019-07-24 MED ORDER — PRENATAL MULTIVITAMIN CH
1.0000 | ORAL_TABLET | Freq: Every day | ORAL | Status: DC
  Administered 2019-07-25 – 2019-07-26 (×2): 1 via ORAL
  Filled 2019-07-24 (×2): qty 1

## 2019-07-24 MED ORDER — SODIUM CHLORIDE 0.9 % IV SOLN: 250.0000 mL | INTRAVENOUS | Status: DC | PRN

## 2019-07-24 MED ORDER — IBUPROFEN 600 MG PO TABS
600.0000 mg | ORAL_TABLET | Freq: Four times a day (QID) | ORAL | Status: DC
  Administered 2019-07-24 – 2019-07-26 (×7): 600 mg via ORAL
  Filled 2019-07-24 (×8): qty 1

## 2019-07-24 MED ORDER — SENNOSIDES-DOCUSATE SODIUM 8.6-50 MG PO TABS
2.0000 | ORAL_TABLET | ORAL | Status: DC
  Administered 2019-07-25 (×2): 2 via ORAL
  Filled 2019-07-24 (×2): qty 2

## 2019-07-24 MED ORDER — SODIUM CHLORIDE 0.9% FLUSH: 3.0000 mL | INTRAVENOUS | Status: DC | PRN

## 2019-07-24 MED ORDER — DIPHENHYDRAMINE HCL 25 MG PO CAPS
25.0000 mg | ORAL_CAPSULE | Freq: Four times a day (QID) | ORAL | Status: DC | PRN
  Administered 2019-07-25: 25 mg via ORAL
  Filled 2019-07-24: qty 1

## 2019-07-24 MED ORDER — ZOLPIDEM TARTRATE 5 MG PO TABS: 5.0000 mg | ORAL_TABLET | Freq: Every evening | ORAL | Status: DC | PRN

## 2019-07-24 MED ORDER — OXYCODONE-ACETAMINOPHEN 7.5-325 MG PO TABS
1.0000 | ORAL_TABLET | Freq: Four times a day (QID) | ORAL | Status: AC | PRN
  Administered 2019-07-24: 21:00:00 2 via ORAL
  Administered 2019-07-24: 1 via ORAL
  Administered 2019-07-25 – 2019-07-26 (×4): 2 via ORAL
  Filled 2019-07-24: qty 1
  Filled 2019-07-24 (×6): qty 2

## 2019-07-24 MED ORDER — ACETAMINOPHEN 325 MG PO TABS: 650.0000 mg | ORAL_TABLET | ORAL | Status: DC | PRN

## 2019-07-24 MED ORDER — CYCLOBENZAPRINE HCL 5 MG PO TABS
5.0000 mg | ORAL_TABLET | Freq: Three times a day (TID) | ORAL | Status: DC | PRN
  Administered 2019-07-24 – 2019-07-25 (×3): 5 mg via ORAL
  Filled 2019-07-24 (×5): qty 1

## 2019-07-24 MED ORDER — OXYCODONE-ACETAMINOPHEN 7.5-325 MG PO TABS: 2.0000 | ORAL_TABLET | Freq: Four times a day (QID) | ORAL | Status: DC | PRN

## 2019-07-24 MED ORDER — BENZOCAINE-MENTHOL 20-0.5 % EX AERO: 1.0000 "application " | INHALATION_SPRAY | CUTANEOUS | Status: DC | PRN

## 2019-07-24 MED ORDER — SODIUM CHLORIDE (PF) 0.9 % IJ SOLN
INTRAMUSCULAR | Status: DC | PRN
  Administered 2019-07-24: 12 mL/h via EPIDURAL

## 2019-07-24 MED ORDER — LIDOCAINE-EPINEPHRINE (PF) 2 %-1:200000 IJ SOLN
INTRAMUSCULAR | Status: DC | PRN
  Administered 2019-07-24 (×2): 2 mL via EPIDURAL

## 2019-07-24 MED ORDER — DIBUCAINE (PERIANAL) 1 % EX OINT
1.0000 "application " | TOPICAL_OINTMENT | CUTANEOUS | Status: DC | PRN
  Administered 2019-07-25: 1 via RECTAL
  Filled 2019-07-24: qty 28

## 2019-07-24 MED ORDER — IBUPROFEN 800 MG PO TABS
800.0000 mg | ORAL_TABLET | Freq: Three times a day (TID) | ORAL | Status: DC
  Administered 2019-07-24: 800 mg via ORAL
  Filled 2019-07-24: qty 1

## 2019-07-24 MED ORDER — SIMETHICONE 80 MG PO CHEW: 80.0000 mg | CHEWABLE_TABLET | ORAL | Status: DC | PRN

## 2019-07-24 MED ORDER — GLYCOPYRROLATE 1 MG PO TABS
1.0000 mg | ORAL_TABLET | Freq: Two times a day (BID) | ORAL | Status: DC
  Filled 2019-07-24 (×6): qty 1

## 2019-07-24 MED ORDER — FERROUS SULFATE 325 (65 FE) MG PO TABS
325.0000 mg | ORAL_TABLET | Freq: Two times a day (BID) | ORAL | Status: DC
  Administered 2019-07-24 – 2019-07-26 (×4): 325 mg via ORAL
  Filled 2019-07-24 (×4): qty 1

## 2019-07-24 MED ORDER — MEASLES, MUMPS & RUBELLA VAC IJ SOLR: 0.5000 mL | Freq: Once | INTRAMUSCULAR | Status: DC

## 2019-07-24 MED ORDER — WITCH HAZEL-GLYCERIN EX PADS
1.0000 "application " | MEDICATED_PAD | CUTANEOUS | Status: DC | PRN
  Administered 2019-07-25: 1 via TOPICAL

## 2019-07-24 MED ORDER — IBUPROFEN 600 MG PO TABS: 600.0000 mg | ORAL_TABLET | Freq: Four times a day (QID) | ORAL | Status: DC

## 2019-07-24 NOTE — Progress Notes (Signed)
Pt continues to complain of 9/10 back and abdominal pain. Pt describes the pain as throbbing. Nurse tech assisted pt to bathroom and patient was doubled over in pain. Fundal check was performed at this time. Uterus firm, U/1 with scant amount of bleeding. Heat packs provided and provider notified.

## 2019-07-24 NOTE — Anesthesia Preprocedure Evaluation (Signed)
Anesthesia Evaluation  Patient identified by MRN, date of birth, ID band Patient awake    Reviewed: Allergy & Precautions, NPO status , Patient's Chart, lab work & pertinent test results  Airway Mallampati: II  TM Distance: >3 FB Neck ROM: Full    Dental no notable dental hx.    Pulmonary neg pulmonary ROS,    Pulmonary exam normal breath sounds clear to auscultation       Cardiovascular negative cardio ROS Normal cardiovascular exam Rhythm:Regular Rate:Normal     Neuro/Psych PSYCHIATRIC DISORDERS Anxiety Depression negative neurological ROS     GI/Hepatic negative GI ROS, Neg liver ROS,   Endo/Other  negative endocrine ROS  Renal/GU negative Renal ROS  negative genitourinary   Musculoskeletal negative musculoskeletal ROS (+)   Abdominal   Peds  Hematology  (+) Blood dyscrasia (Hgb 11.4), anemia ,   Anesthesia Other Findings   Reproductive/Obstetrics (+) Pregnancy                             Anesthesia Physical Anesthesia Plan  ASA: II  Anesthesia Plan: Epidural   Post-op Pain Management:    Induction:   PONV Risk Score and Plan: Treatment may vary due to age or medical condition  Airway Management Planned: Natural Airway  Additional Equipment:   Intra-op Plan:   Post-operative Plan:   Informed Consent: I have reviewed the patients History and Physical, chart, labs and discussed the procedure including the risks, benefits and alternatives for the proposed anesthesia with the patient or authorized representative who has indicated his/her understanding and acceptance.       Plan Discussed with: Anesthesiologist  Anesthesia Plan Comments: (Patient identified. Risks, benefits, options discussed with patient including but not limited to bleeding, infection, nerve damage, paralysis, failed block, incomplete pain control, headache, blood pressure changes, nausea, vomiting,  reactions to medication, itching, and post partum back pain. Confirmed with bedside nurse the patient's most recent platelet count. Confirmed with the patient that they are not taking any anticoagulation, have any bleeding history or any family history of bleeding disorders. Patient expressed understanding and wishes to proceed. All questions were answered. )        Anesthesia Quick Evaluation

## 2019-07-24 NOTE — Progress Notes (Signed)
Informed Dr. Ambrose Pancoast of patient complaining of 9/10 pain in back near insertion site of epidural. Dr. Ambrose Pancoast to evaluate patient.

## 2019-07-24 NOTE — Anesthesia Procedure Notes (Signed)
Epidural Patient location during procedure: OB Start time: 07/24/2019 1:30 AM End time: 07/24/2019 1:45 AM  Staffing Anesthesiologist: Freddrick March, MD Performed: anesthesiologist   Preanesthetic Checklist Completed: patient identified, pre-op evaluation, timeout performed, IV checked, risks and benefits discussed and monitors and equipment checked  Epidural Patient position: sitting Prep: site prepped and draped and DuraPrep Patient monitoring: continuous pulse ox, blood pressure, heart rate and cardiac monitor Approach: midline Location: L3-L4 Injection technique: LOR air  Needle:  Needle type: Tuohy  Needle gauge: 17 G Needle length: 9 cm Needle insertion depth: 5.5 cm Catheter type: closed end flexible Catheter size: 19 Gauge Catheter at skin depth: 12 cm Test dose: negative  Assessment Sensory level: T8 Events: blood not aspirated, injection not painful, no injection resistance, negative IV test and no paresthesia  Additional Notes Patient identified. Risks/Benefits/Options discussed with patient including but not limited to bleeding, infection, nerve damage, paralysis, failed block, incomplete pain control, headache, blood pressure changes, nausea, vomiting, reactions to medication both or allergic, itching and postpartum back pain. Confirmed with bedside nurse the patient's most recent platelet count. Confirmed with patient that they are not currently taking any anticoagulation, have any bleeding history or any family history of bleeding disorders. Patient expressed understanding and wished to proceed. All questions were answered. Sterile technique was used throughout the entire procedure. Please see nursing notes for vital signs. Test dose was given through epidural catheter and negative prior to continuing to dose epidural or start infusion. Warning signs of high block given to the patient including shortness of breath, tingling/numbness in hands, complete motor  block, or any concerning symptoms with instructions to call for help. Patient was given instructions on fall risk and not to get out of bed. All questions and concerns addressed with instructions to call with any issues or inadequate analgesia.  Reason for block:procedure for pain

## 2019-07-24 NOTE — Progress Notes (Signed)
Pt states her epidural is not working like it should be. Pt denies feeling pain from contractions. Pt states she has had an epidural before and this epidural is not making her legs numb like in the past. Pt states she needs the foley cathter d/c'd because she cannot stand it anymore. RN assessed epidural level and epidural level is appropriate at T-10. RN educated pt about epidural use and effectiveness. Pt given PCA button to push is needed for pain.

## 2019-07-24 NOTE — Progress Notes (Signed)
S: called to se patient for back pain rates it as 9/10  O:  Para-spinous back pain in area around epidural placement.  No rubor calor or tumor noted  but significant dolor at epidural site no radiating.  A/P:  I have reviewed epidural placement and do not believe that it was a difficult placement, however pt states that she did not get adequate pain relief even with repeated dosing of PCA.  She states that while she had some front sided numbness she did not have any relief in abdominal contraction pain for relief in her bottom.  She feel that this epidural did not work like prior ones. I plan to increase her motrin to q 6 hours not prn.  Increase to percocet 7.5's q 4-6 hours and add a muscle relaxant with follow up by one of my partners or CRNA's later.      Dianna Rossetti MD [phone]

## 2019-07-24 NOTE — Anesthesia Postprocedure Evaluation (Signed)
Anesthesia Post Note  Patient: Morgan Quinn  Procedure(s) Performed: AN AD [place]     Patient location during evaluation: Mother Baby Anesthesia Type: Epidural Level of consciousness: awake and alert Pain management: pain level controlled Vital Signs Assessment: post-procedure vital signs reviewed and stable Respiratory status: spontaneous breathing, nonlabored ventilation and respiratory function stable Cardiovascular status: stable Postop Assessment: no headache, no backache and epidural receding Anesthetic complications: no    Last Vitals:  Vitals:   07/24/19 0730 07/24/19 0731  BP: 126/78 117/90  Pulse: (!) 121 98  Resp:    Temp:    SpO2:      Last Pain:  Vitals:   07/24/19 0631  TempSrc:   PainSc: Asleep                 Rikia Sukhu

## 2019-07-25 ENCOUNTER — Inpatient Hospital Stay (HOSPITAL_COMMUNITY): Payer: Medicaid Other

## 2019-07-25 LAB — CBC
HCT: 31.8 % — ABNORMAL LOW (ref 36.0–46.0)
Hemoglobin: 10.4 g/dL — ABNORMAL LOW (ref 12.0–15.0)
MCH: 27.7 pg (ref 26.0–34.0)
MCHC: 32.7 g/dL (ref 30.0–36.0)
MCV: 84.8 fL (ref 80.0–100.0)
Platelets: 215 10*3/uL (ref 150–400)
RBC: 3.75 MIL/uL — ABNORMAL LOW (ref 3.87–5.11)
RDW: 19.7 % — ABNORMAL HIGH (ref 11.5–15.5)
WBC: 8 10*3/uL (ref 4.0–10.5)
nRBC: 0 % (ref 0.0–0.2)

## 2019-07-25 MED ORDER — RHO D IMMUNE GLOBULIN 1500 UNIT/2ML IJ SOSY
300.0000 ug | PREFILLED_SYRINGE | Freq: Once | INTRAMUSCULAR | Status: AC
  Administered 2019-07-25: 300 ug via INTRAVENOUS
  Filled 2019-07-25: qty 2

## 2019-07-25 NOTE — Anesthesia Postprocedure Evaluation (Signed)
Anesthesia Post Note  Patient: Morgan Quinn  Procedure(s) Performed: note     Patient location during evaluation: Mother Baby Anesthesia Type: Epidural Level of consciousness: awake Pain management: satisfactory to patient Vital Signs Assessment: post-procedure vital signs reviewed and stable Respiratory status: spontaneous breathing Cardiovascular status: stable Anesthetic complications: no    Last Vitals:  Vitals:   07/24/19 1850 07/25/19 0516  BP: 129/86 108/90  Pulse: 67 68  Resp: 18 16  Temp: 36.8 C 36.7 C  SpO2: 100%     Last Pain:  Vitals:   07/25/19 0750  TempSrc:   PainSc: 7    Pain Goal: Patients Stated Pain Goal: 6 (07/24/19 2251)  Pt states her back is better today,just stiff and sore.  Pt encouraged to apply heat via a warm washcloth to the area.                   Casimer Lanius

## 2019-07-25 NOTE — Progress Notes (Signed)
Patient is eating, ambulating, voiding.  Pain control is good.  Appropriate lochia.  Still reporting back pain/soreness, related to epidural.  Per pt anesthesia has been managing.  Vitals:   07/24/19 1035 07/24/19 1430 07/24/19 1850 07/25/19 0516  BP: 121/89 132/85 129/86 108/90  Pulse: 64 62 67 68  Resp:  16 18 16   Temp: 98.1 F (36.7 C) 97.6 F (36.4 C) 98.2 F (36.8 C) 98 F (36.7 C)  TempSrc: Oral Oral Oral Oral  SpO2: 96% 98% 100%   Weight:      Height:        Fundus firm Abd: soft, nontender Ext: no calf tenderness  Lab Results  Component Value Date   WBC 8.0 07/25/2019   HGB 10.4 (L) 07/25/2019   HCT 31.8 (L) 07/25/2019   MCV 84.8 07/25/2019   PLT 215 07/25/2019    --/--/O NEG (10/13 2010)  A/P Post partum day 1. Anesthesia to f/u with pt re back pain, pt using narcotics/muscle relaxant and antiinflammatory with heating pad. circ desired, reviewed elective nature, risks and potential complications  Routine care.  Expect d/c 10/16    Morgan Quinn

## 2019-07-25 NOTE — Lactation Note (Signed)
This note was copied from a baby's chart. Lactation Consultation Note  Patient Name: Morgan Quinn Date: 07/25/2019  baby Morgan, Lord. Now 57 hours old. Mom with history anxiety and depression. Did not see LCs' previous note until past seeing mom.  Mom denies need for lactation services.  Says she has a breastpump at home that she breastfed and bottlefed her last two for 3 months.  Never has much milk.  Gave cone breastfeeding handouts. Urged her to call lactation as needed   Maternal Data    Feeding Feeding Type: Bottle Fed - Formula Nipple Type: Slow - flow  LATCH Score                   Interventions    Lactation Tools Discussed/Used     Consult Status      Manpreet Kemmer Thompson Caul 07/25/2019, 7:52 PM

## 2019-07-25 NOTE — Lactation Note (Signed)
This note was copied from a baby's chart. Lactation Consultation Note Baby 8 hrs old. Mom taking a lot of medication to cause drowsiness. Has no support person at bedside. Baby in CN, mom sleeping. Will be f/u later today.  Patient Name: Morgan Quinn [id number] Date: 07/25/2019     Maternal Data    Feeding Feeding Type: Bottle Fed - Formula Nipple Type: Slow - flow  LATCH Score                   Interventions    Lactation Tools Discussed/Used     Consult Status      Trisa Cranor G 07/25/2019, 3:12 AM

## 2019-07-25 NOTE — Clinical Social Work Maternal (Signed)
CLINICAL SOCIAL WORK MATERNAL/CHILD NOTE  Patient Details  Name: Morgan Quinn MRN: [id number] Date of Birth: 09/02/1986  Date:  07/25/2019  Clinical Social Worker Initiating Note:  Tifanny Dollens Date/Time: Initiated:  07/25/19/1325     Child's Name:  Lorde Guevara   Biological Parents:  Mother, Father(Morgan Quinn and Ricardo Guevara (FOB not involved at this time))   Need for Interpreter:  None   Reason for Referral:  Behavioral Health Concerns([place] 18)   Address:  [address] [place] [address] [address]    Phone number:  [phone] (home)     Additional phone number:   Household Members/Support Persons (HM/SP):   Household Member/Support Person 1, Household Member/Support Person 2, Household Member/Support Person 3, Household Member/Support Person 4   HM/SP Name Relationship DOB or Age  HM/SP -1 Maddox Creed Son 10/24/2013  HM/SP -2 Quinn Wright Daughter 05/24/2007  HM/SP -3 Carson Pickart Son 03/07/2012  HM/SP -4 Axel Creed Son 08/12/2015  HM/SP -5        HM/SP -6        HM/SP -7        HM/SP -8          Natural Supports (not living in the home):  Friends, Immediate Family   Professional Supports: Therapist(MOB in between therapists but has connected with therapist from before through [facility] - Janet Cecil)   Employment: Unemployed   Type of Work:     Education:  Some College   Homebound arranged:    Financial Resources:  Private Insurance   Other Resources:  Food Stamps    Cultural/Religious Considerations Which May Impact Care:    Strengths:  Ability to meet basic needs , Home prepared for child , Pediatrician chosen   Psychotropic Medications:         Pediatrician:    [place] area  Pediatrician List:   [facility]  [place]    [place]    [place]    [place]    [place]      Pediatrician Fax Number:    Risk Factors/Current Problems:  Mental Health Concerns     Cognitive State:  Able to Concentrate , Alert , Linear Thinking    Mood/Affect:  Bright , Calm , Comfortable , Interested , Relaxed    CSW Assessment:  CSW received consult for history of anxiety and depression and [place] 18.  CSW met with MOB to offer support and complete assessment.    MOB resting in bed holding infant, when CSW entered the room. CSW introduced self and explained reason for consult to which MOB expressed understanding. MOB very pleasant and engaged throughout assessment and was appropriate and attentive to infant during visit. MOB reported she currently lives in [place] with her four other children. 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 MOB reported she had PPD in 2013 after a break up with that child's father and described symptoms of feeling sad, crying a lot, loss of appetite and difficulty bonding. MOB stated she also experienced PPD following birth of child in 2015. MOB shared that her child was born in January and then MOB's father passed away in April and she went through another break up. MOB stated she "shut down for two years" and felt angry. CSW inquired about MOB's mental health during pregnancy and MOB acknowledged that she began to feel depressed around her 7th month of pregnancy. MOB stated she was feeling hopeless   and overwhelmed as a parent and with COVID19. MOB stated she was having difficulty bonding to the baby during her pregnancy and wasn't sure if she would place baby up for adoption after delivery. Per MOB, as delivery got closer she started to feel better and more excited about infant. MOB reported she feels like she and infant are bonded and that she is happy he is here. MOB denied being on any current medications but stated she recently weaned herself off of Zoloft after finding unexplained bruising. CSW inquired about if medications was something she was interested in and MOB stated  Xanax has worked for her in the past. CSW encouraged MOB to speak with her doctor at her follow up appointment. MOB reported she was seeing a therapist through virtual visits but described it as a difficult experience with 4 children. MOB stated she has reached out to a therapist she's met with in the past through [facility] and intends to make an appointment with her. CSW provided education regarding the baby blues period vs. perinatal mood disorders. CSW recommended self-evaluation during the postpartum time period using the New Mom Checklist from Postpartum Progress and encouraged MOB to contact a medical professional if symptoms are noted at any time. MOB reported feeling happy now and did not appear to be displaying any acute mental health symptoms. MOB denied any current SI, HI or DV and reported having support from a friend, her sister and her daughter. CSW offered to make Healthy Start Referral and CC4[facility] referral to offer additional support once discharged but MOB declined at this time.   MOB confirmed having all essential items for infant once discharged and reported infant would be sleeping in a crib once home. CSW provided review of Sudden Infant Death Syndrome (SIDS) precautions and safe sleeping habits. MOB denied any further questions, concerns or need for resources from CSW, at this time.    CSW Plan/Description:  No Further Intervention Required/No Barriers to Discharge, Sudden Infant Death Syndrome (SIDS) Education, Perinatal Mood and Anxiety Disorder (PMADs) Education    Yadira Hada, LCSWA 07/25/2019, 2:35 PM 

## 2019-07-25 NOTE — Lactation Note (Signed)
This note was copied from a baby's chart. Lactation Consultation Note  Patient Name: Morgan Quinn [id number] Date: 07/25/2019   Mom is a P5. Heide Guile, RN asked patient if she'd like to be seen by lactation. Mom does not want to be seen by Lactation while here; RN will let us know if Mom changes her mind.    Matthias Hughs [facility] 07/25/2019, 10:28 AM

## 2019-07-26 LAB — RH IG WORKUP (INCLUDES ABO/RH)
ABO/RH(D): O NEG
Fetal Screen: NEGATIVE
Gestational Age(Wks): 39.2
Unit division: 0

## 2019-07-26 LAB — RUBELLA SCREEN: Rubella: 4.09 index (ref >0.99)

## 2019-07-26 MED ORDER — IBUPROFEN 600 MG PO TABS: 600.0000 mg | ORAL_TABLET | Freq: Four times a day (QID) | ORAL | 1 refills | Status: DC

## 2019-07-26 MED ORDER — SENNOSIDES-DOCUSATE SODIUM 8.6-50 MG PO TABS: 2.0000 | ORAL_TABLET | ORAL | 11 refills | Status: DC

## 2019-07-26 MED ORDER — OXYCODONE-ACETAMINOPHEN 7.5-325 MG PO TABS: 1.0000 | ORAL_TABLET | Freq: Four times a day (QID) | ORAL | 0 refills | Status: DC | PRN

## 2019-07-26 MED ORDER — OXYCODONE-ACETAMINOPHEN 5-325 MG PO TABS
1.0000 | ORAL_TABLET | Freq: Once | ORAL | Status: AC
  Administered 2019-07-26: 1 via ORAL
  Filled 2019-07-26: qty 1

## 2019-07-26 MED ORDER — FERROUS SULFATE 325 (65 FE) MG PO TABS: 325.0000 mg | ORAL_TABLET | Freq: Every day | ORAL | 11 refills | Status: DC

## 2019-07-26 NOTE — Discharge Summary (Signed)
OB Discharge Summary     Patient Name: Morgan Quinn DOB: Aug 06, 1986 MRN: [id number]  Date of admission: 07/23/2019 Delivering MD: Carrington Clamp   Date of discharge: 07/26/2019  Admitting diagnosis: PREG Intrauterine pregnancy: [redacted]w[redacted]d     Secondary diagnosis:  Active Problems:   [redacted] weeks gestation of pregnancy  Additional problems: None     Discharge diagnosis: Term Pregnancy Delivered                                                                                                Post partum procedures:none  Augmentation: Pitocin and Cytotec  Complications: None  Hospital course:  Induction of Labor With Vaginal Delivery   33 y.o. yo G5P5005 at [redacted]w[redacted]d was admitted to the hospital 07/23/2019 for induction of labor.  Indication for induction: eIOL.  Patient had an uncomplicated labor course as follows: Admitted at 1cm, received cytotec, then pit. SROM'ed. Progressed along a normal labor curve.  Intrapartum Procedures: Episiotomy: None [1]                                         Lacerations:  None [1]  Patient had delivery of a Viable infant.  Information for the patient's newborn:  Shahla, Betsill [[id number]]  Delivery Method: Vaginal, Spontaneous(Filed from Delivery Summary)    07/24/2019  Details of delivery can be found in separate delivery note.  Patient had a routine postpartum course. Patient is discharged home 07/26/19.  Physical exam  Vitals:   07/25/19 2128 07/26/19 0627 07/26/19 1017 07/26/19 1400  BP:  (!) 145/103 122/79 136/89  Pulse:  72 91 72  Resp: 16 16    Temp: 98.3 F (36.8 C) 98 F (36.7 C)    TempSrc: Oral Oral    SpO2: 99%     Weight:      Height:       General: alert Lochia: appropriate Uterine Fundus: firm DVT Evaluation: No evidence of DVT seen on physical exam. Labs: Lab Results  Component Value Date   WBC 8.0 07/25/2019   HGB 10.4 (L) 07/25/2019   HCT 31.8 (L) 07/25/2019   MCV 84.8 07/25/2019   PLT 215 07/25/2019    CMP Latest Ref Rng & Units 12/14/2018  Glucose 70 - 99 mg/dL 782(N)  BUN 6 - 20 mg/dL 14  Creatinine 5.62 - 1.30 mg/dL 8.65  Sodium 784 - 696 mmol/L 135  Potassium 3.5 - 5.1 mmol/L 3.9  Chloride 98 - 111 mmol/L 105  CO2 22 - 32 mmol/L 23  Calcium 8.9 - 10.3 mg/dL 8.9  Total Protein 6.5 - 8.1 g/dL 7.1  Total Bilirubin 0.3 - 1.2 mg/dL 0.7  Alkaline Phos 38 - 126 U/L 41  AST 15 - 41 U/L 16  ALT 0 - 44 U/L 10    Discharge instruction: per After Visit Summary and "Baby and Me Booklet".  After visit meds:  Allergies as of 07/26/2019   No Known Allergies     Medication List    STOP  taking these medications   acetaminophen 500 MG tablet Commonly known as: TYLENOL   glycopyrrolate 1 MG tablet Commonly known as: Robinul   metoCLOPramide 10 MG tablet Commonly known as: REGLAN   ondansetron 8 MG disintegrating tablet Commonly known as: Zofran ODT     TAKE these medications   calcium carbonate 500 MG chewable tablet Commonly known as: TUMS - dosed in mg elemental calcium Chew 1-2 tablets by mouth as needed for indigestion or heartburn.   ferrous sulfate 325 (65 FE) MG tablet Take 1 tablet (325 mg total) by mouth daily.   ibuprofen 600 MG tablet Commonly known as: ADVIL Take 1 tablet (600 mg total) by mouth every 6 (six) hours.   multivitamin-prenatal 27-0.8 MG Tabs tablet Take 1 tablet by mouth daily at 12 noon.   oxyCODONE-acetaminophen 7.5-325 MG tablet Commonly known as: PERCOCET Take 1-2 tablets by mouth every 6 (six) hours as needed for moderate pain.   senna-docusate 8.6-50 MG tablet Commonly known as: Senokot-S Take 2 tablets by mouth daily. For constipation Start taking on: July 27, 2019   sertraline 50 MG tablet Commonly known as: ZOLOFT Take 50 mg by mouth daily. Patient stopped taking 2 weeks ago.       Diet: routine diet  Activity: Advance as tolerated. Pelvic rest for 6 weeks.   Outpatient follow up:4 weeks Follow up Appt:No future  appointments. Follow up Visit:No follow-ups on file.  Postpartum contraception: discuss at pp visit Newborn Data: Live born female  Birth Weight: 7 lb 2.6 oz (3249 g) APGAR: 8, 9  Newborn Delivery   Birth date/time: 07/24/2019 07:19:00 Delivery type: Vaginal, Spontaneous      Baby Feeding: Bottle and Breast Disposition:home with mother   07/26/2019 Jonelle Sidle, MD

## 2019-07-26 NOTE — Progress Notes (Signed)
Post Partum Day 2 Subjective: no complaints, up ad lib, voiding and tolerating PO  Reports HA earlier, now resolved, denies RUQ pain, SOB.  Objective: Patient Vitals for the past 24 hrs:  BP Temp Temp src Pulse Resp SpO2  07/26/19 1017 122/79 - - - - -  07/26/19 0627 (!) 145/103 98 F (36.7 C) Oral 72 16 -  07/25/19 2128 (!) 133/94 98.3 F (36.8 C) Oral 86 16 99 %    Physical Exam:  General: alert Lochia: appropriate Uterine Fundus: firm DVT Evaluation: No evidence of DVT seen on physical exam.  Recent Labs    07/23/19 2010 07/25/19 0547  HGB 11.4* 10.4*  HCT 34.8* 31.8*    Assessment/Plan: Eddie Dibbles 33 y.o. [id number] PPD#2 sp SVD 23/53 AM. Uncomplicated pregnancy.Doing well, Bps mild range last night/early this AM, now normotensive, if remains wnl will DC today.    LOS: 3 days   Nygeria Lager K Taam-Akelman 07/26/2019, 12:07 PM

## 2019-07-27 LAB — TYPE AND SCREEN
ABO/RH(D): O NEG
Antibody Screen: POSITIVE
Unit division: 0
Unit division: 0
Unit division: 0
Unit division: 0

## 2019-07-27 LAB — BPAM RBC
Blood Product Expiration Date: 202010242359
Blood Product Expiration Date: 202010252359
Blood Product Expiration Date: 202010252359
Blood Product Expiration Date: 202010272359
ISSUE DATE / TIME: 202010081010
ISSUE DATE / TIME: 202010081010
ISSUE DATE / TIME: 202010091359
ISSUE DATE / TIME: 202010091359
Unit Type and Rh: 9500
Unit Type and Rh: 9500
Unit Type and Rh: 9500
Unit Type and Rh: 9500

## 2021-01-31 ENCOUNTER — Other Ambulatory Visit: Payer: Self-pay

## 2021-01-31 ENCOUNTER — Emergency Department (HOSPITAL_BASED_OUTPATIENT_CLINIC_OR_DEPARTMENT_OTHER)
Admission: EM | Admit: 2021-01-31 | Discharge: 2021-01-31 | Disposition: A | Discharge status: Home / Self Care | Payer: 59 | Attending: Emergency Medicine | Admitting: Emergency Medicine

## 2021-01-31 DIAGNOSIS — J069 Acute upper respiratory infection, unspecified: Secondary | ICD-10-CM

## 2021-01-31 DIAGNOSIS — H9203 Otalgia, bilateral: Secondary | ICD-10-CM | POA: Insufficient documentation

## 2021-01-31 DIAGNOSIS — Z20828 Contact with and (suspected) exposure to other viral communicable diseases: Secondary | ICD-10-CM | POA: Insufficient documentation

## 2021-01-31 DIAGNOSIS — R059 Cough, unspecified: Secondary | ICD-10-CM | POA: Diagnosis present

## 2021-01-31 MED ORDER — BENZONATATE 100 MG PO CAPS: 100.0000 mg | ORAL_CAPSULE | Freq: Three times a day (TID) | ORAL | 0 refills | Status: DC

## 2021-01-31 MED ORDER — NAPROXEN 500 MG PO TABS: 500.0000 mg | ORAL_TABLET | Freq: Two times a day (BID) | ORAL | 0 refills | Status: DC

## 2021-01-31 MED ORDER — OSELTAMIVIR PHOSPHATE 75 MG PO CAPS: 75.0000 mg | ORAL_CAPSULE | Freq: Two times a day (BID) | ORAL | 0 refills | Status: DC

## 2021-01-31 NOTE — Discharge Instructions (Signed)
Likely had the flu.  I prescribed you Tamiflu as well as medications to help with your symptoms  return for new or worsening symptoms

## 2021-01-31 NOTE — ED Provider Notes (Signed)
[facility] EMERGENCY DEPARTMENT Provider Note   CSN: [id number] Arrival date & time: 01/31/21  1634     History Chief Complaint  Patient presents with  . Sore Throat  . Cough  . Headache    Morgan Quinn is a 35 y.o. female with no significant past medical history who presents for evaluation of flu exposure and upper respiratory complaints.  Patient states 2 children at home who have tested positive for flu.  She admits still diffuse headache, scratchy throat, cough, congestion, rhinorrhea with bilateral earache.  Took Tylenol for her headache which did not help.  She is unable to tolerate p.o. intake at home.  She denies any fever, blurred vision, paresthesias, weakness, chest pain, shortness of breath, abdominal pain, diarrhea, dysuria.  Cough is nonproductive.  No photophobia or phonophobia.  Denies additional aggravating or alleviating factors.  Headache is described as achy and throbbing  History obtained from patient past medical records.  No interpreter used  HPI     Past Medical History:  Diagnosis Date  . Anxiety   . Cervical dysplasia   . Depression    pp for 3 mos; was on medication  . Genital warts    h/o cryo   . Menorrhagia   . Panic attacks   . Vaginal Pap smear, abnormal     Patient Active Problem List   Diagnosis Date Noted  . [redacted] weeks gestation of pregnancy 07/23/2019  . Iron deficiency anemia due to chronic blood loss 08/09/2018  . Active labor 08/12/2015  . Spontaneous vaginal delivery 10/26/2013    Class: Status post    Past Surgical History:  Procedure Laterality Date  . CERVICAL BIOPSY  W/ LOOP ELECTRODE EXCISION    . DENTAL SURGERY  July 2014, September 2013   wisdom tooth extraction; other oral surgery     OB History    Gravida  5   Para  5   Term  5   Preterm  0   AB  0   Living  5     SAB  0   IAB  0   Ectopic  0   Multiple  0   Live Births  5           Family History  Problem Relation Age of  Onset  . Colon cancer Father 4232       died at age 649   . Prader-Willi syndrome Sister   . Diabetes Paternal Grandmother     Social History   Tobacco Use  . Smoking status: Never Smoker  . Smokeless tobacco: Never Used  Vaping Use  . Vaping Use: Never used  Substance Use Topics  . Alcohol use: Yes    Comment: social  . Drug use: No    Home Medications Prior to Admission medications   Medication Sig Start Date End Date Taking? Authorizing Provider  benzonatate (TESSALON) 100 MG capsule Take 1 capsule (100 mg total) by mouth every 8 (eight) hours. 01/31/21  Yes Waco Foerster A, PA-C  naproxen (NAPROSYN) 500 MG tablet Take 1 tablet (500 mg total) by mouth 2 (two) times daily. 01/31/21  Yes Trever Streater A, PA-C  oseltamivir (TAMIFLU) 75 MG capsule Take 1 capsule (75 mg total) by mouth every 12 (twelve) hours. 01/31/21  Yes Jovaun Levene A, PA-C  calcium carbonate (TUMS - DOSED IN MG ELEMENTAL CALCIUM) 500 MG chewable tablet Chew 1-2 tablets by mouth as needed for indigestion or heartburn.    [provider]  ferrous sulfate 325 (65 FE) MG tablet Take 1 tablet (325 mg total) by mouth daily. 07/26/19   Taam-Akelman, Griselda Miner, MD  ibuprofen (ADVIL) 600 MG tablet Take 1 tablet (600 mg total) by mouth every 6 (six) hours. 07/26/19   Taam-Akelman, Griselda Miner, MD  oxyCODONE-acetaminophen (PERCOCET) 7.5-325 MG tablet Take 1-2 tablets by mouth every 6 (six) hours as needed for moderate pain. 07/26/19   Taam-Akelman, Griselda Miner, MD  Prenatal Vit-Fe Fumarate-FA (MULTIVITAMIN-PRENATAL) 27-0.8 MG TABS tablet Take 1 tablet by mouth daily at 12 noon.    [provider]  senna-docusate (SENOKOT-S) 8.6-50 MG tablet Take 2 tablets by mouth daily. For constipation 07/27/19   Taam-Akelman, Griselda Miner, MD  sertraline (ZOLOFT) 50 MG tablet Take 50 mg by mouth daily. Patient stopped taking 2 weeks ago. 05/20/19   [provider]    Allergies    Patient has no known  allergies.  Review of Systems   Review of Systems  Constitutional: Positive for activity change, appetite change and fatigue.  HENT: Positive for congestion, postnasal drip, rhinorrhea, sinus pressure and sore throat. Negative for ear discharge, ear pain, trouble swallowing and voice change.   Respiratory: Positive for cough.   Cardiovascular: Negative.   Gastrointestinal: Negative.   Genitourinary: Negative.   Musculoskeletal: Positive for myalgias.  Skin: Negative.   Neurological: Positive for headaches.  All other systems reviewed and are negative.   Physical Exam Updated Vital Signs BP (!) 133/93 (BP Location: Right Arm)   Pulse 88   Temp (!) 97.5 F (36.4 C) (Tympanic)   SpO2 97%   Physical Exam Vitals and nursing note reviewed.  Constitutional:      General: She is not in acute distress.    Appearance: She is not ill-appearing, toxic-appearing or diaphoretic.  HENT:     Head: Normocephalic and atraumatic.     Jaw: There is normal jaw occlusion.     Right Ear: Tympanic membrane, ear canal and external ear normal. There is no impacted cerumen. No hemotympanum. Tympanic membrane is not injected, scarred, perforated, erythematous, retracted or bulging.     Left Ear: Tympanic membrane, ear canal and external ear normal. There is no impacted cerumen. No hemotympanum. Tympanic membrane is not injected, scarred, perforated, erythematous, retracted or bulging.     Ears:     Comments: No Mastoid tenderness.    Nose:     Comments: Clear rhinorrhea and congestion to bilateral nares.  No sinus tenderness.    Mouth/Throat:     Comments: Posterior oropharynx clear.  Mucous membranes moist.  Tonsils without erythema or exudate.  Uvula midline without deviation.  No evidence of PTA or RPA.  No drooling, dysphasia or trismus.  Phonation normal. Neck:     Trachea: Trachea and phonation normal.     Comments: No Neck stiffness or neck rigidity.  No meningismus.  No cervical  lymphadenopathy. Cardiovascular:     Comments: No murmurs rubs or gallops. Pulmonary:     Comments: Clear to auscultation bilaterally without wheeze, rhonchi or rales.  No accessory muscle usage.  Able speak in full sentences. Abdominal:     Comments: Soft, nontender without rebound or guarding.  No CVA tenderness.  Musculoskeletal:     Cervical back: Full passive range of motion without pain and normal range of motion.     Comments: Moves all 4 extremities without difficulty.  Lower extremities without edema, erythema or warmth.  Skin:    Comments: Brisk capillary refill.  No rashes or lesions.  Neurological:     Mental Status: She is alert.     Comments: Ambulatory in department without difficulty.  Cranial nerves II through XII grossly intact.  No facial droop.  No aphasia.     ED Results / Procedures / Treatments   Labs (all labs ordered are listed, but only abnormal results are displayed) Labs Reviewed - No data to display  EKG None  Radiology No results found.  Procedures Procedures   Medications Ordered in ED Medications - No data to display  ED Course  I have reviewed the triage vital signs and the nursing notes.  Pertinent labs & imaging results that were available during my care of the patient were reviewed by me and considered in my medical decision making (see chart for details).  Patient here for evaluation of upper respiratory complaints.  She is afebrile, nonseptic, not ill-appearing.  Has headache however nonfocal neuro exam without deficits.  No recent trauma, sudden onset thunderclap headache.  No neck stiffness or neck rigidity.  She has no meningismus.  Low suspicion for meningitis.  Ears without evidence of otitis.  Clear rhinorrhea to bilateral nares.  Posterior oropharynx clear.  No evidence of PTA or RPA.  Tolerating p.o. intake at home her uvula is midline.  Her heart and lungs are clear.  No chest pain, shortness of breath.  No clinical evidence of  DVT on exam.  She is without tachycardia, tachypnea or hypoxia her abdomen is soft, nontender.  She is ambulatory without difficulty.  Patient has 2 children who tested positive for flu.  Patient is likely influenza positive.  She would like treatment with Tamiflu given her symptoms started yesterday.  Discussed risk versus benefit.  She voiced understanding with to like Tamiflu.  Will DC home with symptomatic management.  She will return for any worsening symptoms  The patient has been appropriately medically screened and/or stabilized in the ED. I have low suspicion for any other emergent medical condition which would require further screening, evaluation or treatment in the ED or require inpatient management.  Patient is hemodynamically stable and in no acute distress.  Patient able to ambulate in department prior to ED.  Evaluation does not show acute pathology that would require ongoing or additional emergent interventions while in the emergency department or further inpatient treatment.  I have discussed the diagnosis with the patient and answered all questions.  Pain is been managed while in the emergency department and patient has no further complaints prior to discharge.  Patient is comfortable with plan discussed in room and is stable for discharge at this time.  I have discussed strict return precautions for returning to the emergency department.  Patient was encouraged to follow-up with PCP/specialist refer to at discharge.    MDM Rules/Calculators/A&P                          JAXYN MESTAS was evaluated in Emergency Department on 01/31/2021 for the symptoms described in the history of present illness. She was evaluated in the context of the global COVID-19 pandemic, which necessitated consideration that the patient might be at risk for infection with the SARS-CoV-2 virus that causes COVID-19. Institutional protocols and algorithms that pertain to the evaluation of patients at risk for  COVID-19 are in a state of rapid change based on information released by regulatory bodies including the CDC and federal and state organizations. These policies and algorithms were followed  during the patient's care in the ED. Final Clinical Impression(s) / ED Diagnoses Final diagnoses:  Upper respiratory tract infection, unspecified type  Exposure to influenza    Rx / DC Orders ED Discharge Orders         Ordered    oseltamivir (TAMIFLU) 75 MG capsule  Every 12 hours        01/31/21 1811    benzonatate (TESSALON) 100 MG capsule  Every 8 hours        01/31/21 1811    naproxen (NAPROSYN) 500 MG tablet  2 times daily        01/31/21 1811           Marielouise Amey A, PA-C 01/31/21 1818    Little, Ambrose Finland, MD 01/31/21 1950

## 2021-01-31 NOTE — ED Triage Notes (Signed)
Pt reports flu like symtoms, very congested cough, sore throat, headache, 2 sons at home teste positive for Influenza Friday

## 2021-01-31 NOTE — ED Notes (Signed)
Sore throat cough congestion and headache with earache per PT. ALL STARTED YESTERDAY AND HAS CHILDREN AT HOME WITH THE FLU.

## 2021-01-31 NOTE — ED Notes (Signed)
Pt. Reports she has 2 children at home that tested positive with flu on Friday.  Pt. Reports she is here due to headache and flu like symptoms.

## 2021-04-28 ENCOUNTER — Inpatient Hospital Stay (HOSPITAL_COMMUNITY): Payer: 59

## 2021-04-28 ENCOUNTER — Other Ambulatory Visit: Payer: Self-pay

## 2021-04-28 ENCOUNTER — Observation Stay (HOSPITAL_COMMUNITY)
Admission: AD | Admit: 2021-04-28 | Discharge: 2021-04-29 | Disposition: A | Discharge status: Home / Self Care | Payer: 59 | Attending: Obstetrics & Gynecology | Admitting: Obstetrics & Gynecology

## 2021-04-28 ENCOUNTER — Encounter (HOSPITAL_COMMUNITY): Payer: Self-pay | Admitting: Obstetrics and Gynecology

## 2021-04-28 DIAGNOSIS — Z20822 Contact with and (suspected) exposure to covid-19: Secondary | ICD-10-CM | POA: Insufficient documentation

## 2021-04-28 DIAGNOSIS — D649 Anemia, unspecified: Secondary | ICD-10-CM

## 2021-04-28 DIAGNOSIS — R58 Hemorrhage, not elsewhere classified: Secondary | ICD-10-CM

## 2021-04-28 DIAGNOSIS — O209 Hemorrhage in early pregnancy, unspecified: Secondary | ICD-10-CM

## 2021-04-28 DIAGNOSIS — O034 Incomplete spontaneous abortion without complication: Secondary | ICD-10-CM | POA: Diagnosis present

## 2021-04-28 DIAGNOSIS — O036 Delayed or excessive hemorrhage following complete or unspecified spontaneous abortion: Principal | ICD-10-CM | POA: Insufficient documentation

## 2021-04-28 DIAGNOSIS — Z79899 Other long term (current) drug therapy: Secondary | ICD-10-CM | POA: Insufficient documentation

## 2021-04-28 DIAGNOSIS — Z6791 Unspecified blood type, Rh negative: Secondary | ICD-10-CM

## 2021-04-28 LAB — CBC
HCT: 20.8 % — ABNORMAL LOW (ref 36.0–46.0)
Hemoglobin: 6.5 g/dL — CL (ref 12.0–15.0)
MCH: 24.4 pg — ABNORMAL LOW (ref 26.0–34.0)
MCHC: 31.3 g/dL (ref 30.0–36.0)
MCV: 78.2 fL — ABNORMAL LOW (ref 80.0–100.0)
Platelets: 320 10*3/uL (ref 150–400)
RBC: 2.66 MIL/uL — ABNORMAL LOW (ref 3.87–5.11)
RDW: 15.4 % (ref 11.5–15.5)
WBC: 9.5 10*3/uL (ref 4.0–10.5)
nRBC: 0 % (ref 0.0–0.2)

## 2021-04-28 LAB — PREPARE RBC (CROSSMATCH)

## 2021-04-28 LAB — HCG, QUANTITATIVE, PREGNANCY: hCG, Beta Chain, Quant, S: 7064 m[IU]/mL — ABNORMAL HIGH (ref <5)

## 2021-04-28 MED ORDER — SODIUM CHLORIDE 0.9 % IV BOLUS
1000.0000 mL | Freq: Once | INTRAVENOUS | Status: AC
  Administered 2021-04-28: 1000 mL via INTRAVENOUS

## 2021-04-28 MED ORDER — RHO D IMMUNE GLOBULIN 1500 UNIT/2ML IJ SOSY
300.0000 ug | PREFILLED_SYRINGE | Freq: Once | INTRAMUSCULAR | Status: DC
  Filled 2021-04-28: qty 2

## 2021-04-28 MED ORDER — LACTATED RINGERS IV BOLUS: 1000.0000 mL | Freq: Once | INTRAVENOUS | Status: DC

## 2021-04-28 MED ORDER — ACETAMINOPHEN 500 MG PO TABS
1000.0000 mg | ORAL_TABLET | ORAL | Status: AC
  Administered 2021-04-28: 1000 mg via ORAL
  Filled 2021-04-28: qty 2

## 2021-04-28 MED ORDER — SODIUM CHLORIDE 0.9% IV SOLUTION: Freq: Once | INTRAVENOUS | Status: DC

## 2021-04-28 NOTE — MAU Note (Signed)
Pt reports she is having a miscarriage. [facility].   Pt reports for the last 3 days she has had fowl smelling vaginal bleeding.   Pt reports yesterday that she had a big gush of blood clots while she was at work.   Pt reports she is feeling out of breath because she thinks she had lost too much blood.    Pt reports the miscarriage started on July 9th.   Pt reports that she had an ultrasound today and was told that they still see tissue. Pt reports she was told might have an infection and may need an antibiotic.

## 2021-04-28 NOTE — MAU Note (Signed)
CRITICAL VALUE STICKER  CRITICAL VALUE: Hemoglobin 6.5  RECEIVER (on-site recipient of call): Zenia Resides, RN   DATE & TIME NOTIFIED: 04/28/21 2020  MESSENGER (representative from lab):Carole Barrett   CNM NOTIFIED: Sharen Counter, CNM   TIME OF NOTIFICATION: 04/28/21 2023  RESPONSE:  No new orders. Waiting for IV team to arrive and establish IV access.

## 2021-04-28 NOTE — H&P (Addendum)
Morgan Quinn is an 35 y.o. female. [id number] here for bleeding after miscarriage.  Reports she was approximately 7w when she started having a miscarriage on 7/9, she was seen at [facility] and given rhogam at that time. She reports she has not taken any medications or had any procedures for the miscarriage. She reports she had heavy bleeding and then it slowed down so she had assumed the miscarriage was complete. She started having increased bleeding yesterday, including large clots. She also noticed an odor. [facility] [place] [facility] due to dizziness, large clots, found to have a low hemoglobin and was sent to [facility], where a low hemoglobin was confirmed, 6.5. Beta HCG 7859. She was told there was a long wait so she left and presented to the MAU. She reports headache, dizziness, shortness of breath when she is talking and palpitations especially when standing. Reports some cramps   Past Medical History:  Diagnosis Date   Anxiety    Cervical dysplasia    Depression    pp for 3 mos; was on medication   Genital warts    h/o cryo    Menorrhagia    Panic attacks    Vaginal Pap smear, abnormal     Past Surgical History:  Procedure Laterality Date   CERVICAL BIOPSY  W/ LOOP ELECTRODE EXCISION     DENTAL SURGERY  July 2014, September 2013   wisdom tooth extraction; other oral surgery    Family History  Problem Relation Age of Onset   Colon cancer Father 11       died at age 76    Prader-Willi syndrome Sister    Diabetes Paternal Grandmother     Social History:  reports that she has never smoked. She has never used smokeless tobacco. She reports current alcohol use. She reports that she does not use drugs.  Allergies: No Known Allergies  Medications Prior to Admission  Medication Sig Dispense Refill Last Dose   benzonatate (TESSALON) 100 MG capsule Take 1 capsule (100 mg total) by mouth every 8 (eight) hours. 21 capsule 0    calcium  carbonate (TUMS - DOSED IN MG ELEMENTAL CALCIUM) 500 MG chewable tablet Chew 1-2 tablets by mouth as needed for indigestion or heartburn.      ferrous sulfate 325 (65 FE) MG tablet Take 1 tablet (325 mg total) by mouth daily. 30 tablet 11    ibuprofen (ADVIL) 600 MG tablet Take 1 tablet (600 mg total) by mouth every 6 (six) hours. 30 tablet 1    naproxen (NAPROSYN) 500 MG tablet Take 1 tablet (500 mg total) by mouth 2 (two) times daily. 30 tablet 0    oseltamivir (TAMIFLU) 75 MG capsule Take 1 capsule (75 mg total) by mouth every 12 (twelve) hours. 10 capsule 0    oxyCODONE-acetaminophen (PERCOCET) 7.5-325 MG tablet Take 1-2 tablets by mouth every 6 (six) hours as needed for moderate pain. 10 tablet 0    Prenatal Vit-Fe Fumarate-FA (MULTIVITAMIN-PRENATAL) 27-0.8 MG TABS tablet Take 1 tablet by mouth daily at 12 noon.      senna-docusate (SENOKOT-S) 8.6-50 MG tablet Take 2 tablets by mouth daily. For constipation 30 tablet 11    sertraline (ZOLOFT) 50 MG tablet Take 50 mg by mouth daily. Patient stopped taking 2 weeks ago.       Review of Systems - per HPI all other pertinent ROS negative  Blood pressure 123/66, pulse (!) 106, temperature 98.6 F (37 C), resp. rate  12, SpO2 100 %, unknown if currently breastfeeding. Physical Exam Resting comfortably in stretcher Regular rhythm, tachycardic Lungs clear to auscultation bilaterally Pelvic: pad over 3 hours with minimal bleeding. On speculum exam approximately 10cc blood in the vault with bleeding from the cervical os.    Results for orders placed or performed during the hospital encounter of 04/28/21 (from the past 24 hour(s))  CBC     Status: Abnormal   Collection Time: 04/28/21  7:55 PM  Result Value Ref Range   WBC 9.5 4.0 - 10.5 K/uL   RBC 2.66 (L) 3.87 - 5.11 MIL/uL   Hemoglobin 6.5 (LL) 12.0 - 15.0 g/dL   HCT 03.1 (L) 59.4 - 58.5 %   MCV 78.2 (L) 80.0 - 100.0 fL   MCH 24.4 (L) 26.0 - 34.0 pg   MCHC 31.3 30.0 - 36.0 g/dL   RDW 92.9  24.4 - 62.8 %   Platelets 320 150 - 400 K/uL   nRBC 0.0 0.0 - 0.2 %  hCG, quantitative, pregnancy     Status: Abnormal   Collection Time: 04/28/21  7:55 PM  Result Value Ref Range   hCG, Beta Chain, Quant, S 7,064 (H) <5 mIU/mL  Type and screen     Status: None (Preliminary result)   Collection Time: 04/28/21  7:55 PM  Result Value Ref Range   ABO/RH(D) O NEG    Antibody Screen POS    Sample Expiration 05/01/2021,2359    Antibody Identification PASSIVELY ACQUIRED ANTI-D    Unit Number [id number]    Blood Component Type RED CELLS,LR    Unit division 00    Status of Unit ALLOCATED    Transfusion Status OK TO TRANSFUSE    Crossmatch Result COMPATIBLE    Unit Number [id number]    Blood Component Type RED CELLS,LR    Unit division 00    Status of Unit ISSUED    Transfusion Status OK TO TRANSFUSE    Crossmatch Result COMPATIBLE   Prepare RBC (crossmatch)     Status: None   Collection Time: 04/28/21  9:31 PM  Result Value Ref Range   Order Confirmation      ORDER PROCESSED BY BLOOD BANK Performed at [facility], [address], [place], [place] [address]     [place] Comp Less 14 Wks  Result Date: 04/28/2021 CLINICAL DATA:  Vaginal bleeding.  Recent spontaneous abortion. EXAM: OBSTETRIC <14 WK ULTRASOUND TECHNIQUE: Transabdominal ultrasound was performed for evaluation of the gestation as well as the maternal uterus and adnexal regions. COMPARISON:  None. FINDINGS: Intrauterine gestational sac: No normal gestational sac seen Maternal uterus/adnexae: Heterogeneous hypoechoic substance is seen within the endometrial cavity with thickness measuring 2.6 cm. A few possible areas of internal blood flow are seen on color Doppler ultrasound, and these findings are suspicious for retained products of conception. No fibroids identified. A simple appearing cyst is seen in the right adnexa which measures 3.9 x 3.5 x 3.1 cm. No other adnexal mass identified. No evidence of free fluid.  IMPRESSION: Heterogeneous hypoechoic sub cysts within endometrial cavity, with possible small areas of internal blood flow. These findings are suspicious for retained products of conception. 3.9 cm simple appearing right ovarian cyst. No follow-up imaging recommended. Electronically Signed   By: Danae Orleans M.D.   On: 04/28/2021 20:50    Assessment/Plan: Morgan Quinn 35 y.o. [id number] here with retained products following a 7w miscarriage ABLA: Hgb 6.5, will transfuse 2u pRBC Retained products of conception: discussed  proceeding with D&C, patient agrees. Reviewed risks/benefits/alternatives. Patient signed consent. Plan for doxycyline 200mg  IV prior to surgery. OR notified. Patient NPO since 5:30am, given current exam, appropriate to wait 8h after she last ate to proceed with D&C. Plan for intraoperative guidance. Cenia Zaragosa K Taam-Akelman 04/28/2021, 10:17 PM

## 2021-04-28 NOTE — MAU Provider Note (Addendum)
History     CSN: [id number]  Arrival date and time: 04/28/21 1839   Event Date/Time   First Provider Initiated Contact with Patient 04/28/21 1911      Chief Complaint  Patient presents with   Vaginal Bleeding   HPI Morgan Quinn is a 35 y.o. G6P5005 at [redacted]w[redacted]d who presents for vaginal bleeding.  States she started the process of a miscarriage on 7/9. Had an episode of heavy bleeding on 7/9, then bleeding restarted yesterday. Reports heavy bleeding with large clots all day yesterday and this morning. Went to [facility] in [place] this morning due to her symptoms & was told to go to [facility] ER for evaluation & treatment. States they did an ultrasound & was told she had retained products.  Went to [facility] & had labs drawn but was told it would be an 18 hour wait so she came here.  Reports headache, dizziness, shortness of breath when she's talking, and palpitations that are worse with standing. She denies fever/chills. Reports foul smelling discharge that started yesterday.  Goes to [facility] ob - last seen there  03/24/2021 for a gyn visit.   OB History     Gravida  6   Para  5   Term  5   Preterm  0   AB  0   Living  5      SAB  0   IAB  0   Ectopic  0   Multiple  0   Live Births  5           Past Medical History:  Diagnosis Date   Anxiety    Cervical dysplasia    Depression    pp for 3 mos; was on medication   Genital warts    h/o cryo    Menorrhagia    Panic attacks    Vaginal Pap smear, abnormal     Past Surgical History:  Procedure Laterality Date   CERVICAL BIOPSY  W/ LOOP ELECTRODE EXCISION     DENTAL SURGERY  July 2014, September 2013   wisdom tooth extraction; other oral surgery    Family History  Problem Relation Age of Onset   Colon cancer Father 42       died at age 73    Prader-Willi syndrome Sister    Diabetes Paternal Grandmother     Social History   Tobacco Use   Smoking status: Never   Smokeless tobacco:  Never  Vaping Use   Vaping Use: Never used  Substance Use Topics   Alcohol use: Yes    Comment: social   Drug use: No    Allergies: No Known Allergies  Medications Prior to Admission  Medication Sig Dispense Refill Last Dose   benzonatate (TESSALON) 100 MG capsule Take 1 capsule (100 mg total) by mouth every 8 (eight) hours. 21 capsule 0    calcium carbonate (TUMS - DOSED IN MG ELEMENTAL CALCIUM) 500 MG chewable tablet Chew 1-2 tablets by mouth as needed for indigestion or heartburn.      ferrous sulfate 325 (65 FE) MG tablet Take 1 tablet (325 mg total) by mouth daily. 30 tablet 11    ibuprofen (ADVIL) 600 MG tablet Take 1 tablet (600 mg total) by mouth every 6 (six) hours. 30 tablet 1    naproxen (NAPROSYN) 500 MG tablet Take 1 tablet (500 mg total) by mouth 2 (two) times daily. 30 tablet 0    oseltamivir (TAMIFLU) 75 MG capsule Take 1  capsule (75 mg total) by mouth every 12 (twelve) hours. 10 capsule 0    oxyCODONE-acetaminophen (PERCOCET) 7.5-325 MG tablet Take 1-2 tablets by mouth every 6 (six) hours as needed for moderate pain. 10 tablet 0    Prenatal Vit-Fe Fumarate-FA (MULTIVITAMIN-PRENATAL) 27-0.8 MG TABS tablet Take 1 tablet by mouth daily at 12 noon.      senna-docusate (SENOKOT-S) 8.6-50 MG tablet Take 2 tablets by mouth daily. For constipation 30 tablet 11    sertraline (ZOLOFT) 50 MG tablet Take 50 mg by mouth daily. Patient stopped taking 2 weeks ago.       Review of Systems  Constitutional: Negative.   Respiratory:  Positive for shortness of breath.   Cardiovascular:  Positive for palpitations. Negative for chest pain.  Gastrointestinal: Negative.   Genitourinary:  Positive for vaginal bleeding.  Neurological:  Positive for dizziness, light-headedness and headaches. Negative for syncope.  Physical Exam   Blood pressure 118/80, pulse (!) 110, temperature 98.7 F (37.1 C), temperature source Oral, resp. rate 12, SpO2 100 %, unknown if currently  breastfeeding.  Physical Exam Vitals and nursing note reviewed.  Constitutional:      General: She is not in acute distress.    Appearance: Normal appearance. She is not ill-appearing or diaphoretic.  HENT:     Head: Normocephalic and atraumatic.  Eyes:     General: No scleral icterus. Cardiovascular:     Rate and Rhythm: Regular rhythm. Tachycardia present.  Pulmonary:     Effort: Pulmonary effort is normal. No respiratory distress.  Genitourinary:    Comments: Scant dark red blood on pad Neurological:     Mental Status: She is alert.  Psychiatric:        Mood and Affect: Mood normal.        Behavior: Behavior normal.    MAU Course  Procedures Results for orders placed or performed during the hospital encounter of 04/28/21 (from the past 24 hour(s))  CBC     Status: Abnormal   Collection Time: 04/28/21  7:55 PM  Result Value Ref Range   WBC 9.5 4.0 - 10.5 K/uL   RBC 2.66 (L) 3.87 - 5.11 MIL/uL   Hemoglobin 6.5 (LL) 12.0 - 15.0 g/dL   HCT 02.7 (L) 74.1 - 28.7 %   MCV 78.2 (L) 80.0 - 100.0 fL   MCH 24.4 (L) 26.0 - 34.0 pg   MCHC 31.3 30.0 - 36.0 g/dL   RDW 86.7 67.2 - 09.4 %   Platelets 320 150 - 400 K/uL   nRBC 0.0 0.0 - 0.2 %  hCG, quantitative, pregnancy     Status: Abnormal   Collection Time: 04/28/21  7:55 PM  Result Value Ref Range   hCG, Beta Chain, Quant, S 7,064 (H) <5 mIU/mL  Type and screen     Status: None (Preliminary result)   Collection Time: 04/28/21  7:55 PM  Result Value Ref Range   ABO/RH(D) O NEG    Antibody Screen POS    Sample Expiration 05/01/2021,2359    Antibody Identification PASSIVELY ACQUIRED ANTI-D    Unit Number [id number]    Blood Component Type RED CELLS,LR    Unit division 00    Status of Unit ALLOCATED    Transfusion Status OK TO TRANSFUSE    Crossmatch Result COMPATIBLE    Unit Number [id number]    Blood Component Type RED CELLS,LR    Unit division 00    Status of Unit ISSUED    Transfusion Status OK TO TRANSFUSE  Crossmatch Result COMPATIBLE   Prepare RBC (crossmatch)     Status: None   Collection Time: 04/28/21  9:31 PM  Result Value Ref Range   Order Confirmation      ORDER PROCESSED BY BLOOD BANK Performed at [facility], [address] [place], [place] [address]    [place] Comp Less 14 Wks  Result Date: 04/28/2021 CLINICAL DATA:  Vaginal bleeding.  Recent spontaneous abortion. EXAM: OBSTETRIC <14 WK ULTRASOUND TECHNIQUE: Transabdominal ultrasound was performed for evaluation of the gestation as well as the maternal uterus and adnexal regions. COMPARISON:  None. FINDINGS: Intrauterine gestational sac: No normal gestational sac seen Maternal uterus/adnexae: Heterogeneous hypoechoic substance is seen within the endometrial cavity with thickness measuring 2.6 cm. A few possible areas of internal blood flow are seen on color Doppler ultrasound, and these findings are suspicious for retained products of conception. No fibroids identified. A simple appearing cyst is seen in the right adnexa which measures 3.9 x 3.5 x 3.1 cm. No other adnexal mass identified. No evidence of free fluid. IMPRESSION: Heterogeneous hypoechoic sub cysts within endometrial cavity, with possible small areas of internal blood flow. These findings are suspicious for retained products of conception. 3.9 cm simple appearing right ovarian cyst. No follow-up imaging recommended. Electronically Signed   By: Danae Orleans M.D.   On: 04/28/2021 20:50    MDM Patient presents for miscarriage. She is tachycardic. Hemoglobin at [facility] earlier today was 6.5, but she left AMA prior to being evaluated or treated. Will get CBC, HCG, and ultrasound. IV bolus ordered.  She is RH negative. She is adamant that she received rhogam at [facility] on 7/8.  Care turned over to [facility] CNM Judeth Horn, NP 04/28/2021 8:09 PM   Results for orders placed or performed during the hospital encounter of 04/28/21 (from the past 24 hour(s))   CBC     Status: Abnormal   Collection Time: 04/28/21  7:55 PM  Result Value Ref Range   WBC 9.5 4.0 - 10.5 K/uL   RBC 2.66 (L) 3.87 - 5.11 MIL/uL   Hemoglobin 6.5 (LL) 12.0 - 15.0 g/dL   HCT 26.9 (L) 48.5 - 46.2 %   MCV 78.2 (L) 80.0 - 100.0 fL   MCH 24.4 (L) 26.0 - 34.0 pg   MCHC 31.3 30.0 - 36.0 g/dL   RDW 70.3 50.0 - 93.8 %   Platelets 320 150 - 400 K/uL   nRBC 0.0 0.0 - 0.2 %  hCG, quantitative, pregnancy     Status: Abnormal   Collection Time: 04/28/21  7:55 PM  Result Value Ref Range   hCG, Beta Chain, Quant, S 7,064 (H) <5 mIU/mL  Type and screen     Status: None (Preliminary result)   Collection Time: 04/28/21  7:55 PM  Result Value Ref Range   ABO/RH(D) O NEG    Antibody Screen POS    Sample Expiration 05/01/2021,2359    Antibody Identification PASSIVELY ACQUIRED ANTI-D    Unit Number [id number]    Blood Component Type RED CELLS,LR    Unit division 00    Status of Unit ALLOCATED    Transfusion Status OK TO TRANSFUSE    Crossmatch Result COMPATIBLE    Unit Number [id number]    Blood Component Type RED CELLS,LR    Unit division 00    Status of Unit ISSUED    Transfusion Status OK TO TRANSFUSE    Crossmatch Result COMPATIBLE   Prepare RBC (crossmatch)  Status: None   Collection Time: 04/28/21  9:31 PM  Result Value Ref Range   Order Confirmation      ORDER PROCESSED BY BLOOD BANK Performed at [facility], [address] [place], [place] [id number]     Imaging:  [place] Comp Less 14 Wks  Result Date: 04/28/2021 CLINICAL DATA:  Vaginal bleeding.  Recent spontaneous abortion. EXAM: OBSTETRIC <14 WK ULTRASOUND TECHNIQUE: Transabdominal ultrasound was performed for evaluation of the gestation as well as the maternal uterus and adnexal regions. COMPARISON:  None. FINDINGS: Intrauterine gestational sac: No normal gestational sac seen Maternal uterus/adnexae: Heterogeneous hypoechoic substance is seen within the endometrial cavity with thickness measuring  2.6 cm. A few possible areas of internal blood flow are seen on color Doppler ultrasound, and these findings are suspicious for retained products of conception. No fibroids identified. A simple appearing cyst is seen in the right adnexa which measures 3.9 x 3.5 x 3.1 cm. No other adnexal mass identified. No evidence of free fluid. IMPRESSION: Heterogeneous hypoechoic sub cysts within endometrial cavity, with possible small areas of internal blood flow. These findings are suspicious for retained products of conception. 3.9 cm simple appearing right ovarian cyst. No follow-up imaging recommended. Electronically Signed   By: Danae OrleansJohn A Stahl M.D.   On: 04/28/2021 20:50     Assessment and Plan   1. Symptomatic anemia   2. Vaginal bleeding in pregnancy, first trimester   3. Bleeding   4. Retained products of conception after miscarriage   5. Blood type, Rh negative      Consult Dr Claudine Moutonaam with US and lab findings.  Two units PRBCs ordered. Pt admitted, prepped for OR for D&C.   Sharen CounterLisa Leftwich-Kirby, CNM 11:03 PM

## 2021-04-28 NOTE — MAU Note (Signed)
RN called Dr. Claudine Mouton because patient would like tylenol for headache. Dr. Claudine Mouton told RN to release pre-op med for tylenol and give now

## 2021-04-29 ENCOUNTER — Observation Stay (HOSPITAL_COMMUNITY): Payer: 59 | Admitting: Anesthesiology

## 2021-04-29 ENCOUNTER — Encounter (HOSPITAL_COMMUNITY): Payer: Self-pay | Admitting: Obstetrics & Gynecology

## 2021-04-29 ENCOUNTER — Encounter (HOSPITAL_COMMUNITY)
Admission: AD | Disposition: A | Discharge status: Home / Self Care | Payer: Self-pay | Source: Home / Self Care | Attending: Obstetrics & Gynecology

## 2021-04-29 ENCOUNTER — Encounter: Payer: Self-pay | Admitting: Hematology and Oncology

## 2021-04-29 ENCOUNTER — Observation Stay (HOSPITAL_COMMUNITY): Payer: 59

## 2021-04-29 DIAGNOSIS — O036 Delayed or excessive hemorrhage following complete or unspecified spontaneous abortion: Secondary | ICD-10-CM | POA: Diagnosis not present

## 2021-04-29 DIAGNOSIS — O034 Incomplete spontaneous abortion without complication: Secondary | ICD-10-CM | POA: Diagnosis present

## 2021-04-29 HISTORY — PX: DILATION AND CURETTAGE OF UTERUS: SHX78

## 2021-04-29 LAB — POCT I-STAT 7, (LYTES, BLD GAS, ICA,H+H)
Acid-base deficit: 2 mmol/L (ref 0.0–2.0)
Bicarbonate: 23.1 mmol/L (ref 20.0–28.0)
Calcium, Ion: 1.03 mmol/L — ABNORMAL LOW (ref 1.15–1.40)
HCT: 20 % — ABNORMAL LOW (ref 36.0–46.0)
Hemoglobin: 6.8 g/dL — CL (ref 12.0–15.0)
O2 Saturation: 100 %
Potassium: 4 mmol/L (ref 3.5–5.1)
Sodium: 138 mmol/L (ref 135–145)
TCO2: 24 mmol/L (ref 22–32)
pCO2 arterial: 38.3 mmHg (ref 32.0–48.0)
pH, Arterial: 7.388 (ref 7.350–7.450)
pO2, Arterial: 309 mmHg — ABNORMAL HIGH (ref 83.0–108.0)

## 2021-04-29 LAB — CBC WITH DIFFERENTIAL/PLATELET
Abs Immature Granulocytes: 0.02 10*3/uL (ref 0.00–0.07)
Basophils Absolute: 0 10*3/uL (ref 0.0–0.1)
Basophils Relative: 0 %
Eosinophils Absolute: 0.1 10*3/uL (ref 0.0–0.5)
Eosinophils Relative: 1 %
HCT: 23.6 % — ABNORMAL LOW (ref 36.0–46.0)
Hemoglobin: 7.6 g/dL — ABNORMAL LOW (ref 12.0–15.0)
Immature Granulocytes: 0 %
Lymphocytes Relative: 30 %
Lymphs Abs: 1.9 10*3/uL (ref 0.7–4.0)
MCH: 26 pg (ref 26.0–34.0)
MCHC: 32.2 g/dL (ref 30.0–36.0)
MCV: 80.8 fL (ref 80.0–100.0)
Monocytes Absolute: 0.5 10*3/uL (ref 0.1–1.0)
Monocytes Relative: 8 %
Neutro Abs: 3.7 10*3/uL (ref 1.7–7.7)
Neutrophils Relative %: 61 %
Platelets: 248 10*3/uL (ref 150–400)
RBC: 2.92 MIL/uL — ABNORMAL LOW (ref 3.87–5.11)
RDW: 14.7 % (ref 11.5–15.5)
WBC: 6.2 10*3/uL (ref 4.0–10.5)
nRBC: 0 % (ref 0.0–0.2)

## 2021-04-29 LAB — RESP PANEL BY RT-PCR (FLU A&B, COVID) ARPGX2
Influenza A by PCR: NEGATIVE
Influenza B by PCR: NEGATIVE
SARS Coronavirus 2 by RT PCR: NEGATIVE

## 2021-04-29 SURGERY — DILATION AND CURETTAGE
Anesthesia: General

## 2021-04-29 MED ORDER — IBUPROFEN 800 MG PO TABS: 800.0000 mg | ORAL_TABLET | Freq: Three times a day (TID) | ORAL | 0 refills | Status: DC | PRN

## 2021-04-29 MED ORDER — GABAPENTIN 300 MG PO CAPS: 300.0000 mg | ORAL_CAPSULE | ORAL | Status: DC

## 2021-04-29 MED ORDER — ACETAMINOPHEN 10 MG/ML IV SOLN
1000.0000 mg | Freq: Once | INTRAVENOUS | Status: DC | PRN
  Administered 2021-04-29: 1000 mg via INTRAVENOUS

## 2021-04-29 MED ORDER — OXYCODONE HCL 5 MG PO TABS
5.0000 mg | ORAL_TABLET | Freq: Once | ORAL | Status: AC | PRN
  Administered 2021-04-29: 5 mg via ORAL

## 2021-04-29 MED ORDER — SUCCINYLCHOLINE CHLORIDE 20 MG/ML IJ SOLN
INTRAMUSCULAR | Status: DC | PRN
  Administered 2021-04-29: 90 mg via INTRAVENOUS

## 2021-04-29 MED ORDER — MIDAZOLAM HCL 2 MG/2ML IJ SOLN
INTRAMUSCULAR | Status: DC | PRN
  Administered 2021-04-29: 2 mg via INTRAVENOUS

## 2021-04-29 MED ORDER — SODIUM CHLORIDE 0.9 % IV SOLN: INTRAVENOUS | Status: DC | PRN

## 2021-04-29 MED ORDER — SCOPOLAMINE 1 MG/3DAYS TD PT72: 1.0000 | MEDICATED_PATCH | TRANSDERMAL | Status: DC

## 2021-04-29 MED ORDER — LIDOCAINE HCL 1 % IJ SOLN
INTRAMUSCULAR | Status: AC
  Filled 2021-04-29: qty 20

## 2021-04-29 MED ORDER — TRAMADOL HCL 50 MG PO TABS: 50.0000 mg | ORAL_TABLET | Freq: Four times a day (QID) | ORAL | Status: DC | PRN

## 2021-04-29 MED ORDER — DOXYCYCLINE HYCLATE 100 MG IV SOLR
200.0000 mg | INTRAVENOUS | Status: AC
  Administered 2021-04-29: 200 mg via INTRAVENOUS
  Filled 2021-04-29: qty 200

## 2021-04-29 MED ORDER — FENTANYL CITRATE (PF) 250 MCG/5ML IJ SOLN
INTRAMUSCULAR | Status: AC
  Filled 2021-04-29: qty 5

## 2021-04-29 MED ORDER — 0.9 % SODIUM CHLORIDE (POUR BTL) OPTIME
TOPICAL | Status: DC | PRN
  Administered 2021-04-29: 1000 mL

## 2021-04-29 MED ORDER — OXYCODONE HCL 5 MG/5ML PO SOLN: 5.0000 mg | Freq: Once | ORAL | Status: AC | PRN

## 2021-04-29 MED ORDER — PROPOFOL 10 MG/ML IV BOLUS
INTRAVENOUS | Status: DC | PRN
  Administered 2021-04-29: 200 mg via INTRAVENOUS

## 2021-04-29 MED ORDER — FENTANYL CITRATE (PF) 100 MCG/2ML IJ SOLN
INTRAMUSCULAR | Status: AC
  Filled 2021-04-29: qty 2

## 2021-04-29 MED ORDER — MIDAZOLAM HCL 2 MG/2ML IJ SOLN
INTRAMUSCULAR | Status: AC
  Filled 2021-04-29: qty 2

## 2021-04-29 MED ORDER — LIDOCAINE HCL (CARDIAC) PF 100 MG/5ML IV SOSY
PREFILLED_SYRINGE | INTRAVENOUS | Status: DC | PRN
  Administered 2021-04-29: 60 mg via INTRATRACHEAL

## 2021-04-29 MED ORDER — LACTATED RINGERS IV SOLN: INTRAVENOUS | Status: DC | PRN

## 2021-04-29 MED ORDER — AMISULPRIDE (ANTIEMETIC) 5 MG/2ML IV SOLN: 10.0000 mg | Freq: Once | INTRAVENOUS | Status: DC | PRN

## 2021-04-29 MED ORDER — POVIDONE-IODINE 10 % EX SWAB: 2.0000 "application " | Freq: Once | CUTANEOUS | Status: DC

## 2021-04-29 MED ORDER — FENTANYL CITRATE (PF) 100 MCG/2ML IJ SOLN
25.0000 ug | INTRAMUSCULAR | Status: DC | PRN
  Administered 2021-04-29 (×2): 50 ug via INTRAVENOUS

## 2021-04-29 MED ORDER — PROMETHAZINE HCL 25 MG/ML IJ SOLN: 6.2500 mg | INTRAMUSCULAR | Status: DC | PRN

## 2021-04-29 MED ORDER — IBUPROFEN 600 MG PO TABS: 600.0000 mg | ORAL_TABLET | Freq: Four times a day (QID) | ORAL | Status: DC | PRN

## 2021-04-29 MED ORDER — OXYCODONE HCL 5 MG PO TABS
ORAL_TABLET | ORAL | Status: AC
  Filled 2021-04-29: qty 1

## 2021-04-29 MED ORDER — KETOROLAC TROMETHAMINE 30 MG/ML IJ SOLN
INTRAMUSCULAR | Status: DC | PRN
  Administered 2021-04-29: 30 mg via INTRAVENOUS

## 2021-04-29 MED ORDER — FENTANYL CITRATE (PF) 250 MCG/5ML IJ SOLN
INTRAMUSCULAR | Status: DC | PRN
  Administered 2021-04-29: 150 ug via INTRAVENOUS

## 2021-04-29 MED ORDER — LACTATED RINGERS IV SOLN: INTRAVENOUS | Status: DC

## 2021-04-29 MED ORDER — HYDROMORPHONE HCL 1 MG/ML IJ SOLN: 0.5000 mg | INTRAMUSCULAR | Status: DC | PRN

## 2021-04-29 MED ORDER — PROPOFOL 10 MG/ML IV BOLUS
INTRAVENOUS | Status: AC
  Filled 2021-04-29: qty 20

## 2021-04-29 MED ORDER — ACETAMINOPHEN 10 MG/ML IV SOLN
INTRAVENOUS | Status: AC
  Filled 2021-04-29: qty 100

## 2021-04-29 MED ORDER — LIDOCAINE HCL 1 % IJ SOLN
INTRAMUSCULAR | Status: DC | PRN
  Administered 2021-04-29: 10 mL

## 2021-04-29 SURGICAL SUPPLY — 25 items
CATH ROBINSON RED A/P 16FR (CATHETERS) ×4 IMPLANT
CNTNR URN SCR LID CUP LEK RST (MISCELLANEOUS) ×1 IMPLANT
CONT SPEC 4OZ STRL OR WHT (MISCELLANEOUS) ×2
DECANTER SPIKE VIAL GLASS SM (MISCELLANEOUS) ×2 IMPLANT
DILATOR CANAL MILEX (MISCELLANEOUS) IMPLANT
FILTER UTR ASPR ASSEMBLY (MISCELLANEOUS) ×2 IMPLANT
GLOVE SURG UNDER POLY LF SZ6 (GLOVE) ×4 IMPLANT
GLOVE SURG UNDER POLY LF SZ7 (GLOVE) ×4 IMPLANT
GOWN STRL REUS W/ TWL LRG LVL3 (GOWN DISPOSABLE) ×4 IMPLANT
GOWN STRL REUS W/TWL LRG LVL3 (GOWN DISPOSABLE) ×8
HOSE CONNECTING 18IN BERKELEY (TUBING) ×2 IMPLANT
KIT BERKELEY 1ST TRI 3/8 NO TR (MISCELLANEOUS) ×2 IMPLANT
KIT BERKELEY 1ST TRIMESTER 3/8 (MISCELLANEOUS) ×2 IMPLANT
KIT TURNOVER KIT B (KITS) ×2 IMPLANT
NS IRRIG 1000ML POUR BTL (IV SOLUTION) ×2 IMPLANT
PACK VAGINAL MINOR WOMEN LF (CUSTOM PROCEDURE TRAY) ×4 IMPLANT
PAD OB MATERNITY 4.3X12.25 (PERSONAL CARE ITEMS) ×4 IMPLANT
SET BERKELEY SUCTION TUBING (SUCTIONS) ×2 IMPLANT
SPECIMEN JAR MEDIUM (MISCELLANEOUS) ×1 IMPLANT
TOWEL GREEN STERILE FF (TOWEL DISPOSABLE) ×8 IMPLANT
UNDERPAD 30X36 HEAVY ABSORB (UNDERPADS AND DIAPERS) ×4 IMPLANT
VACURETTE 10 RIGID CVD (CANNULA) IMPLANT
VACURETTE 7MM CVD STRL WRAP (CANNULA) IMPLANT
VACURETTE 8 RIGID CVD (CANNULA) ×1 IMPLANT
VACURETTE 9 RIGID CVD (CANNULA) IMPLANT

## 2021-04-29 NOTE — Discharge Summary (Signed)
Physician Discharge Summary  Patient ID: Morgan Quinn MRN: [id number] DOB/AGE: [birth year] 35 y.o.  Admit date: 04/28/2021 Discharge date: 04/29/2021  Admission Diagnoses: symptomatic anemia, miscarriage  Discharge Diagnoses:  Active Problems:   Symptomatic anemia   Retained products of conception after miscarriage   Discharged Condition: good  Hospital Course: Morgan Quinn presented to MAU for concern for acute blood loss anemia and miscarriage. In MAU she had a Hgb of 6.5, was given 2u pRBC. [place] concerning for retained products of conception. Due to on going bleeding, anemia and [place] with concern for retained products of conception patient was taken to the operating room for D&E. In pre-op holding where her consent was reviewed and all questions were answered. She was taken to the operating room by attending surgeon Dr. Pollie Meyer. She underwent an uncomplicated D&E. Please see operative dictation for further details. Following her surgery she was taken to the PACU for recovery and then transferred to extended recovery. On the morning of POD#0 she was tolerating a regular diet, her pain was controlled on po pain medications, she was ambulating without assistance and voiding spontaneously. She was meeting all goals and deemed stable for discharge.   Significant Diagnostic Studies:  Recent Labs    04/28/21 1955 04/29/21 0346 04/29/21 0609  WBC 9.5  --  6.2  HGB 6.5* 6.8* 7.6*  HCT 20.8* 20.0* 23.6*  PLT 320  --  248  NA  --  138  --   K  --  4.0  --     Consults: None    Treatments: 2u pRBC and D&E  Discharge Exam: Blood pressure (!) 107/55, pulse 86, temperature 98.2 F (36.8 C), temperature source Oral, resp. rate 16, height 5\' 3"  (1.6 m), weight 90.7 kg, SpO2 99 %, unknown if currently breastfeeding. Alert and no distress Abdomen soft Minimal bleeding  Disposition: Discharge disposition: 01-Home or Self Care       Discharge Instructions     Call MD for:   difficulty breathing, headache or visual disturbances   Complete by: As directed    Call MD for:  persistant nausea and vomiting   Complete by: As directed    Call MD for:  redness, tenderness, or signs of infection (pain, swelling, redness, odor or green/yellow discharge around incision site)   Complete by: As directed    Call MD for:  severe uncontrolled pain   Complete by: As directed    Call MD for:  temperature >100.4   Complete by: As directed    Diet general   Complete by: As directed    Increase activity slowly   Complete by: As directed    Sexual Activity Restrictions   Complete by: As directed    Nothing in the vagina for 2 weeks      Allergies as of 04/29/2021   No Known Allergies      Medication List     STOP taking these medications    benzonatate 100 MG capsule Commonly known as: TESSALON   calcium carbonate 500 MG chewable tablet Commonly known as: TUMS - dosed in mg elemental calcium   ferrous sulfate 325 (65 FE) MG tablet   multivitamin-prenatal 27-0.8 MG Tabs tablet   naproxen 500 MG tablet Commonly known as: NAPROSYN   oseltamivir 75 MG capsule Commonly known as: TAMIFLU   oxyCODONE-acetaminophen 7.5-325 MG tablet Commonly known as: PERCOCET   senna-docusate 8.6-50 MG tablet Commonly known as: Senokot-S   sertraline 50 MG tablet Commonly known as:  ZOLOFT       TAKE these medications    ibuprofen 800 MG tablet Commonly known as: ADVIL Take 1 tablet (800 mg total) by mouth every 8 (eight) hours as needed. What changed:  medication strength how much to take when to take this reasons to take this        Follow-up Information     Taam-Akelman, Griselda Miner, MD. Schedule an appointment as soon as possible for a visit in 2 week(s).   Specialty: Obstetrics and Gynecology Contact information: [address] [place] [address] [place] [address] [phone]                 Signed: Rande Brunt 04/29/2021, 4:00  AM

## 2021-04-29 NOTE — Anesthesia Preprocedure Evaluation (Addendum)
Anesthesia Evaluation  Patient identified by MRN, date of birth, ID band Patient awake    Reviewed: Allergy & Precautions, NPO status , Patient's Chart, lab work & pertinent test results  Airway Mallampati: II       Dental  (+) Dental Advisory Given   Pulmonary    Pulmonary exam normal        Cardiovascular Normal cardiovascular exam Rhythm:Regular Rate:Normal     Neuro/Psych PSYCHIATRIC DISORDERS Anxiety Depression    GI/Hepatic   Endo/Other    Renal/GU      Musculoskeletal   Abdominal   Peds  Hematology  (+) anemia ,   Anesthesia Other Findings retained products of conception  Reproductive/Obstetrics                            Anesthesia Physical Anesthesia Plan  ASA: 2 and emergent  Anesthesia Plan: General   Post-op Pain Management:    Induction: Intravenous  PONV Risk Score and Plan: 4 or greater and Ondansetron, Dexamethasone, Midazolam and Treatment may vary due to age or medical condition  Airway Management Planned: Oral ETT  Additional Equipment:   Intra-op Plan:   Post-operative Plan: Extubation in OR  Informed Consent: I have reviewed the patients History and Physical, chart, labs and discussed the procedure including the risks, benefits and alternatives for the proposed anesthesia with the patient or authorized representative who has indicated his/her understanding and acceptance.     Dental advisory given  Plan Discussed with: Anesthesiologist, Surgeon and CRNA  Anesthesia Plan Comments:        Anesthesia Quick Evaluation

## 2021-04-29 NOTE — Anesthesia Procedure Notes (Signed)
Procedure Name: Intubation Date/Time: 04/29/2021 3:20 AM Performed by: Molli Hazard, CRNA Pre-anesthesia Checklist: Patient identified, Emergency Drugs available, Suction available and Patient being monitored Patient Re-evaluated:Patient Re-evaluated prior to induction Oxygen Delivery Method: Circle system utilized Preoxygenation: Pre-oxygenation with 100% oxygen Induction Type: IV induction and Rapid sequence Laryngoscope Size: Miller and 2 Grade View: Grade I Tube type: Oral Tube size: 7.5 mm Airway Equipment and Method: Stylet Placement Confirmation: ETT inserted through vocal cords under direct vision, positive ETCO2 and breath sounds checked- equal and bilateral Secured at: 22 cm Tube secured with: Tape Dental Injury: Teeth and Oropharynx as per pre-operative assessment

## 2021-04-29 NOTE — Plan of Care (Signed)
Patient to be discharged home with printed instructions. Kalliope Riesen L Seiji Wiswell, RN  

## 2021-04-29 NOTE — Discharge Instructions (Signed)
Medications Take Motrin (ibuprofen) 800mg  every 8 hours as needed for pain Take Tylenol (acetaminophen) 650mg  every 6 hour as needed for pain

## 2021-04-29 NOTE — Progress Notes (Signed)
Patient prepared for preop. CHG bath and betadine nasal swob. Patient transferred to short stay via RN and Tech. Report given to receiving RN. Raelyn Ensign, RN

## 2021-04-29 NOTE — Progress Notes (Signed)
Dr Claudine Mouton notified of iSTAT hgb result 6.8 after 2nd unit of RBC. Verbal order received to recheck cbc in 1hr. Will pass on in report to receiving RN on unit.

## 2021-04-29 NOTE — Op Note (Signed)
OPERATIVE NOTE 04/28/2021 - 04/29/2021  3:48 AM  PATIENT:  Morgan Quinn  35 y.o. female  PRE-OPERATIVE DIAGNOSIS:  retained products  POST-OPERATIVE DIAGNOSIS:  retained products  PROCEDURE:  Procedure(s): DILATATION AND EVACUATION UNDER ULTRASOUND GUIDANCE (N/A)  SURGEON:  Surgeon(s) and Role:    * Taam-Akelman, Griselda Miner, MD - Primary  ASSISTANTS: none   ANESTHESIA: General and  local  EBL:  10cc  BLOOD ADMINISTERED: Patient had been given 2u pRBC prior to the OR. Plan to obtain a Istat Hemoglobin post procedure.   ANTIBIOTICS: 200mg  doxycyline  DRAINS: none   LOCAL MEDICATIONS USED:  10cc 1% LIDOCAINE   SPECIMEN:  Source of Specimen:  retained products of conception  DISPOSITION OF SPECIMEN:  PATHOLOGY  COUNTS:  YES  TOURNIQUET:  * No tourniquets in log *  PLAN OF CARE: Discharge to home after PACU  PATIENT DISPOSITION:  PACU - hemodynamically stable.   Delay start of Pharmacological VTE agent (>24hrs) due to surgical blood loss or risk of bleeding: not applicable  FINDINGS: Exam under anesthesia revealed 8 week uterus. On transabdominal thickened endometrial stripe with heterogeneous cysts within the endometrial cavity. Suction curette used to evacuate the uterus of clot and products of conception. Post procedure transabdominal US showed thin endometrial stripe.   DESCRIPTION OF PROCEDURE: After consent was verified, the patient was taken to the operating room where general anesthesia was administered without difficulty.  The patient was placed in the dorsal lithotomy position using allen stirups. An exam under anesthesia revealed a 8 week size uterus. The vagina was then prepped and draped in a normal sterile fashion. An in and out catheterization was performed.   A bivalve speculum was placed in the vagina. The anterior lip of the cervix was grasped with a single tooth tenaculum. 10cc 1% lidocaine injected at 4 and 8 o'clock for a paracervical block. The  cervix was already dilated. A 38mm suction curette was inserted to the fundus under 4m guidance and was used to evacuate the uterus of clot and products of conception with 3 passes. Post procedure transabdominal US showed thin endometrial stripe.   All instruments were removed from the vagina. Hemostasis was noted at the tenaculum sites with pressure. The sponge and instrument counts were correct x2. The patient was taken to the PACU in stable condition.  Morgan Quinn K Taam-Akelman 04/29/21 3:49 AM

## 2021-04-29 NOTE — Brief Op Note (Signed)
04/28/2021 - 04/29/2021  3:48 AM  PATIENT:  Morgan Quinn  35 y.o. female  PRE-OPERATIVE DIAGNOSIS:  retained products  POST-OPERATIVE DIAGNOSIS:  retained products  PROCEDURE:  Procedure(s): DILATATION AND EVACUATION UNDER ULTRASOUND GUIDANCE (N/A)  SURGEON:  Surgeon(s) and Role:    * Taam-Akelman, Griselda Miner, MD - Primary  PHYSICIAN ASSISTANT:   ASSISTANTS: none   ANESTHESIA: General and  local  EBL:  10cc  BLOOD ADMINISTERED: 2u CC PRBC hung prior to OR  DRAINS: none   LOCAL MEDICATIONS USED:  10cc 1% LIDOCAINE   SPECIMEN:  Source of Specimen:  retained products of conception  DISPOSITION OF SPECIMEN:  PATHOLOGY  COUNTS:  YES  TOURNIQUET:  * No tourniquets in log *  DICTATION: .Note written in EPIC  PLAN OF CARE: Discharge to home after PACU  PATIENT DISPOSITION:  PACU - hemodynamically stable.   Delay start of Pharmacological VTE agent (>24hrs) due to surgical blood loss or risk of bleeding: not applicable

## 2021-04-29 NOTE — Anesthesia Postprocedure Evaluation (Signed)
Anesthesia Post Note  Patient: Morgan Quinn  Procedure(s) Performed: DILATATION AND EVACUATION UNDER ULTRASOUND GUIDANCE     Patient location during evaluation: PACU Anesthesia Type: General Level of consciousness: awake Pain management: pain level controlled Vital Signs Assessment: post-procedure vital signs reviewed and stable Respiratory status: spontaneous breathing, nonlabored ventilation, respiratory function stable and patient connected to nasal cannula oxygen Cardiovascular status: blood pressure returned to baseline and stable Postop Assessment: no apparent nausea or vomiting Anesthetic complications: no   No notable events documented.  Last Vitals:  Vitals:   04/29/21 0450 04/29/21 0515  BP: (!) 118/91 100/65  Pulse: 74 83  Resp: 16 16  Temp: 36.4 C 36.4 C  SpO2: 100% 100%    Last Pain:  Vitals:   04/29/21 0515  TempSrc: Axillary  PainSc: 3                  Morgan Quinn P Grafton Warzecha

## 2021-04-29 NOTE — Transfer of Care (Signed)
Immediate Anesthesia Transfer of Care Note  Patient: Morgan Quinn  Procedure(s) Performed: DILATATION AND EVACUATION UNDER ULTRASOUND GUIDANCE  Patient Location: PACU  Anesthesia Type:General  Level of Consciousness: awake, alert  and oriented  Airway & Oxygen Therapy: Patient Spontanous Breathing  Post-op Assessment: Report given to RN and Post -op Vital signs reviewed and stable  Post vital signs: Reviewed and stable  Last Vitals:  Vitals Value Taken Time  BP    Temp    Pulse 121 04/29/21 0402  Resp 9 04/29/21 0402  SpO2 100 % 04/29/21 0402  Vitals shown include unvalidated device data.  Last Pain:  Vitals:   04/29/21 0223  TempSrc: Oral  PainSc:          Complications: No notable events documented.

## 2021-04-30 ENCOUNTER — Encounter (HOSPITAL_COMMUNITY): Payer: Self-pay | Admitting: Obstetrics & Gynecology

## 2021-04-30 LAB — BPAM RBC
Blood Product Expiration Date: 202207272359
Blood Product Expiration Date: 202207272359
ISSUE DATE / TIME: 202207202207
ISSUE DATE / TIME: 202207210136
Unit Type and Rh: 9500
Unit Type and Rh: 9500

## 2021-04-30 LAB — TYPE AND SCREEN
ABO/RH(D): O NEG
Antibody Screen: POSITIVE
Unit division: 0
Unit division: 0

## 2021-04-30 LAB — SURGICAL PATHOLOGY

## 2021-05-27 ENCOUNTER — Encounter (HOSPITAL_BASED_OUTPATIENT_CLINIC_OR_DEPARTMENT_OTHER): Payer: Self-pay | Admitting: *Deleted

## 2021-05-27 ENCOUNTER — Emergency Department (HOSPITAL_BASED_OUTPATIENT_CLINIC_OR_DEPARTMENT_OTHER)
Admission: EM | Admit: 2021-05-27 | Discharge: 2021-05-27 | Disposition: A | Discharge status: Home / Self Care | Payer: 59 | Attending: Emergency Medicine | Admitting: Emergency Medicine

## 2021-05-27 ENCOUNTER — Other Ambulatory Visit: Payer: Self-pay

## 2021-05-27 ENCOUNTER — Encounter: Payer: Self-pay | Admitting: Hematology and Oncology

## 2021-05-27 DIAGNOSIS — R350 Frequency of micturition: Secondary | ICD-10-CM | POA: Diagnosis present

## 2021-05-27 DIAGNOSIS — N390 Urinary tract infection, site not specified: Secondary | ICD-10-CM | POA: Insufficient documentation

## 2021-05-27 LAB — URINALYSIS, MICROSCOPIC (REFLEX): WBC, UA: >50 WBC/hpf (ref 0–5)

## 2021-05-27 LAB — URINALYSIS, ROUTINE W REFLEX MICROSCOPIC
Bilirubin Urine: NEGATIVE
Glucose, UA: 100 mg/dL — AB
Ketones, ur: 15 mg/dL — AB
Nitrite: POSITIVE — AB
Protein, ur: 100 mg/dL — AB
Specific Gravity, Urine: >1.03 — ABNORMAL HIGH (ref 1.005–1.030)
pH: 5.5 (ref 5.0–8.0)

## 2021-05-27 MED ORDER — CEPHALEXIN 500 MG PO CAPS: 500.0000 mg | ORAL_CAPSULE | Freq: Three times a day (TID) | ORAL | 0 refills | Status: AC

## 2021-05-27 NOTE — ED Triage Notes (Signed)
She feels she has a UTI. Dysuria, urinary frequency, and bladder spasms since this am.

## 2021-05-27 NOTE — Discharge Instructions (Addendum)
Take antibiotics as prescribed.  Take entire course, even if symptoms improve. Use Tylenol or ibuprofen as needed for cramping or pain. Make sure you stay well-hydrated with water. Continue using the Azo to help with irritation and dysuria. Follow-up with the urologist listed below as needed for further evaluation of UTIs. Your urine has been sent to grow out.  If you do not have a phone call in several days, that means the antibiotic that was chosen for you today is treating the bacteria that grew out. Return to the emergency room if you develop high fevers, persistent vomiting, severe worsening pain, any new, worsening, concerning symptoms

## 2021-05-27 NOTE — ED Provider Notes (Signed)
[facility] EMERGENCY DEPARTMENT Provider Note   CSN: [id number] Arrival date & time: 05/27/21  1737     History Chief Complaint  Patient presents with   Recurrent UTI    Morgan Quinn is a 35 y.o. female presenting for evaluation of dysuria and urinary frequency.  Patient states her symptoms began today.  She states she has been taking Azo but still has a lot of discomfort.  She is also having bladder spasms.  She states this feels similar to a UTI she had several months ago.  She was treated with Macrobid, but never felt her UTI completely went away.  She denies fevers, chills, nausea, vomiting, flank pain. No vaginal discharge. Her last period was about a month ago, was normal for her.  Patient does not normally have frequent UTIs, this is new for her.  She reports no other medical problems, states she takes no medications daily.  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 04/29/2021   Symptomatic anemia 04/28/2021   [redacted] weeks gestation of pregnancy 07/23/2019   Iron deficiency anemia due to chronic blood loss 08/09/2018   Active labor 08/12/2015   Spontaneous vaginal delivery 10/26/2013    Class: Status post    Past Surgical History:  Procedure Laterality Date   CERVICAL BIOPSY  W/ LOOP ELECTRODE EXCISION     DENTAL SURGERY  July 2014, September 2013   wisdom tooth extraction; other oral surgery   DILATION AND CURETTAGE OF UTERUS N/A 04/29/2021   Procedure: DILATATION AND EVACUATION UNDER ULTRASOUND GUIDANCE;  Surgeon: Rande Brunt, MD;  Location: [facility] OR;  Service: Gynecology;  Laterality: N/A;     OB History     Gravida  6   Para  5   Term  5    Preterm  0   AB  0   Living  5      SAB  0   IAB  0   Ectopic  0   Multiple  0   Live Births  5           Family History  Problem Relation Age of Onset   Colon cancer Father 4       died at age 15    Prader-Willi syndrome Sister    Diabetes Paternal Grandmother     Social History   Tobacco Use   Smoking status: Never   Smokeless tobacco: Never  Vaping Use   Vaping Use: Never used  Substance Use Topics   Alcohol use: Not Currently    Comment: social   Drug use: No    Home Medications Prior to Admission medications   Medication Sig Start Date End Date Taking? Authorizing Provider  cephALEXin (KEFLEX) 500 MG capsule Take 1 capsule (500 mg total) by mouth 3 (three) times daily for 5 days. 05/27/21 06/01/21 Yes Jerrell Mangel, PA-C  ibuprofen (ADVIL) 800 MG tablet Take 1 tablet (800 mg total) by mouth every 8 (eight) hours as needed. 04/29/21   Rande Brunt, MD    Allergies    Patient has no  known allergies.  Review of Systems   Review of Systems  Genitourinary:  Positive for dysuria, frequency and pelvic pain.  All other systems reviewed and are negative.  Physical Exam Updated Vital Signs BP (!) 137/96 (BP Location: Left Arm)   Pulse 72   Temp 98.4 F (36.9 C) (Oral)   Resp 18   Ht 5\' 3"  (1.6 m)   Wt 90.7 kg   LMP 04/29/2021   SpO2 100%   Breastfeeding Unknown   BMI 35.42 kg/m   Physical Exam Vitals and nursing note reviewed.  Constitutional:      General: She is not in acute distress.    Appearance: Normal appearance.     Comments: Resting in the bed in no acute distress  HENT:     Head: Normocephalic and atraumatic.  Eyes:     Conjunctiva/sclera: Conjunctivae normal.     Pupils: Pupils are equal, round, and reactive to light.  Cardiovascular:     Rate and Rhythm: Normal rate and regular rhythm.     Pulses: Normal pulses.  Pulmonary:     Effort: Pulmonary effort is normal. No respiratory distress.     Breath sounds:  Normal breath sounds. No wheezing.     Comments: Speaking in full sentences.  Clear lung sounds in all fields. Abdominal:     General: There is no distension.     Palpations: Abdomen is soft. There is no mass.     Tenderness: There is no abdominal tenderness. There is no right CVA tenderness, left CVA tenderness, guarding or rebound.     Comments: No tenderness palpation the abdomen.  No flank pain.  Musculoskeletal:        General: Normal range of motion.     Cervical back: Normal range of motion and neck supple.  Skin:    General: Skin is warm and dry.     Capillary Refill: Capillary refill takes less than 2 seconds.  Neurological:     Mental Status: She is alert and oriented to person, place, and time.  Psychiatric:        Mood and Affect: Mood and affect normal.        Speech: Speech normal.        Behavior: Behavior normal.    ED Results / Procedures / Treatments   Labs (all labs ordered are listed, but only abnormal results are displayed) Labs Reviewed  URINALYSIS, ROUTINE W REFLEX MICROSCOPIC - Abnormal; Notable for the following components:      Result Value   Color, Urine ORANGE (*)    APPearance CLOUDY (*)    Specific Gravity, Urine >1.030 (*)    Glucose, UA 100 (*)    Hgb urine dipstick MODERATE (*)    Ketones, ur 15 (*)    Protein, ur 100 (*)    Nitrite POSITIVE (*)    Leukocytes,Ua MODERATE (*)    All other components within normal limits  URINALYSIS, MICROSCOPIC (REFLEX) - Abnormal; Notable for the following components:   Bacteria, UA MANY (*)    All other components within normal limits  URINE CULTURE    EKG None  Radiology No results found.  Procedures Procedures   Medications Ordered in ED Medications - No data to display  ED Course  I have reviewed the triage vital signs and the nursing notes.  Pertinent labs & imaging results that were available during my care of the patient were reviewed by me and considered in my medical decision making  (see chart  for details).    MDM Rules/Calculators/A&P                           Patient presenting for evaluation of urinary symptoms.  On exam, patient appears nontoxic.  No fever.  Exam is not consistent with Pilo or sepsis.  Urine obtained in triage interpreted by me, consistent with UTI.  As this is patient's second UTI in several months, will obtain culture.  Will treat with Keflex.  Discussed findings and plan with patient, who is agreeable.  Patient is extremely concerned that this is her second UTI, as such, will give urology information  for f/u as needed.  At this time, patient appears safe for discharge.  Return precautions given.  Patient states she understands and agrees to plan.  Final Clinical Impression(s) / ED Diagnoses Final diagnoses:  Urinary tract infection without hematuria, site unspecified    Rx / DC Orders ED Discharge Orders          Ordered    cephALEXin (KEFLEX) 500 MG capsule  3 times daily        05/27/21 1847             Alveria Apley, PA-C 05/27/21 1915    Virgina Norfolk, DO 05/27/21 2208

## 2021-05-27 NOTE — ED Notes (Signed)
Lab [facility]) informed urine Cx add on.

## 2021-05-30 LAB — URINE CULTURE: Culture: 60000 — AB

## 2021-05-31 ENCOUNTER — Telehealth: Payer: Self-pay

## 2021-05-31 NOTE — Telephone Encounter (Signed)
Post ED Visit - Positive Culture Follow-up  Culture report reviewed by antimicrobial stewardship pharmacist: [facility] [x]  , Pharm.D. []  Anson Crofts, Pharm.D., BCPS AQ-ID []  , Pharm.D., BCPS []  Celedonio Miyamoto, .D., BCPS []  [place], .D., BCPS, AAHIVP []  Georgina Pillion, Pharm.D., BCPS, AAHIVP []  [address], PharmD, BCPS []  , PharmD, BCPS []  [place], PharmD, BCPS []  [place], PharmD []  , PharmD, BCPS []  Estella Husk, PharmD  Pharmacy Team []  Lysle Pearl, PharmD []  , PharmD []  Phillips Climes, PharmD []  , Rph []  Agapito Games) , PharmD []  Verlan Friends, PharmD []  , PharmD []  Mervyn Gay, PharmD []  , PharmD []  Vinnie Level, PharmD []  [facility], PharmD []  , PharmD []  Len Childs, PharmD   Positive urine culture Treated with Cephalexin organism sensitive to the same and no further patient follow-up is required at this time.  05/31/2021, 10:25 AM

## 2021-07-06 ENCOUNTER — Other Ambulatory Visit: Payer: Self-pay

## 2021-07-06 ENCOUNTER — Emergency Department (HOSPITAL_BASED_OUTPATIENT_CLINIC_OR_DEPARTMENT_OTHER)
Admission: EM | Admit: 2021-07-06 | Discharge: 2021-07-06 | Disposition: A | Discharge status: Home / Self Care | Payer: 59 | Attending: Emergency Medicine | Admitting: Emergency Medicine

## 2021-07-06 ENCOUNTER — Encounter (HOSPITAL_BASED_OUTPATIENT_CLINIC_OR_DEPARTMENT_OTHER): Payer: Self-pay

## 2021-07-06 ENCOUNTER — Encounter: Payer: Self-pay | Admitting: Hematology and Oncology

## 2021-07-06 DIAGNOSIS — M546 Pain in thoracic spine: Secondary | ICD-10-CM | POA: Diagnosis not present

## 2021-07-06 DIAGNOSIS — Y92410 Unspecified street and highway as the place of occurrence of the external cause: Secondary | ICD-10-CM | POA: Diagnosis not present

## 2021-07-06 DIAGNOSIS — S161XXA Strain of muscle, fascia and tendon at neck level, initial encounter: Secondary | ICD-10-CM | POA: Insufficient documentation

## 2021-07-06 DIAGNOSIS — M545 Low back pain, unspecified: Secondary | ICD-10-CM | POA: Diagnosis not present

## 2021-07-06 DIAGNOSIS — S199XXA Unspecified injury of neck, initial encounter: Secondary | ICD-10-CM | POA: Diagnosis present

## 2021-07-06 MED ORDER — NAPROXEN 500 MG PO TABS: 500.0000 mg | ORAL_TABLET | Freq: Two times a day (BID) | ORAL | 0 refills | Status: DC | PRN

## 2021-07-06 MED ORDER — CYCLOBENZAPRINE HCL 10 MG PO TABS: 10.0000 mg | ORAL_TABLET | Freq: Every evening | ORAL | 0 refills | Status: DC | PRN

## 2021-07-06 MED ORDER — NAPROXEN 250 MG PO TABS
500.0000 mg | ORAL_TABLET | Freq: Once | ORAL | Status: AC
  Administered 2021-07-06: 500 mg via ORAL
  Filled 2021-07-06: qty 2

## 2021-07-06 NOTE — Discharge Instructions (Signed)
I am prescribing you a strong muscle relaxer called flexeril. Please only take this medication once in the evening with dinner. This medication can make you quite drowsy. Do not mix it with alcohol. Do not drive a vehicle after taking it.   I am prescribing you an anti-inflammatory medication called naproxen.  This is similar to Aleve.  You can take this up to 2 times a day for management of your pain.  Try to take it with a small amount of food to help prevent stomach irritation.  Please adhere to the "RICE Method" for rehabbing. This stands for "rest, ice, compress, and elevate". Please continue to perform stretches and exercise as much as your pain allows. It is important that you keep moving to help heal your injury.   Please return to the emergency department with any new or worsening symptoms.  It was a pleasure to meet you.
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
## 2021-08-30 ENCOUNTER — Encounter: Payer: Self-pay | Admitting: Hematology and Oncology

## 2021-09-06 ENCOUNTER — Encounter (INDEPENDENT_AMBULATORY_CARE_PROVIDER_SITE_OTHER): Payer: Self-pay

## 2021-09-06 ENCOUNTER — Ambulatory Visit (INDEPENDENT_AMBULATORY_CARE_PROVIDER_SITE_OTHER): Payer: Self-pay | Admitting: Family Medicine

## 2021-09-20 ENCOUNTER — Ambulatory Visit (INDEPENDENT_AMBULATORY_CARE_PROVIDER_SITE_OTHER): Payer: Self-pay | Admitting: Family Medicine

## 2021-09-20 ENCOUNTER — Encounter: Payer: Self-pay | Admitting: Hematology and Oncology

## 2021-09-20 ENCOUNTER — Inpatient Hospital Stay (HOSPITAL_COMMUNITY)
Admission: AD | Admit: 2021-09-20 | Discharge: 2021-09-20 | Disposition: A | Discharge status: Home / Self Care | Payer: 59 | Attending: Obstetrics and Gynecology | Admitting: Obstetrics and Gynecology

## 2021-09-20 ENCOUNTER — Encounter (HOSPITAL_COMMUNITY): Payer: Self-pay | Admitting: Obstetrics and Gynecology

## 2021-09-20 ENCOUNTER — Inpatient Hospital Stay (HOSPITAL_COMMUNITY): Payer: 59

## 2021-09-20 DIAGNOSIS — R1030 Lower abdominal pain, unspecified: Secondary | ICD-10-CM | POA: Insufficient documentation

## 2021-09-20 DIAGNOSIS — O209 Hemorrhage in early pregnancy, unspecified: Secondary | ICD-10-CM | POA: Diagnosis not present

## 2021-09-20 DIAGNOSIS — O039 Complete or unspecified spontaneous abortion without complication: Secondary | ICD-10-CM | POA: Insufficient documentation

## 2021-09-20 DIAGNOSIS — Z6791 Unspecified blood type, Rh negative: Secondary | ICD-10-CM | POA: Diagnosis not present

## 2021-09-20 DIAGNOSIS — R109 Unspecified abdominal pain: Secondary | ICD-10-CM | POA: Diagnosis not present

## 2021-09-20 DIAGNOSIS — Z3A08 8 weeks gestation of pregnancy: Secondary | ICD-10-CM | POA: Insufficient documentation

## 2021-09-20 DIAGNOSIS — O26891 Other specified pregnancy related conditions, first trimester: Secondary | ICD-10-CM | POA: Diagnosis not present

## 2021-09-20 LAB — CBC
HCT: 30.1 % — ABNORMAL LOW (ref 36.0–46.0)
Hemoglobin: 9.2 g/dL — ABNORMAL LOW (ref 12.0–15.0)
MCH: 23.3 pg — ABNORMAL LOW (ref 26.0–34.0)
MCHC: 30.6 g/dL (ref 30.0–36.0)
MCV: 76.2 fL — ABNORMAL LOW (ref 80.0–100.0)
Platelets: 311 10*3/uL (ref 150–400)
RBC: 3.95 MIL/uL (ref 3.87–5.11)
RDW: 18.8 % — ABNORMAL HIGH (ref 11.5–15.5)
WBC: 6.6 10*3/uL (ref 4.0–10.5)
nRBC: 0 % (ref 0.0–0.2)

## 2021-09-20 LAB — URINALYSIS, ROUTINE W REFLEX MICROSCOPIC
Bilirubin Urine: NEGATIVE
Glucose, UA: NEGATIVE mg/dL
Ketones, ur: NEGATIVE mg/dL
Leukocytes,Ua: NEGATIVE
Nitrite: NEGATIVE
Protein, ur: NEGATIVE mg/dL
RBC / HPF: >50 RBC/hpf — ABNORMAL HIGH (ref 0–5)
Specific Gravity, Urine: 1.017 (ref 1.005–1.030)
pH: 7 (ref 5.0–8.0)

## 2021-09-20 LAB — POCT PREGNANCY, URINE: Preg Test, Ur: POSITIVE — AB

## 2021-09-20 LAB — HCG, QUANTITATIVE, PREGNANCY: hCG, Beta Chain, Quant, S: 59792 m[IU]/mL — ABNORMAL HIGH (ref <5)

## 2021-09-20 MED ORDER — OXYCODONE-ACETAMINOPHEN 5-325 MG PO TABS: 1.0000 | ORAL_TABLET | Freq: Four times a day (QID) | ORAL | 0 refills | Status: AC | PRN

## 2021-09-20 MED ORDER — RHO D IMMUNE GLOBULIN 1500 UNIT/2ML IJ SOSY
300.0000 ug | PREFILLED_SYRINGE | Freq: Once | INTRAMUSCULAR | Status: DC
  Filled 2021-09-20: qty 2

## 2021-09-20 MED ORDER — HYDROMORPHONE HCL 1 MG/ML IJ SOLN
1.0000 mg | Freq: Once | INTRAMUSCULAR | Status: AC
  Administered 2021-09-20: 1 mg via INTRAVENOUS
  Filled 2021-09-20: qty 1

## 2021-09-20 MED ORDER — OXYCODONE HCL 5 MG PO TABS
5.0000 mg | ORAL_TABLET | Freq: Three times a day (TID) | ORAL | 0 refills | Status: DC | PRN
Start: 2021-09-20 — End: 2021-09-20

## 2021-09-20 MED ORDER — MISOPROSTOL 200 MCG PO TABS: 800.0000 ug | ORAL_TABLET | Freq: Once | ORAL | 0 refills | Status: DC

## 2021-09-20 NOTE — Progress Notes (Signed)
RN called to room. Patient stated that she wants to opt out of receiving Rhogam injection. RN explained to patient risks of not receiving medication. Patient verbalized understanding and agreed. Patient denied any specific reason to why she declined medication. She stated that she just does not want to get the medication.  Morgan Spire, NP made aware.

## 2021-09-20 NOTE — MAU Provider Note (Signed)
History     CSN: [id number]  Arrival date and time: 09/20/21 1017   Event Date/Time   First Provider Initiated Contact with Patient 09/20/21 1106      Chief Complaint  Patient presents with   Vaginal Bleeding   Abdominal Pain   HPI Morgan Quinn is a 35 y.o. [id number] at [redacted]w[redacted]d who presents with vaginal bleeding & abdominal pain. Symptoms started at 3 am this morning. Reports initially was pink spotting but gradually increased to needing a pad. Passed several small clots. Rates lower abdominal cramping 10/10. Hasn't treated symptoms. Pain radiates to low back and having rectal pressure.  Denies dizziness, palpitations, fever, or vaginal discharge. Goes to [facility] but hasn't been seen yet with this pregnancy.   OB History     Gravida  7   Para  5   Term  5   Preterm  0   AB  1   Living  5      SAB  1   IAB  0   Ectopic  0   Multiple  0   Live Births  5           Past Medical History:  Diagnosis Date   Anxiety    Cervical dysplasia    Depression    pp for 3 mos; was on medication   Genital warts    h/o cryo    Menorrhagia    Panic attacks    Vaginal Pap smear, abnormal     Past Surgical History:  Procedure Laterality Date   CERVICAL BIOPSY  W/ LOOP ELECTRODE EXCISION     DENTAL SURGERY  July 2014, September 2013   wisdom tooth extraction; other oral surgery   DILATION AND CURETTAGE OF UTERUS N/A 04/29/2021   Procedure: DILATATION AND EVACUATION UNDER ULTRASOUND GUIDANCE;  Surgeon: Rande Brunt, MD;  Location: [facility] OR;  Service: Gynecology;  Laterality: N/A;    Family History  Problem Relation Age of Onset   Colon cancer Father 66       died at age 67    Prader-Willi syndrome Sister    Diabetes Paternal Grandmother     Social History   Tobacco Use   Smoking status: Never   Smokeless tobacco: Never  Vaping Use   Vaping Use: Never used  Substance Use Topics   Alcohol use: Not Currently   Drug use: No     Allergies: No Known Allergies  Medications Prior to Admission  Medication Sig Dispense Refill Last Dose   cyclobenzaprine (FLEXERIL) 10 MG tablet Take 1 tablet (10 mg total) by mouth at bedtime as needed for muscle spasms. 10 tablet 0    ibuprofen (ADVIL) 800 MG tablet Take 1 tablet (800 mg total) by mouth every 8 (eight) hours as needed. 30 tablet 0    naproxen (NAPROSYN) 500 MG tablet Take 1 tablet (500 mg total) by mouth 2 (two) times daily as needed. 20 tablet 0     Review of Systems  Constitutional: Negative.   Gastrointestinal:  Positive for abdominal pain.  Genitourinary:  Positive for vaginal bleeding.  Physical Exam   Blood pressure 130/77, pulse 91, temperature 98.2 F (36.8 C), temperature source Oral, resp. rate 17, height 5\' 3"  (1.6 m), weight 86.2 kg, last menstrual period 07/25/2021, SpO2 100 %, unknown if currently breastfeeding.  Physical Exam Vitals and nursing note reviewed. Exam conducted with a chaperone present.  Constitutional:      General: She is not in acute  distress.    Appearance: She is well-developed. She is not ill-appearing.  HENT:     Head: Normocephalic and atraumatic.  Eyes:     General: No scleral icterus. Pulmonary:     Effort: Pulmonary effort is normal. No respiratory distress.  Abdominal:     General: Abdomen is flat.     Palpations: Abdomen is soft.     Tenderness: There is no abdominal tenderness.  Genitourinary:    Comments: Pelvic: NEFG. POC sitting in vagina - 8 wk fetus in intact sac. Scant dark red blood after POC removed.  Skin:    General: Skin is warm and dry.  Neurological:     General: No focal deficit present.     Mental Status: She is alert.  Psychiatric:        Mood and Affect: Mood normal.        Behavior: Behavior normal.    MAU Course  Procedures Results for orders placed or performed during the hospital encounter of 09/20/21 (from the past 24 hour(s))  Pregnancy, urine POC     Status: Abnormal    Collection Time: 09/20/21 10:35 AM  Result Value Ref Range   Preg Test, Ur POSITIVE (A) NEGATIVE  Urinalysis, Routine w reflex microscopic Urine, Clean Catch     Status: Abnormal   Collection Time: 09/20/21 10:50 AM  Result Value Ref Range   Color, Urine YELLOW YELLOW   APPearance HAZY (A) CLEAR   Specific Gravity, Urine 1.017 1.005 - 1.030   pH 7.0 5.0 - 8.0   Glucose, UA NEGATIVE NEGATIVE mg/dL   Hgb urine dipstick LARGE (A) NEGATIVE   Bilirubin Urine NEGATIVE NEGATIVE   Ketones, ur NEGATIVE NEGATIVE mg/dL   Protein, ur NEGATIVE NEGATIVE mg/dL   Nitrite NEGATIVE NEGATIVE   Leukocytes,Ua NEGATIVE NEGATIVE   RBC / HPF >50 (H) 0 - 5 RBC/hpf   WBC, UA 0-5 0 - 5 WBC/hpf   Bacteria, UA RARE (A) NONE SEEN   Squamous Epithelial / LPF 0-5 0 - 5   Mucus PRESENT   CBC     Status: Abnormal   Collection Time: 09/20/21 11:20 AM  Result Value Ref Range   WBC 6.6 4.0 - 10.5 K/uL   RBC 3.95 3.87 - 5.11 MIL/uL   Hemoglobin 9.2 (L) 12.0 - 15.0 g/dL   HCT 46.9 (L) 62.9 - 52.8 %   MCV 76.2 (L) 80.0 - 100.0 fL   MCH 23.3 (L) 26.0 - 34.0 pg   MCHC 30.6 30.0 - 36.0 g/dL   RDW 41.3 (H) 24.4 - 01.0 %   Platelets 311 150 - 400 K/uL   nRBC 0.0 0.0 - 0.2 %  hCG, quantitative, pregnancy     Status: Abnormal   Collection Time: 09/20/21 11:20 AM  Result Value Ref Range   hCG, Beta Chain, Quant, S 59,792 (H) <5 mIU/mL  Rh IG workup (includes ABO/Rh)     Status: None (Preliminary result)   Collection Time: 09/20/21 11:27 AM  Result Value Ref Range   Gestational Age(Wks) 8    ABO/RH(D) O NEG    Antibody Screen POS    Antibody Identification PASSIVELY ACQUIRED ANTI-D    Unit Number [id number]    Blood Component Type RHIG    Unit division 00    Status of Unit ALLOCATED    Transfusion Status OK TO TRANSFUSE    US OB LESS THAN 14 WEEKS WITH OB TRANSVAGINAL  Result Date: 09/20/2021 CLINICAL DATA:  Positive pregnancy test with vaginal bleeding.  EXAM: OBSTETRIC <14 WK [place] AND TRANSVAGINAL OB US  TECHNIQUE: Both transabdominal and transvaginal ultrasound examinations were performed for complete evaluation of the gestation as well as the maternal uterus, adnexal regions, and pelvic cul-de-sac. Transvaginal technique was performed to assess early pregnancy. COMPARISON:  None. FINDINGS: Intrauterine gestational sac: None. Yolk sac:  N/A Embryo:  N/A Cardiac Activity: N/A Subchorionic hemorrhage:  None visualized. Maternal uterus/adnexae: Endometrium appears mildly thickened and irregular, nonspecific. No evidence for ovarian or adnexal mass. Trace simple appearing free fluid is noted in the cul-de-sac. IMPRESSION: 1. No evidence for intrauterine gestational sac. Given the history of a positive pregnancy test, differential considerations include intrauterine gestation too early to visualize, completed abortion, or nonvisualized ectopic pregnancy. Close clinical correlation is recommended with serial beta-hCG and followup ultrasound as warranted. Electronically Signed   By: Kennith Center M.D.   On: 09/20/2021 12:45    MDM Patient passed POCs in MAU. Ultrasound shows thickened endometrium consistent with SAB that just occurred. Hgb is 9.2, previously 7.6. She is asymptomatic and has normal vitals. Will continue oral iron supplements.   Reviewed results with Dr. Crissie Reese - u/s appropriate for SAB but can offer cytotec for bleeding/thickened endometrium. Pt would like cytotec & pain medication. Will rx oxycodone #12.   Dr. Senaida Ores notified of patient & will reach out to [facility] for patient to have f/u appointment in 1 week.   Patient is rh negative. Discussed need for rhogam. Patient initially agreeable. Prior to receiving injection she states she does not want to receive. Patient aware of purpose of receiving rhogam & complications that can occur if not given. Patient continues to decline rhogam.   After discharge placed & prescriptions sent, patient adamant that she receive rx for percocet instead of  oxycodone. Discussed that oxycodone is in percocet & that she can supplement oxycodone with tylenol if needed - pt states she only wants percocet. Called pharmacy to cancel initial oxycodone prescription & sent in another prescription for percocet #12.   Assessment and Plan   1. Miscarriage  -POC sent to pathology per Dr. Senaida Ores -pt stable for discharge -reviewed bleeding & infection precautions -f/u with [facility] next week  2. Vaginal bleeding in pregnancy, first trimester   3. Abdominal pain during pregnancy in first trimester   4. Rh negative state in antepartum period, first trimester  -pt refused rhogam  5. [redacted] weeks gestation of pregnancy      Judeth Horn 09/20/2021, 11:06 AM

## 2021-09-20 NOTE — MAU Note (Addendum)
Patient arrived to MAU complaining of spotting at 3 am which turned heavy at 8am. Patient stated that she has saturated a pad within an hour. Patient also states she is having abdominal cramping, and back pain that is a 10/10. She reports she is 8 wks. LMP 07/25/21

## 2021-09-20 NOTE — Discharge Instructions (Signed)
Return to care  If you have heavier bleeding that soaks through more than 2 pads per hour for an hour or more If you bleed so much that you feel like you might pass out or you do pass out If you have significant abdominal pain that is not improved with Tylenol   

## 2021-09-21 LAB — RH IG WORKUP (INCLUDES ABO/RH)
ABO/RH(D): O NEG
Antibody Screen: POSITIVE
Gestational Age(Wks): 8
Unit division: 0

## 2021-09-22 LAB — SURGICAL PATHOLOGY

## 2021-12-01 ENCOUNTER — Encounter: Payer: Self-pay | Admitting: Hematology and Oncology

## 2022-05-18 ENCOUNTER — Encounter (INDEPENDENT_AMBULATORY_CARE_PROVIDER_SITE_OTHER): Payer: Self-pay

## 2022-07-07 ENCOUNTER — Emergency Department (HOSPITAL_BASED_OUTPATIENT_CLINIC_OR_DEPARTMENT_OTHER)
Admission: EM | Admit: 2022-07-07 | Discharge: 2022-07-07 | Disposition: A | Discharge status: Home / Self Care | Payer: Medicaid Other | Attending: Emergency Medicine | Admitting: Emergency Medicine

## 2022-07-07 ENCOUNTER — Encounter (HOSPITAL_BASED_OUTPATIENT_CLINIC_OR_DEPARTMENT_OTHER): Payer: Self-pay

## 2022-07-07 ENCOUNTER — Other Ambulatory Visit: Payer: Self-pay

## 2022-07-07 ENCOUNTER — Encounter: Payer: Self-pay | Admitting: Hematology and Oncology

## 2022-07-07 DIAGNOSIS — Z20822 Contact with and (suspected) exposure to covid-19: Secondary | ICD-10-CM | POA: Diagnosis not present

## 2022-07-07 DIAGNOSIS — J069 Acute upper respiratory infection, unspecified: Secondary | ICD-10-CM

## 2022-07-07 DIAGNOSIS — R0981 Nasal congestion: Secondary | ICD-10-CM | POA: Diagnosis present

## 2022-07-07 LAB — RESP PANEL BY RT-PCR (FLU A&B, COVID) ARPGX2
Influenza A by PCR: NEGATIVE
Influenza B by PCR: NEGATIVE
SARS Coronavirus 2 by RT PCR: NEGATIVE

## 2022-07-07 NOTE — Discharge Instructions (Signed)
Likely a viral infection, recommend over-the-counter pain medications like ibuprofen Tylenol for fever and pain control, nasal decongestions like Flonase and Zyrtec, Mucinex for cough.  If not eating recommend supplementing with Gatorade to help with electrolyte supplementation.  Follow-up PCP for further evaluation.  Come back to the emergency department if you develop chest pain, shortness of breath, severe abdominal pain, uncontrolled nausea, vomiting, diarrhea.  

## 2022-07-07 NOTE — ED Notes (Signed)
Discharge instructions reviewed with patient. Patient verbalizes understanding, no further questions at this time. Medications/prescriptions and follow up information provided. No acute distress noted at time of departure.   Unable to obtain vitals at time of departure, pt ready to leave

## 2022-07-07 NOTE — ED Provider Notes (Signed)
[facility] EM[facility]GENCY DEPARTMENT Provider Note   CSN: [id number] Arrival date & time: 07/07/22  1224     History  Chief Complaint  Patient presents with   URI    Morgan Quinn is a 36 y.o. female.  HPI  Without medical history presents with complaints of URI-like symptoms, started Monday, endorses nasal congestion, headaches and a slight cough, as well as fevers and chills, general body aches, no chest pain no shortness of breath decreased p.o. intake with still tolerating p.o., not immunocompromise, has not gotten her COVID or influenza vaccine, kids have similar presentation.     Home Medications Prior to Admission medications   Medication Sig Start Date End Date Taking? Authorizing Provider  cyclobenzaprine (FLEXERIL) 10 MG tablet Take 1 tablet (10 mg total) by mouth at bedtime as needed for muscle spasms. 07/06/21   Rayna Sexton, PA-C  ibuprofen (ADVIL) 800 MG tablet Take 1 tablet (800 mg total) by mouth every 8 (eight) hours as needed. 04/29/21   Taam-Akelman, Lawrence Santiago, MD  misoprostol (CYTOTEC) 200 MCG tablet Take 4 tablets (800 mcg total) by mouth once for 1 dose. 09/20/21 09/20/21  Jorje Guild, NP  naproxen (NAPROSYN) 500 MG tablet Take 1 tablet (500 mg total) by mouth 2 (two) times daily as needed. 07/06/21   Rayna Sexton, PA-C      Allergies    Patient has no known allergies.    Review of Systems   Review of Systems  Constitutional:  Positive for chills and fever.  HENT:  Positive for congestion.   Respiratory:  Positive for cough. Negative for shortness of breath.   Cardiovascular:  Negative for chest pain.  Gastrointestinal:  Negative for abdominal pain, nausea and vomiting.  Musculoskeletal:  Positive for myalgias.  Neurological:  Positive for headaches.    Physical Exam Updated Vital Signs BP 114/84 (BP Location: Right Arm)   Pulse 77   Temp 98.8 F (37.1 C) (Oral)   Resp 17   Ht 5\' 3"  (1.6 m)   Wt 76.7 kg   LMP 07/07/2022    SpO2 100%   BMI 29.94 kg/m  Physical Exam Vitals and nursing note reviewed.  Constitutional:      General: She is not in acute distress.    Appearance: She is not ill-appearing.  HENT:     Head: Normocephalic and atraumatic.     Nose: No congestion.  Eyes:     Conjunctiva/sclera: Conjunctivae normal.  Cardiovascular:     Rate and Rhythm: Normal rate and regular rhythm.     Pulses: Normal pulses.     Heart sounds: No murmur heard.    No friction rub. No gallop.  Pulmonary:     Effort: No respiratory distress.     Breath sounds: No wheezing, rhonchi or rales.  Skin:    General: Skin is warm and dry.  Neurological:     Mental Status: She is alert.  Psychiatric:        Mood and Affect: Mood normal.     ED Results / Procedures / Treatments   Labs (all labs ordered are listed, but only abnormal results are displayed) Labs Reviewed  RESP PANEL BY RT-PCR (FLU A&B, COVID) [id number]    EKG None  Radiology No results found.  Procedures Procedures    Medications Ordered in ED Medications - No data to display  ED Course/ Medical Decision Making/ A&P  Medical Decision Making  This patient presents to the ED for concern of URI-like symptoms, this involves an extensive number of treatment options, and is a complaint that carries with it a high risk of complications and morbidity.  The differential diagnosis includes URI, pneumonia, strep throat    Additional history obtained:  Additional history obtained N/A External records from outside source obtained and reviewed including pediatrician notes   Co morbidities that complicate the patient evaluation  N/A  Social Determinants of Health:  N/A    Lab Tests:  I Ordered, and personally interpreted labs.  The pertinent results include: Respiratory panel negative   Imaging Studies ordered:  I ordered imaging studies including N/A I independently visualized and interpreted imaging which  showed N/A I agree with the radiologist interpretation   Cardiac Monitoring:  The patient was maintained on a cardiac monitor.  I personally viewed and interpreted the cardiac monitored which showed an underlying rhythm of: N/A   Medicines ordered and prescription drug management:  I ordered medication including N/A I have reviewed the patients home medicines and have made adjustments as needed  Critical Interventions:  N/A   Reevaluation:  Triage obtain respiratory panel was negative, benign physical exam agreement plan discharge.    Consultations Obtained:  N/A  Test Considered:  X-ray chest-deferred my suspicion for pneumonia is low at this time lung sounds are clear bilaterally presentation is atypical of etiology.    Rule out Low suspicion for systemic infection as patient is nontoxic-appearing, vital signs reassuring, no obvious source infection noted on exam.  Low suspicion for pneumonia as lung sounds are clear bilaterally. patient is PERC.  low suspicion patient would need  hospitalized due to viral infection or Covid as vital signs reassuring, patient is not in respiratory distress.      Dispostion and problem list  After consideration of the diagnostic results and the patients response to treatment, I feel that the patent would benefit from discharge.  URI-likely viral nature, recommend symptom management, follow-up PCP as needed strict return precautions.         Final Clinical Impression(s) / ED Diagnoses Final diagnoses:  Upper respiratory tract infection, unspecified type    Rx / DC Orders ED Discharge Orders     None         Marcello Fennel, PA-C 07/07/22 1553    Gareth Morgan, MD 07/08/22 2347

## 2022-07-07 NOTE — ED Triage Notes (Signed)
Pt to ED c/o URI symptoms since Sunday. Cough, generalized weakness, headache. Known sick contacts in the home, pt children have similar symptoms. Reports no relief with OTC Meds

## 2022-07-30 ENCOUNTER — Encounter (HOSPITAL_COMMUNITY): Payer: Self-pay | Admitting: Obstetrics and Gynecology

## 2022-07-30 ENCOUNTER — Inpatient Hospital Stay (EMERGENCY_DEPARTMENT_HOSPITAL)
Admission: AD | Admit: 2022-07-30 | Discharge: 2022-07-31 | Disposition: A | Discharge status: Home / Self Care | Payer: Medicaid Other | Source: Home / Self Care | Attending: Obstetrics and Gynecology | Admitting: Obstetrics and Gynecology

## 2022-07-30 ENCOUNTER — Inpatient Hospital Stay (HOSPITAL_COMMUNITY): Payer: Medicaid Other

## 2022-07-30 DIAGNOSIS — R42 Dizziness and giddiness: Secondary | ICD-10-CM | POA: Insufficient documentation

## 2022-07-30 DIAGNOSIS — O039 Complete or unspecified spontaneous abortion without complication: Secondary | ICD-10-CM | POA: Insufficient documentation

## 2022-07-30 DIAGNOSIS — Z3201 Encounter for pregnancy test, result positive: Secondary | ICD-10-CM | POA: Insufficient documentation

## 2022-07-30 DIAGNOSIS — D649 Anemia, unspecified: Secondary | ICD-10-CM | POA: Insufficient documentation

## 2022-07-30 HISTORY — DX: Anemia, unspecified: D64.9

## 2022-07-30 LAB — CBC WITH DIFFERENTIAL/PLATELET
Abs Immature Granulocytes: 0.02 10*3/uL (ref 0.00–0.07)
Basophils Absolute: 0 10*3/uL (ref 0.0–0.1)
Basophils Relative: 0 %
Eosinophils Absolute: 0.1 10*3/uL (ref 0.0–0.5)
Eosinophils Relative: 1 %
HCT: 25.9 % — ABNORMAL LOW (ref 36.0–46.0)
Hemoglobin: 7.8 g/dL — ABNORMAL LOW (ref 12.0–15.0)
Immature Granulocytes: 0 %
Lymphocytes Relative: 18 %
Lymphs Abs: 1.7 10*3/uL (ref 0.7–4.0)
MCH: 25.4 pg — ABNORMAL LOW (ref 26.0–34.0)
MCHC: 30.1 g/dL (ref 30.0–36.0)
MCV: 84.4 fL (ref 80.0–100.0)
Monocytes Absolute: 0.7 10*3/uL (ref 0.1–1.0)
Monocytes Relative: 7 %
Neutro Abs: 7 10*3/uL (ref 1.7–7.7)
Neutrophils Relative %: 74 %
Platelets: 357 10*3/uL (ref 150–400)
RBC: 3.07 MIL/uL — ABNORMAL LOW (ref 3.87–5.11)
RDW: 15.8 % — ABNORMAL HIGH (ref 11.5–15.5)
WBC: 9.5 10*3/uL (ref 4.0–10.5)
nRBC: 0 % (ref 0.0–0.2)

## 2022-07-30 LAB — BASIC METABOLIC PANEL
Anion gap: 12 (ref 5–15)
BUN: 7 mg/dL (ref 6–20)
CO2: 21 mmol/L — ABNORMAL LOW (ref 22–32)
Calcium: 8.7 mg/dL — ABNORMAL LOW (ref 8.9–10.3)
Chloride: 103 mmol/L (ref 98–111)
Creatinine, Ser: 0.74 mg/dL (ref 0.44–1.00)
GFR, Estimated: >60 mL/min (ref >60)
Glucose, Bld: 86 mg/dL (ref 70–99)
Potassium: 3.2 mmol/L — ABNORMAL LOW (ref 3.5–5.1)
Sodium: 136 mmol/L (ref 135–145)

## 2022-07-30 LAB — HCG, QUANTITATIVE, PREGNANCY: hCG, Beta Chain, Quant, S: 10566 m[IU]/mL — ABNORMAL HIGH (ref <5)

## 2022-07-30 LAB — POCT PREGNANCY, URINE: Preg Test, Ur: POSITIVE — AB

## 2022-07-30 MED ORDER — HYDROMORPHONE HCL 1 MG/ML IJ SOLN
1.0000 mg | Freq: Once | INTRAMUSCULAR | Status: AC
  Administered 2022-07-30: 1 mg via INTRAVENOUS
  Filled 2022-07-30: qty 1

## 2022-07-30 MED ORDER — RHO D IMMUNE GLOBULIN 1500 UNIT/2ML IJ SOSY
300.0000 ug | PREFILLED_SYRINGE | Freq: Once | INTRAMUSCULAR | Status: AC
  Administered 2022-07-30: 300 ug via INTRAMUSCULAR
  Filled 2022-07-30: qty 2

## 2022-07-30 MED ORDER — SODIUM CHLORIDE 0.9 % IV SOLN: Freq: Once | INTRAVENOUS | Status: AC

## 2022-07-30 MED ORDER — SODIUM CHLORIDE 0.9 % IV SOLN
500.0000 mg | Freq: Once | INTRAVENOUS | Status: AC
  Administered 2022-07-30: 500 mg via INTRAVENOUS
  Filled 2022-07-30: qty 25

## 2022-07-30 MED ORDER — LACTATED RINGERS IV BOLUS
1000.0000 mL | Freq: Once | INTRAVENOUS | Status: AC
  Administered 2022-07-30: 1000 mL via INTRAVENOUS

## 2022-07-30 MED ORDER — OXYCODONE-ACETAMINOPHEN 5-325 MG PO TABS
2.0000 | ORAL_TABLET | Freq: Once | ORAL | Status: AC
  Administered 2022-07-30: 2 via ORAL
  Filled 2022-07-30: qty 2

## 2022-07-30 NOTE — MAU Provider Note (Cosign Needed)
History     CSN: [id number]  Arrival date and time: 07/30/22 1600   Event Date/Time   First Provider Initiated Contact with Patient 07/30/22 1730      Chief Complaint  Patient presents with   Vaginal Bleeding   Abdominal Pain   Possible Pregnancy   HPI Morgan Quinn is a 36 y.o. [id number]. She presents to MAU with chief complaints of abdominal pain and heavy vaginal bleeding 2/2 concern for miscarriage. Patient states she was [redacted] weeks pregnant when she miscarried at home on 07/24/2022. She endorses 10/10 lower abdominal pain. She has attempted management with 800 mg Ibuprofen q 8 hours but has not experienced relief.  At about 1930, when CNM returned to bedside to discuss initial management plan s/p consult with Dr. Tenny Craw. Patient then mentioned that she had self-administered 400 mcg of Cytotec on Tuesday 07/26/2022. Patient states that by then she was already passing clots and the Cytotec had not expired so she placed it buccally.  Patient receives care with [facility]. She was last seen by then March 2023 for previous miscarriage.  OB History     Gravida  8   Para  5   Term  5   Preterm  0   AB  2   Living  5      SAB  2   IAB  0   Ectopic  0   Multiple  0   Live Births  5           Past Medical History:  Diagnosis Date   Anemia    Anxiety    Cervical dysplasia    Depression    pp for 3 mos; was on medication   Genital warts    h/o cryo    Menorrhagia    Panic attacks    Vaginal Pap smear, abnormal     Past Surgical History:  Procedure Laterality Date   CERVICAL BIOPSY  W/ LOOP ELECTRODE EXCISION     DENTAL SURGERY  July 2014, September 2013   wisdom tooth extraction; other oral surgery   DILATION AND CURETTAGE OF UTERUS N/A 04/29/2021   Procedure: DILATATION AND EVACUATION UNDER ULTRASOUND GUIDANCE;  Surgeon: Rande Brunt, MD;  Location: [facility] OR;  Service: Gynecology;  Laterality: N/A;    Family History  Problem Relation Age  of Onset   Healthy Mother    Colon cancer Father 1       died at age 40    Prader-Willi syndrome Sister    Diabetes Paternal Grandmother     Social History   Tobacco Use   Smoking status: Never   Smokeless tobacco: Never  Vaping Use   Vaping Use: Never used  Substance Use Topics   Alcohol use: Not Currently   Drug use: No    Allergies: No Known Allergies  Medications Prior to Admission  Medication Sig Dispense Refill Last Dose   acetaminophen (TYLENOL) 500 MG tablet Take 1,000 mg by mouth every 6 (six) hours as needed.   07/29/2022 at 1630   ibuprofen (ADVIL) 800 MG tablet Take 1 tablet (800 mg total) by mouth every 8 (eight) hours as needed. 30 tablet 0 07/30/2022 at 0500   cyclobenzaprine (FLEXERIL) 10 MG tablet Take 1 tablet (10 mg total) by mouth at bedtime as needed for muscle spasms. 10 tablet 0    misoprostol (CYTOTEC) 200 MCG tablet Take 4 tablets (800 mcg total) by mouth once for 1 dose. 4 tablet 0  naproxen (NAPROSYN) 500 MG tablet Take 1 tablet (500 mg total) by mouth 2 (two) times daily as needed. 20 tablet 0     Review of Systems  Gastrointestinal:  Positive for abdominal pain.  Genitourinary:  Positive for vaginal bleeding.  All other systems reviewed and are negative.  Physical Exam   Blood pressure 113/79, pulse 100, temperature 98.7 F (37.1 C), temperature source Oral, resp. rate 20, height 5\' 4"  (1.626 m), weight 80.5 kg, last menstrual period 07/07/2022, SpO2 99 %, unknown if currently breastfeeding.  Physical Exam Vitals and nursing note reviewed. Exam conducted with a chaperone present.  Constitutional:      Appearance: She is well-developed. She is not ill-appearing.  Cardiovascular:     Rate and Rhythm: Normal rate and regular rhythm.     Heart sounds: Normal heart sounds.  Pulmonary:     Effort: Pulmonary effort is normal.     Breath sounds: Normal breath sounds.  Abdominal:     Palpations: Abdomen is soft.     Tenderness: There is no  abdominal tenderness.  Genitourinary:    Comments: Large clot dislodged with insertion of speculum. Presentation not consistent with gestational sac. Scant bleeding following removal of clot. Skin:    Capillary Refill: Capillary refill takes less than 2 seconds.  Neurological:     Mental Status: She is alert and oriented to person, place, and time.  Psychiatric:        Mood and Affect: Mood normal.        Behavior: Behavior normal.     MAU Course  Procedures  MDM --1930: Dr. Harrington Challenger contacted. ROS, results and imaging reviewed. Per Dr. Harrington Challenger patient to receive Cytotec 800 mcg as well as IV Iron.  CNM returned to bedside to discuss Dr. Harrington Challenger' orders. Patient then stated she self-administered 400 mcg of Cytotec on Tuesday 07/26/2022. Patient is agreeable to IV Iron but declines Cytotec.  --1942: Dr Harrington Challenger contacted again. Given update on patient's declination of Cytotec. Updated instructions received to complete iron infusion, reassess bleeding and re-collect CBC at end of infusion or earlier if indicated by change in patient acuity.  --1945: CNM returned to bedside, shared updated instructions from Dr. Harrington Challenger. Peripad inspected, scant blood on pad, lower abdomen non-tender, no clots dislodged with abdominal massage   Patient Vitals for the past 24 hrs:  BP Temp Temp src Pulse Resp SpO2 Height Weight  07/30/22 1841 122/80 -- -- 92 -- -- -- --  07/30/22 1645 113/79 98.7 F (37.1 C) Oral 100 20 99 % 5\' 4"  (1.626 m) 80.5 kg   Results for orders placed or performed during the hospital encounter of 07/30/22 (from the past 24 hour(s))  Pregnancy, urine POC     Status: Abnormal   Collection Time: 07/30/22  5:01 PM  Result Value Ref Range   Preg Test, Ur POSITIVE (A) NEGATIVE  Type and screen [place]     Status: None (Preliminary result)   Collection Time: 07/30/22  5:25 PM  Result Value Ref Range   ABO/RH(D) O NEG    Antibody Screen POS    Sample Expiration 08/02/2022,2359     Antibody Identification ANTI D    Unit Number [id number]    Blood Component Type RED CELLS,LR    Unit division 00    Status of Unit ALLOCATED    Transfusion Status OK TO TRANSFUSE    Crossmatch Result COMPATIBLE    Unit Number [id number]    Blood Component Type RBC  LR PHER2    Unit division 00    Status of Unit ALLOCATED    Transfusion Status OK TO TRANSFUSE    Crossmatch Result COMPATIBLE   Basic metabolic panel     Status: Abnormal   Collection Time: 07/30/22  5:32 PM  Result Value Ref Range   Sodium 136 135 - 145 mmol/L   Potassium 3.2 (L) 3.5 - 5.1 mmol/L   Chloride 103 98 - 111 mmol/L   CO2 21 (L) 22 - 32 mmol/L   Glucose, Bld 86 70 - 99 mg/dL   BUN 7 6 - 20 mg/dL   Creatinine, Ser 0.74 0.44 - 1.00 mg/dL   Calcium 8.7 (L) 8.9 - 10.3 mg/dL   GFR, Estimated >60 >60 mL/min   Anion gap 12 5 - 15  CBC with Differential/Platelet     Status: Abnormal   Collection Time: 07/30/22  5:32 PM  Result Value Ref Range   WBC 9.5 4.0 - 10.5 K/uL   RBC 3.07 (L) 3.87 - 5.11 MIL/uL   Hemoglobin 7.8 (L) 12.0 - 15.0 g/dL   HCT 25.9 (L) 36.0 - 46.0 %   MCV 84.4 80.0 - 100.0 fL   MCH 25.4 (L) 26.0 - 34.0 pg   MCHC 30.1 30.0 - 36.0 g/dL   RDW 15.8 (H) 11.5 - 15.5 %   Platelets 357 150 - 400 K/uL   nRBC 0.0 0.0 - 0.2 %   Neutrophils Relative % 74 %   Neutro Abs 7.0 1.7 - 7.7 K/uL   Lymphocytes Relative 18 %   Lymphs Abs 1.7 0.7 - 4.0 K/uL   Monocytes Relative 7 %   Monocytes Absolute 0.7 0.1 - 1.0 K/uL   Eosinophils Relative 1 %   Eosinophils Absolute 0.1 0.0 - 0.5 K/uL   Basophils Relative 0 %   Basophils Absolute 0.0 0.0 - 0.1 K/uL   Immature Granulocytes 0 %   Abs Immature Granulocytes 0.02 0.00 - 0.07 K/uL   US OB Transvaginal  Result Date: 07/30/2022 CLINICAL DATA:  Miscarriage end of september. Positive pregnancy test. Vaginal bleeding. EXAM: TRANSVAGINAL OB ULTRASOUND TECHNIQUE: Transvaginal ultrasound was performed for complete evaluation of the gestation as well as  the maternal uterus, adnexal regions, and pelvic cul-de-sac. COMPARISON:  None Available. FINDINGS: Intrauterine gestational sac: None identified Maternal uterus/adnexae: The uterus is anteverted. The cervix is closed and is unremarkable. The endometrium is thickened measuring up to 30 mm in thickness. Additionally, there is a more echogenic slightly lobulated mass measuring 2.4 x 2.4 x 3.0 cm suspicious for retained products of conception. A small amount of adjacent fluid is seen within the endometrial cavity. Moderate simple appearing free fluid is seen within the cul-de-sac. The maternal ovaries are unremarkable. IMPRESSION: Markedly thickened endometrium with superimposed 3 cm echogenic lobulated mass suspicious for retained products of conception. Moderate free fluid within the pelvis. Electronically Signed   By: Fidela Salisbury M.D.   On: 07/30/2022 19:25    Report given to Milinda Cave, CNM who assumes care of patient at this time.  Mallie Snooks, MSA, MSN, CNM Certified Nurse Midwife, [facility] 07/30/22 8:21 PM   Assessment and Plan  Reassessment (10:47 PM) -Patient is tearful stating that she wants surgery. -Provider actively listens and provides comfort.  Informed that surgery is evaluated and performed by surgeon, but provider we will monitor and provide primary OB with feedback regarding appropriateness of surgery. -Cautioned that but she may be leaving today without surgical intervention. -Patient reports pain relieved with  Percocet dosing. -Patient continues to decline Cytotec dosing. -Patient requesting given apple juice and ice chips. -Venofer infusing -Monitor and reassess as appropriate.  Reassessment (1:50 AM)  -Patient resting in bed. -Infusion almost complete. -Patient reports dizziness with standing but has greatly improved -No needs at this time. -Once infusion complete we will give LR bolus and repeat hemoglobin.  Reassessment (4:14 AM) -Iron infusion  completed and CBC redrawn. -CBC currently 6.8 -Patient reports bleeding remains consistent and dizziness improved. -However, patient continues to express desire for [facility].  -Reassured that despite low hemoglobin she is stable and that symptoms have improved. -However, will express concern and desire to Dr. Harrington Challenger.  -Dr. Mignon Pine called and informed of patient status, evaluation, interventions, and results. Advised: *Patient stable and no need for emergent interventions. *Patient to follow up in office. *Inform patient that office will contact her on Monday.  -Patient updated on POC and expresses some frustration with prolonged and unknown duration of process, but is agreeable. -Instructed to contact office, tomorrow afternoon, if no call received in the AM. -Patient requests and given tylenol for abdominal pain. Informed and prescription for percocet sent to pharmacy on file.  -Patient reports relief with ibuprofen dosing for HA. -Precautions reviewed. -Encouraged to call primary office or return to MAU if symptoms worsen or with the onset of new symptoms. -Discharged to home in stable condition.  Maryann Conners MSN, CNM Advanced Practice Provider, Center for [facility]

## 2022-07-30 NOTE — MAU Note (Signed)
Morgan Quinn is a 36 y.o.  here in MAU reporting: +HPT  9/26. had miscarriage last Jan 29, 2023.  Thought she had passed everything. Was going to come in, but her kids have all been sick.  The bleeding has continued, 2 large pad every hour all wk.  Has been passing some large blood clots during the week.  Has been hurting, cramping really bad when passing clots.  Taking Tylenol and Ibuprofen.  Not helping.   Thought she was 7 wks, had not been seen anywhere yet. Nauseated, can't eat because she feels so bad LMP: 8/25 Onset of complaint: last Thursday Pain score: 10 Vitals:   07/30/22 1645  BP: 113/79  Pulse: 100  Resp: 20  Temp: 98.7 F (37.1 C)  SpO2: 99%      Lab orders placed from triage:  UPT

## 2022-07-31 DIAGNOSIS — O039 Complete or unspecified spontaneous abortion without complication: Secondary | ICD-10-CM

## 2022-07-31 LAB — CBC
HCT: 20.9 % — ABNORMAL LOW (ref 36.0–46.0)
Hemoglobin: 6.6 g/dL — CL (ref 12.0–15.0)
MCH: 26 pg (ref 26.0–34.0)
MCHC: 31.6 g/dL (ref 30.0–36.0)
MCV: 82.3 fL (ref 80.0–100.0)
Platelets: 299 10*3/uL (ref 150–400)
RBC: 2.54 MIL/uL — ABNORMAL LOW (ref 3.87–5.11)
RDW: 15.5 % (ref 11.5–15.5)
WBC: 10 10*3/uL (ref 4.0–10.5)
nRBC: 0.2 % (ref 0.0–0.2)

## 2022-07-31 LAB — RH IG WORKUP (INCLUDES ABO/RH)
Gestational Age(Wks): 7
Unit division: 0

## 2022-07-31 MED ORDER — IBUPROFEN 800 MG PO TABS
800.0000 mg | ORAL_TABLET | Freq: Once | ORAL | Status: AC
  Administered 2022-07-31: 800 mg via ORAL
  Filled 2022-07-31: qty 1

## 2022-07-31 MED ORDER — ACETAMINOPHEN 500 MG PO TABS
1000.0000 mg | ORAL_TABLET | Freq: Once | ORAL | Status: AC
  Administered 2022-07-31: 1000 mg via ORAL
  Filled 2022-07-31: qty 2

## 2022-07-31 MED ORDER — OXYCODONE-ACETAMINOPHEN 5-325 MG PO TABS: 1.0000 | ORAL_TABLET | Freq: Four times a day (QID) | ORAL | 0 refills | Status: DC | PRN

## 2022-07-31 NOTE — MAU Note (Signed)
CRITICAL VALUE STICKER  CRITICAL VALUE: Hgb 6.6  RECEIVER (on-site recipient of call): Donzetta Sprung, RN   [place] NOTIFIED: 07/31/2022 0355  MESSENGER (representative from lab): Lab   CNM NOTIFIED: Gavin Pound, CNM   TIME OF NOTIFICATION: [phone]  RESPONSE: No new orders at this time

## 2022-08-01 ENCOUNTER — Encounter (HOSPITAL_COMMUNITY): Payer: Self-pay | Admitting: Obstetrics and Gynecology

## 2022-08-01 ENCOUNTER — Other Ambulatory Visit: Payer: Self-pay

## 2022-08-01 ENCOUNTER — Inpatient Hospital Stay (HOSPITAL_COMMUNITY)
Admission: AD | Admit: 2022-08-01 | Discharge: 2022-08-05 | DRG: 770 | Disposition: A | Discharge status: Home / Self Care | Payer: Medicaid Other | Attending: Obstetrics & Gynecology | Admitting: Obstetrics & Gynecology

## 2022-08-01 DIAGNOSIS — D62 Acute posthemorrhagic anemia: Secondary | ICD-10-CM | POA: Diagnosis present

## 2022-08-01 DIAGNOSIS — O021 Missed abortion: Secondary | ICD-10-CM | POA: Diagnosis present

## 2022-08-01 DIAGNOSIS — D500 Iron deficiency anemia secondary to blood loss (chronic): Secondary | ICD-10-CM

## 2022-08-01 DIAGNOSIS — D649 Anemia, unspecified: Secondary | ICD-10-CM | POA: Diagnosis present

## 2022-08-01 DIAGNOSIS — O034 Incomplete spontaneous abortion without complication: Secondary | ICD-10-CM | POA: Diagnosis present

## 2022-08-01 DIAGNOSIS — O209 Hemorrhage in early pregnancy, unspecified: Secondary | ICD-10-CM | POA: Diagnosis present

## 2022-08-01 DIAGNOSIS — O039 Complete or unspecified spontaneous abortion without complication: Secondary | ICD-10-CM | POA: Diagnosis not present

## 2022-08-01 LAB — CBC WITH DIFFERENTIAL/PLATELET
Abs Immature Granulocytes: 0.07 10*3/uL (ref 0.00–0.07)
Basophils Absolute: 0 10*3/uL (ref 0.0–0.1)
Basophils Relative: 0 %
Eosinophils Absolute: 0.1 10*3/uL (ref 0.0–0.5)
Eosinophils Relative: 1 %
HCT: 21.7 % — ABNORMAL LOW (ref 36.0–46.0)
Hemoglobin: 6.9 g/dL — CL (ref 12.0–15.0)
Immature Granulocytes: 1 %
Lymphocytes Relative: 20 %
Lymphs Abs: 2.1 10*3/uL (ref 0.7–4.0)
MCH: 26.3 pg (ref 26.0–34.0)
MCHC: 31.8 g/dL (ref 30.0–36.0)
MCV: 82.8 fL (ref 80.0–100.0)
Monocytes Absolute: 1.1 10*3/uL — ABNORMAL HIGH (ref 0.1–1.0)
Monocytes Relative: 10 %
Neutro Abs: 7.2 10*3/uL (ref 1.7–7.7)
Neutrophils Relative %: 68 %
Platelets: 355 10*3/uL (ref 150–400)
RBC: 2.62 MIL/uL — ABNORMAL LOW (ref 3.87–5.11)
RDW: 15.8 % — ABNORMAL HIGH (ref 11.5–15.5)
WBC: 10.6 10*3/uL — ABNORMAL HIGH (ref 4.0–10.5)
nRBC: 0.2 % (ref 0.0–0.2)

## 2022-08-01 LAB — TYPE AND SCREEN
ABO/RH(D): O NEG
Antibody Screen: POSITIVE
Unit division: 0
Unit division: 0

## 2022-08-01 LAB — BPAM RBC
Blood Product Expiration Date: 202310282359
Blood Product Expiration Date: 202310292359
Unit Type and Rh: 9500
Unit Type and Rh: 9500

## 2022-08-01 LAB — HCG, QUANTITATIVE, PREGNANCY: hCG, Beta Chain, Quant, S: 5967 m[IU]/mL — ABNORMAL HIGH (ref <5)

## 2022-08-01 LAB — PREPARE RBC (CROSSMATCH)

## 2022-08-01 MED ORDER — SIMETHICONE 80 MG PO CHEW: 80.0000 mg | CHEWABLE_TABLET | Freq: Four times a day (QID) | ORAL | Status: DC | PRN

## 2022-08-01 MED ORDER — DIPHENHYDRAMINE HCL 25 MG PO CAPS
25.0000 mg | ORAL_CAPSULE | Freq: Once | ORAL | Status: AC
  Administered 2022-08-01: 25 mg via ORAL
  Filled 2022-08-01: qty 1

## 2022-08-01 MED ORDER — ACETAMINOPHEN 325 MG PO TABS
650.0000 mg | ORAL_TABLET | Freq: Once | ORAL | Status: AC
  Administered 2022-08-01: 650 mg via ORAL
  Filled 2022-08-01: qty 2

## 2022-08-01 MED ORDER — LACTATED RINGERS IV BOLUS
1000.0000 mL | Freq: Once | INTRAVENOUS | Status: AC
  Administered 2022-08-01: 1000 mL via INTRAVENOUS

## 2022-08-01 MED ORDER — OXYCODONE-ACETAMINOPHEN 5-325 MG PO TABS
1.0000 | ORAL_TABLET | ORAL | Status: DC | PRN
  Administered 2022-08-02 – 2022-08-03 (×5): 1 via ORAL
  Filled 2022-08-01 (×5): qty 1

## 2022-08-01 MED ORDER — HYDROMORPHONE HCL 1 MG/ML IJ SOLN
1.0000 mg | Freq: Once | INTRAMUSCULAR | Status: AC
  Administered 2022-08-01: 1 mg via INTRAVENOUS
  Filled 2022-08-01: qty 1

## 2022-08-01 MED ORDER — ZOLPIDEM TARTRATE 5 MG PO TABS
5.0000 mg | ORAL_TABLET | Freq: Every evening | ORAL | Status: DC | PRN
  Administered 2022-08-01 – 2022-08-03 (×3): 5 mg via ORAL
  Filled 2022-08-01 (×3): qty 1

## 2022-08-01 MED ORDER — DEXTROSE IN LACTATED RINGERS 5 % IV SOLN: INTRAVENOUS | Status: DC

## 2022-08-01 MED ORDER — SODIUM CHLORIDE 0.9 % IV SOLN
100.0000 mg | Freq: Two times a day (BID) | INTRAVENOUS | Status: DC
  Administered 2022-08-01 – 2022-08-03 (×4): 100 mg via INTRAVENOUS
  Filled 2022-08-01 (×5): qty 100

## 2022-08-01 MED ORDER — ONDANSETRON HCL 4 MG/2ML IJ SOLN
4.0000 mg | Freq: Four times a day (QID) | INTRAMUSCULAR | Status: DC | PRN
  Administered 2022-08-02: 4 mg via INTRAVENOUS
  Filled 2022-08-01 (×2): qty 2

## 2022-08-01 MED ORDER — HYDROMORPHONE HCL 1 MG/ML IJ SOLN
1.0000 mg | INTRAMUSCULAR | Status: DC | PRN
  Administered 2022-08-01 – 2022-08-02 (×5): 1 mg via INTRAVENOUS
  Filled 2022-08-01 (×5): qty 1

## 2022-08-01 MED ORDER — SODIUM CHLORIDE 0.9% IV SOLUTION: Freq: Once | INTRAVENOUS | Status: AC

## 2022-08-01 MED ORDER — ONDANSETRON HCL 4 MG PO TABS
4.0000 mg | ORAL_TABLET | Freq: Four times a day (QID) | ORAL | Status: DC | PRN
  Administered 2022-08-04: 4 mg via ORAL
  Filled 2022-08-01: qty 1

## 2022-08-01 NOTE — MAU Note (Signed)
.  CRITICAL VALUE STICKER  CRITICAL VALUE: 6.9 Hgb  RECEIVER (on-site recipient of call): Dianna Limbo, RN  DATE & TIME NOTIFIED: 1943  MESSENGER (representative from lab): Sherlon Handing   Provider NOTIFIED: Len Blalock, CNM   TIME OF NOTIFICATION: 1944  RESPONSE: No new orders at this time

## 2022-08-01 NOTE — MAU Note (Signed)
Morgan Quinn is a 36 y.o. at [redacted]w[redacted]d here in MAU reporting: continuing to cramp and bleed.  Has the worst HA and is in so much pain.  Feeling so much pressure, has been passing clots, but feels like there is still more to come out.  Called [facility] they could not see her today, wanted her to f/u with her dr tomorrow to see what is going on.  So they are no longer her dr.  Earlean Polka of [facility] last night. Changing at least once an hour.   Onset of complaint: ongoing Pain score: 10 Vitals:   08/01/22 1810  BP: 126/87  Pulse: (!) 126  Resp: 20  Temp: 99.2 F (37.3 C)  SpO2: 100%      Lab orders placed from triage:

## 2022-08-01 NOTE — H&P (Addendum)
History     CSN: [id number]  Arrival date and time: 08/01/22 1738   Event Date/Time   First Provider Initiated Contact with Patient 08/01/22 1826      Chief Complaint  Patient presents with   Vaginal Bleeding   Abdominal Pain   HPI  Morgan Quinn is a 36 y.o. [id number] at [redacted]w[redacted]d who presents for evaluation of abdominal pain and vaginal bleeding. Patient reports since she was seen on 10/21-10/22 in MAU, she has continued to have a moderate amount of bright red bleeding and abdominal pain. She has not had a large episode of bleeding with clots. Patient rates the pain as a 10/10 and has tried tylenol for the pain with no relief. She states she called the office today for an appointment as she was instructed and was told that she could not be seen until tomorrow. She reports she is no longer a patient of that practice and came back to MAU. She reports she is dizzy and weak. She is very frustrated with the way this has been handled.    OB History     Gravida  8   Para  5   Term  5   Preterm  0   AB  2   Living  5      SAB  2   IAB  0   Ectopic  0   Multiple  0   Live Births  5           Past Medical History:  Diagnosis Date   Anemia    Anxiety    Cervical dysplasia    Depression    pp for 3 mos; was on medication   Genital warts    h/o cryo    Menorrhagia    Panic attacks    Vaginal Pap smear, abnormal     Past Surgical History:  Procedure Laterality Date   CERVICAL BIOPSY  W/ LOOP ELECTRODE EXCISION     DENTAL SURGERY  July 2014, September 2013   wisdom tooth extraction; other oral surgery   DILATION AND CURETTAGE OF UTERUS N/A 04/29/2021   Procedure: DILATATION AND EVACUATION UNDER ULTRASOUND GUIDANCE;  Surgeon: Jonelle Sidle, MD;  Location: [facility] OR;  Service: Gynecology;  Laterality: N/A;    Family History  Problem Relation Age of Onset   Healthy Mother    Colon cancer Father 6       died at age 38    Prader-Willi syndrome Sister     Diabetes Paternal Grandmother     Social History   Tobacco Use   Smoking status: Never   Smokeless tobacco: Never  Vaping Use   Vaping Use: Never used  Substance Use Topics   Alcohol use: Not Currently   Drug use: No    Allergies: No Known Allergies  Medications Prior to Admission  Medication Sig Dispense Refill Last Dose   acetaminophen (TYLENOL) 500 MG tablet Take 1,000 mg by mouth every 6 (six) hours as needed.   Unknown   ibuprofen (ADVIL) 800 MG tablet Take 1 tablet (800 mg total) by mouth every 8 (eight) hours as needed. 30 tablet 0 Unknown   oxyCODONE-acetaminophen (PERCOCET/ROXICET) 5-325 MG tablet Take 1-2 tablets by mouth every 6 (six) hours as needed. 10 tablet 0 Unknown    Review of Systems  Constitutional: Negative.  Negative for fatigue and fever.  HENT: Negative.    Respiratory: Negative.  Negative for shortness of breath.   Cardiovascular: Negative.  Negative for chest pain.  Gastrointestinal:  Positive for abdominal pain. Negative for constipation, diarrhea, nausea and vomiting.  Genitourinary:  Positive for vaginal bleeding. Negative for dysuria.  Neurological: Negative.  Negative for dizziness and headaches.   Physical Exam   Blood pressure 127/74, pulse 98, temperature 99.2 F (37.3 C), temperature source Oral, resp. rate 20, height 5\' 4"  (1.626 m), weight 80.4 kg, last menstrual period 07/07/2022, SpO2 100 %, unknown if currently breastfeeding.  Patient Vitals for the past 24 hrs:  BP Temp Temp src Pulse Resp SpO2 Height Weight  08/01/22 1930 127/74 -- -- 98 -- 100 % -- --  08/01/22 1920 -- -- -- -- -- 100 % -- --  08/01/22 1916 108/71 -- -- (!) 101 -- -- -- --  08/01/22 1826 109/85 -- -- (!) 116 -- -- -- --  08/01/22 1810 126/87 99.2 F (37.3 C) Oral (!) 126 20 100 % 5\' 4"  (1.626 m) 80.4 kg   Orthostatic VS for the past 24 hrs:  BP- Lying Pulse- Lying BP- Sitting Pulse- Sitting BP- Standing at 0 minutes Pulse- Standing at 0 minutes  08/01/22  1952 126/75 94 138/77 119 122/64 124      Physical Exam Vitals and nursing note reviewed.  Constitutional:      General: She is not in acute distress.    Appearance: She is well-developed.  HENT:     Head: Normocephalic.  Eyes:     Pupils: Pupils are equal, round, and reactive to light.  Cardiovascular:     Rate and Rhythm: Normal rate and regular rhythm.     Heart sounds: Normal heart sounds.  Pulmonary:     Effort: Pulmonary effort is normal. No respiratory distress.     Breath sounds: Normal breath sounds.  Abdominal:     General: Bowel sounds are normal. There is no distension.     Palpations: Abdomen is soft.     Tenderness: There is no abdominal tenderness.  Genitourinary:    Vagina: Bleeding present.     Comments: SSE: moderate amount of bright red bleeding with 2 small clots, cervix dilated 1cm Skin:    General: Skin is warm and dry.  Neurological:     Mental Status: She is alert and oriented to person, place, and time.  Psychiatric:        Mood and Affect: Mood normal.        Behavior: Behavior normal.        Thought Content: Thought content normal.        Judgment: Judgment normal.    MAU Course  Procedures  Results for orders placed or performed during the hospital encounter of 08/01/22 (from the past 24 hour(s))  CBC with Differential/Platelet     Status: Abnormal   Collection Time: 08/01/22  6:36 PM  Result Value Ref Range   WBC 10.6 (H) 4.0 - 10.5 K/uL   RBC 2.62 (L) 3.87 - 5.11 MIL/uL   Hemoglobin 6.9 (LL) 12.0 - 15.0 g/dL   HCT 08/03/22 (L) 08/03/22 - 67.2 %   MCV 82.8 80.0 - 100.0 fL   MCH 26.3 26.0 - 34.0 pg   MCHC 31.8 30.0 - 36.0 g/dL   RDW 09.4 (H) 70.9 - 62.8 %   Platelets 355 150 - 400 K/uL   nRBC 0.2 0.0 - 0.2 %   Neutrophils Relative % 68 %   Neutro Abs 7.2 1.7 - 7.7 K/uL   Lymphocytes Relative 20 %   Lymphs Abs 2.1 0.7 -  4.0 K/uL   Monocytes Relative 10 %   Monocytes Absolute 1.1 (H) 0.1 - 1.0 K/uL   Eosinophils Relative 1 %   Eosinophils  Absolute 0.1 0.0 - 0.5 K/uL   Basophils Relative 0 %   Basophils Absolute 0.0 0.0 - 0.1 K/uL   Immature Granulocytes 1 %   Abs Immature Granulocytes 0.07 0.00 - 0.07 K/uL  hCG, quantitative, pregnancy     Status: Abnormal   Collection Time: 08/01/22  6:36 PM  Result Value Ref Range   hCG, Beta Chain, Quant, S 5,967 (H) <5 mIU/mL  Type and screen [facility]     Status: None (Preliminary result)   Collection Time: 08/01/22  6:45 PM  Result Value Ref Range   ABO/RH(D) PENDING    Antibody Screen PENDING    Sample Expiration      08/04/2022,2359 Performed at [facility], [address], [place], [place] [address]    MDM Labs ordered and reviewed.   CBC, HCG, Type and Screen LR bolus Dilaudid IV Orthostatic vital signs  CNM consulted with Dr. Alysia Penna regarding presentation and results- MD recommends admission to [facility] for blood transfusion and schedule for surgery tomorrow  Assessment and Plan   1. Retained products of conception after miscarriage   2. Symptomatic anemia   3. Blood loss anemia    -Admit to [facility] -Care turned over to MD  Rolm Bookbinder, CNM 08/01/2022, 6:26 PM   OB Attending.  Pt seen and examined Will admit for 23 observation, blood transfusion and suction D & C tomorrow. Indications for blood transfusion reviewed with pt. Pt gave consent for transfusion. NPO after midnight and surgery tomorrow.  Pt agrees to plan of care.  Nettie Elm, MD

## 2022-08-02 ENCOUNTER — Encounter (HOSPITAL_COMMUNITY): Payer: Self-pay | Admitting: Obstetrics and Gynecology

## 2022-08-02 ENCOUNTER — Other Ambulatory Visit: Payer: Self-pay

## 2022-08-02 ENCOUNTER — Encounter (HOSPITAL_COMMUNITY)
Admission: AD | Disposition: A | Discharge status: Home / Self Care | Payer: Self-pay | Source: Home / Self Care | Attending: Obstetrics & Gynecology

## 2022-08-02 ENCOUNTER — Inpatient Hospital Stay (HOSPITAL_COMMUNITY): Payer: Medicaid Other | Admitting: Anesthesiology

## 2022-08-02 DIAGNOSIS — D649 Anemia, unspecified: Secondary | ICD-10-CM

## 2022-08-02 DIAGNOSIS — O034 Incomplete spontaneous abortion without complication: Secondary | ICD-10-CM

## 2022-08-02 HISTORY — PX: DILATION AND EVACUATION: SHX1459

## 2022-08-02 LAB — HEMOGLOBIN AND HEMATOCRIT, BLOOD
HCT: 26.9 % — ABNORMAL LOW (ref 36.0–46.0)
Hemoglobin: 9.3 g/dL — ABNORMAL LOW (ref 12.0–15.0)

## 2022-08-02 SURGERY — DILATION AND EVACUATION, UTERUS
Anesthesia: General | Site: Vagina

## 2022-08-02 MED ORDER — HYDROMORPHONE HCL 1 MG/ML IJ SOLN
1.0000 mg | INTRAMUSCULAR | Status: DC | PRN
  Administered 2022-08-02: 1 mg via INTRAVENOUS
  Administered 2022-08-02 – 2022-08-04 (×5): 2 mg via INTRAVENOUS
  Filled 2022-08-02 (×2): qty 2
  Filled 2022-08-02: qty 1
  Filled 2022-08-02 (×3): qty 2

## 2022-08-02 MED ORDER — LIDOCAINE 2% (20 MG/ML) 5 ML SYRINGE
INTRAMUSCULAR | Status: DC | PRN
  Administered 2022-08-02: 60 mg via INTRAVENOUS

## 2022-08-02 MED ORDER — KETOROLAC TROMETHAMINE 30 MG/ML IJ SOLN
30.0000 mg | Freq: Four times a day (QID) | INTRAMUSCULAR | Status: DC
  Administered 2022-08-02 – 2022-08-03 (×3): 30 mg via INTRAVENOUS
  Filled 2022-08-02 (×3): qty 1

## 2022-08-02 MED ORDER — CEFAZOLIN SODIUM-DEXTROSE 1-4 GM/50ML-% IV SOLN
1.0000 g | Freq: Three times a day (TID) | INTRAVENOUS | Status: AC
  Administered 2022-08-02 – 2022-08-03 (×3): 1 g via INTRAVENOUS
  Filled 2022-08-02 (×3): qty 50

## 2022-08-02 MED ORDER — CARBOPROST TROMETHAMINE 250 MCG/ML IM SOLN
INTRAMUSCULAR | Status: AC
  Filled 2022-08-02: qty 1

## 2022-08-02 MED ORDER — ONDANSETRON HCL 4 MG/2ML IJ SOLN
INTRAMUSCULAR | Status: DC | PRN
  Administered 2022-08-02: 4 mg via INTRAVENOUS

## 2022-08-02 MED ORDER — CHLORHEXIDINE GLUCONATE 0.12 % MT SOLN: 15.0000 mL | Freq: Once | OROMUCOSAL | Status: AC

## 2022-08-02 MED ORDER — BUPIVACAINE HCL 0.5 % IJ SOLN
INTRAMUSCULAR | Status: DC | PRN
  Administered 2022-08-02: 30 mL

## 2022-08-02 MED ORDER — ACETAMINOPHEN 10 MG/ML IV SOLN: 1000.0000 mg | Freq: Once | INTRAVENOUS | Status: DC | PRN

## 2022-08-02 MED ORDER — LACTATED RINGERS IV SOLN: INTRAVENOUS | Status: DC

## 2022-08-02 MED ORDER — FENTANYL CITRATE (PF) 100 MCG/2ML IJ SOLN
25.0000 ug | INTRAMUSCULAR | Status: DC | PRN
  Administered 2022-08-02 (×4): 50 ug via INTRAVENOUS

## 2022-08-02 MED ORDER — PROPOFOL 10 MG/ML IV BOLUS
INTRAVENOUS | Status: AC
  Filled 2022-08-02: qty 20

## 2022-08-02 MED ORDER — DEXAMETHASONE SODIUM PHOSPHATE 10 MG/ML IJ SOLN
INTRAMUSCULAR | Status: DC | PRN
  Administered 2022-08-02: 10 mg via INTRAVENOUS

## 2022-08-02 MED ORDER — PROPOFOL 10 MG/ML IV BOLUS
INTRAVENOUS | Status: DC | PRN
  Administered 2022-08-02: 200 mg via INTRAVENOUS

## 2022-08-02 MED ORDER — CARBOPROST TROMETHAMINE 250 MCG/ML IM SOLN
INTRAMUSCULAR | Status: DC | PRN
  Administered 2022-08-02: 250 ug via INTRAMUSCULAR

## 2022-08-02 MED ORDER — FENTANYL CITRATE (PF) 250 MCG/5ML IJ SOLN
INTRAMUSCULAR | Status: AC
  Filled 2022-08-02: qty 5

## 2022-08-02 MED ORDER — FENTANYL CITRATE (PF) 100 MCG/2ML IJ SOLN
INTRAMUSCULAR | Status: AC
  Filled 2022-08-02: qty 2

## 2022-08-02 MED ORDER — BUPIVACAINE HCL (PF) 0.5 % IJ SOLN
INTRAMUSCULAR | Status: AC
  Filled 2022-08-02: qty 30

## 2022-08-02 MED ORDER — METHYLERGONOVINE MALEATE 0.2 MG/ML IJ SOLN
INTRAMUSCULAR | Status: DC | PRN
  Administered 2022-08-02: .2 mg via INTRAMUSCULAR

## 2022-08-02 MED ORDER — OXYCODONE HCL 5 MG PO TABS
ORAL_TABLET | ORAL | Status: AC
  Filled 2022-08-02: qty 1

## 2022-08-02 MED ORDER — MIDAZOLAM HCL 2 MG/2ML IJ SOLN
INTRAMUSCULAR | Status: DC | PRN
  Administered 2022-08-02: 2 mg via INTRAVENOUS

## 2022-08-02 MED ORDER — ORAL CARE MOUTH RINSE: 15.0000 mL | Freq: Once | OROMUCOSAL | Status: AC

## 2022-08-02 MED ORDER — FENTANYL CITRATE (PF) 250 MCG/5ML IJ SOLN
INTRAMUSCULAR | Status: DC | PRN
  Administered 2022-08-02 (×5): 50 ug via INTRAVENOUS

## 2022-08-02 MED ORDER — METHYLERGONOVINE MALEATE 0.2 MG/ML IJ SOLN
INTRAMUSCULAR | Status: AC
  Filled 2022-08-02: qty 1

## 2022-08-02 MED ORDER — DIPHENHYDRAMINE HCL 25 MG PO CAPS
25.0000 mg | ORAL_CAPSULE | Freq: Once | ORAL | Status: AC
  Administered 2022-08-02: 25 mg via ORAL
  Filled 2022-08-02: qty 1

## 2022-08-02 MED ORDER — OXYCODONE HCL 5 MG PO TABS
5.0000 mg | ORAL_TABLET | Freq: Once | ORAL | Status: AC | PRN
  Administered 2022-08-02: 5 mg via ORAL

## 2022-08-02 MED ORDER — ACETAMINOPHEN 10 MG/ML IV SOLN
INTRAVENOUS | Status: AC
  Filled 2022-08-02: qty 100

## 2022-08-02 MED ORDER — ACETAMINOPHEN 500 MG PO TABS: 1000.0000 mg | ORAL_TABLET | Freq: Once | ORAL | Status: DC | PRN

## 2022-08-02 MED ORDER — ACETAMINOPHEN 10 MG/ML IV SOLN
INTRAVENOUS | Status: DC | PRN
  Administered 2022-08-02: 1000 mg via INTRAVENOUS

## 2022-08-02 MED ORDER — ACETAMINOPHEN 160 MG/5ML PO SOLN: 1000.0000 mg | Freq: Once | ORAL | Status: DC | PRN

## 2022-08-02 MED ORDER — MIDAZOLAM HCL 2 MG/2ML IJ SOLN
INTRAMUSCULAR | Status: AC
  Filled 2022-08-02: qty 2

## 2022-08-02 MED ORDER — CHLORHEXIDINE GLUCONATE 0.12 % MT SOLN
OROMUCOSAL | Status: AC
  Administered 2022-08-02: 15 mL via OROMUCOSAL
  Filled 2022-08-02: qty 15

## 2022-08-02 MED ORDER — KETOROLAC TROMETHAMINE 30 MG/ML IJ SOLN
INTRAMUSCULAR | Status: DC | PRN
  Administered 2022-08-02: 30 mg via INTRAVENOUS

## 2022-08-02 MED ORDER — OXYCODONE HCL 5 MG/5ML PO SOLN: 5.0000 mg | Freq: Once | ORAL | Status: AC | PRN

## 2022-08-02 SURGICAL SUPPLY — 26 items
CATH FOLEY 2WAY SLVR 18FR 30CC (CATHETERS) IMPLANT
CATH ROBINSON RED A/P 16FR (CATHETERS) ×1 IMPLANT
FILTER UTR ASPR ASSEMBLY (MISCELLANEOUS) ×1 IMPLANT
GLOVE BIOGEL PI IND STRL 7.0 (GLOVE) ×1 IMPLANT
GLOVE ECLIPSE 7.0 STRL STRAW (GLOVE) ×1 IMPLANT
GOWN STRL REUS W/ TWL LRG LVL3 (GOWN DISPOSABLE) ×2 IMPLANT
GOWN STRL REUS W/ TWL XL LVL3 (GOWN DISPOSABLE) IMPLANT
GOWN STRL REUS W/TWL LRG LVL3 (GOWN DISPOSABLE) ×1
GOWN STRL REUS W/TWL XL LVL3 (GOWN DISPOSABLE) ×1
HOSE CONNECTING 18IN BERKELEY (TUBING) ×1 IMPLANT
KIT BERKELEY 1ST TRI 3/8 NO TR (MISCELLANEOUS) ×1 IMPLANT
KIT BERKELEY 1ST TRIMESTER 3/8 (MISCELLANEOUS) ×1 IMPLANT
NS IRRIG 1000ML POUR BTL (IV SOLUTION) ×1 IMPLANT
PACK VAGINAL MINOR WOMEN LF (CUSTOM PROCEDURE TRAY) ×1 IMPLANT
PAD OB MATERNITY 4.3X12.25 (PERSONAL CARE ITEMS) ×1 IMPLANT
SET BERKELEY SUCTION TUBING (SUCTIONS) ×1 IMPLANT
SPIKE FLUID TRANSFER (MISCELLANEOUS) ×1 IMPLANT
SYR 50ML LL SCALE MARK (SYRINGE) IMPLANT
TOWEL GREEN STERILE FF (TOWEL DISPOSABLE) ×2 IMPLANT
UNDERPAD 30X36 HEAVY ABSORB (UNDERPADS AND DIAPERS) ×1 IMPLANT
VACURETTE 10 RIGID CVD (CANNULA) IMPLANT
VACURETTE 6 ASPIR F TIP BERK (CANNULA) IMPLANT
VACURETTE 7MM CVD STRL WRAP (CANNULA) IMPLANT
VACURETTE 8 RIGID CVD (CANNULA) IMPLANT
VACURETTE 8MM F TIP (MISCELLANEOUS) IMPLANT
VACURETTE 9 RIGID CVD (CANNULA) IMPLANT

## 2022-08-02 NOTE — Progress Notes (Signed)
Patient arrived to [facility]. Had a discussion with her about surgical events and informed her the intrauterine foley will remain in place until tomorrow morning. All questions answered. Will continue routine postoperative care.   Morgan Schneiders, MD, [place] for [employer] [facility], [place]

## 2022-08-02 NOTE — Op Note (Signed)
Eddie Dibbles PROCEDURE DATE: 08/02/2022  PREOPERATIVE DIAGNOSIS: Retained products of conception after miscarriage associated with bleeding leading to symptomatic anemia POSTOPERATIVE DIAGNOSIS: The same PROCEDURE:     Dilation and Evacuation SURGEON:  Dr. Verita Schneiders  INDICATIONS: 36 y.o. [id number] with retained products of conception after miscarriage associated with bleeding leading to symptomatic anemia, needing surgical completion.  Risks of surgery were discussed with the patient including but not limited to: bleeding which may require transfusion; infection which may require antibiotics; injury to uterus or surrounding organs; need for additional procedures including laparotomy or laparoscopy; possibility of intrauterine scarring which may impair future fertility; and other postoperative/anesthesia complications. Written informed consent was obtained.    FINDINGS: Moderate amounts of products of conception, specimen sent to pathology. Brisk bleeding noted after procedure, Methergine 0.2 mg IM x 1 and Hemabate 250 mcg IM x 1 given.  Intrauterine foley placed and balloon inflated with 60 ml of water.  All these interventions helped ameliorate the bleeding  ANESTHESIA: General-LMA, paracervical block. INTRAVENOUS FLUIDS:  1200 ml of LR, 1 pRBCs ESTIMATED BLOOD LOSS: 800 ml. SPECIMENS:  Products of conception sent to pathology COMPLICATIONS:  Increased bleeding as noted above  PROCEDURE DETAILS:  The patient had received intravenous Doxycycline while on the antepartum unit.  She was then taken to the operating room where general anesthesia was administered and was found to be adequate.  After an adequate timeout was performed, she was placed in the dorsal lithotomy position and examined; then prepped and draped in the sterile manner.   Her bladder was catheterized for an unmeasured amount of clear, yellow urine. A vaginal speculum was then placed in the patient's vagina and a single tooth  tenaculum was applied to the anterior lip of the cervix.  A paracervical block using 30 ml of 0.5% Marcaine was administered. The cervix was noted to be dilated and accommodated 8 mm suction curette that was gently advanced to the uterine fundus. The suction device was then activated and curette slowly rotated to clear the uterus of products of conception.  Suction curettage was done until complete emptying of the uterus was confirmed. There was brisk bleeding noted after procedure, and the aforementioned interventions were done to help with this.   After foley was placed, the tenaculum removed with good hemostasis noted.   All instruments were removed from the patient's vagina.  Sponge and instrument counts were correct times three  The patient tolerated the procedure well and was taken to the recovery area extubated, awake, and in stable condition.  Given her blood loss and intrauterine foley being in place for now, patient will be observed overnight.    Verita Schneiders, MD, [place] for [employer], [place]

## 2022-08-02 NOTE — Progress Notes (Signed)
Faculty Practice OB/GYN Attending Note  Subjective:  Patient still has mild cramping and bleeding but feeling overall better, received 3 PRBCs overnight.    Admitted on 08/01/2022 for Retained products of conception after miscarriage and symptomatic anemia.    Objective:  Blood pressure 107/73, pulse 71, temperature 98.2 F (36.8 C), temperature source Oral, resp. rate 16, height 5\' 4"  (1.626 m), weight 80.4 kg, last menstrual period 07/07/2022, SpO2 99 %, unknown if currently breastfeeding. Gen: NAD HENT: Normocephalic, atraumatic Lungs: Normal respiratory effort Heart: Regular rate noted Abdomen: NT, soft Cervix: Deferred Ext: 2+ DTRs, no edema, no cyanosis, negative Homan's sign     Latest Ref Rng & Units 08/02/2022    8:01 AM 08/01/2022    6:36 PM 07/31/2022    3:07 AM  CBC  WBC 4.0 - 10.5 K/uL  10.6  10.0   Hemoglobin 12.0 - 15.0 g/dL 9.3  6.9  6.6   Hematocrit 36.0 - 46.0 % 26.9  21.7  20.9   Platelets 150 - 400 K/uL  355  299       Latest Ref Rng & Units 07/30/2022    5:32 PM 04/29/2021    3:46 AM 12/14/2018    7:03 PM  CMP  Glucose 70 - 99 mg/dL 86   110   BUN 6 - 20 mg/dL 7   14   Creatinine 0.44 - 1.00 mg/dL 0.74   0.73   Sodium 135 - 145 mmol/L 136  138  135   Potassium 3.5 - 5.1 mmol/L 3.2  4.0  3.9   Chloride 98 - 111 mmol/L 103   105   CO2 22 - 32 mmol/L 21   23   Calcium 8.9 - 10.3 mg/dL 8.7   8.9   Total Protein 6.5 - 8.1 g/dL   7.1   Total Bilirubin 0.3 - 1.2 mg/dL   0.7   Alkaline Phos 38 - 126 U/L   41   AST 15 - 41 U/L   16   ALT 0 - 44 U/L   10    US OB Transvaginal  Result Date: 07/30/2022 CLINICAL DATA:  Miscarriage end of september. Positive pregnancy test. Vaginal bleeding. EXAM: TRANSVAGINAL OB ULTRASOUND TECHNIQUE: Transvaginal ultrasound was performed for complete evaluation of the gestation as well as the maternal uterus, adnexal regions, and pelvic cul-de-sac. COMPARISON:  None Available. FINDINGS: Intrauterine gestational sac: None  identified Maternal uterus/adnexae: The uterus is anteverted. The cervix is closed and is unremarkable. The endometrium is thickened measuring up to 30 mm in thickness. Additionally, there is a more echogenic slightly lobulated mass measuring 2.4 x 2.4 x 3.0 cm suspicious for retained products of conception. A small amount of adjacent fluid is seen within the endometrial cavity. Moderate simple appearing free fluid is seen within the cul-de-sac. The maternal ovaries are unremarkable. IMPRESSION: Markedly thickened endometrium with superimposed 3 cm echogenic lobulated mass suspicious for retained products of conception. Moderate free fluid within the pelvis. Electronically Signed   By: Fidela Salisbury M.D.   On: 07/30/2022 19:25     Assessment & Plan:  36 y.o. [id number] admitted for symptomatic anemia due to bleeding from retained products of conception after miscarriage.  Has received transfusion of 3 pRBCs, hemoglobin increased to 9.3 as expected and her symptoms improved.  Awaiting suction D&C (D&E) later today, still an add-on case and no definite time yet.  Risks of surgery including bleeding, infection, injury to surrounding organs, need for additional procedures, possibility of  intrauterine scarring which may impair future fertility, risk of retained products which may require further management and other postoperative/anesthesia complications were explained to patient.  Likelihood of success of complete evacuation of the uterus was discussed with the patient.  Written informed consent was obtained.  Patient has been NPO since last night  she will remain NPO for procedure. Anesthesia and OR aware.  She is on Doxycycline IV 100 mg q12 hours, this will be sufficient for SCIP.  To OR when ready.       In the meantime, will continue close observation and routine care.   Jaynie Collins, MD, FACOG Obstetrician & Gynecologist, [facility] for [employer], [facility]

## 2022-08-02 NOTE — Anesthesia Preprocedure Evaluation (Signed)
Anesthesia Evaluation  Patient identified by MRN, date of birth, ID band Patient awake    Reviewed: Allergy & Precautions, NPO status , Patient's Chart, lab work & pertinent test results  History of Anesthesia Complications Negative for: history of anesthetic complications  Airway Mallampati: I  TM Distance: >3 FB Neck ROM: Full    Dental  (+) Teeth Intact, Dental Advisory Given   Pulmonary neg pulmonary ROS,    breath sounds clear to auscultation       Cardiovascular negative cardio ROS   Rhythm:Regular     Neuro/Psych PSYCHIATRIC DISORDERS Anxiety Depression negative neurological ROS     GI/Hepatic negative GI ROS,   Endo/Other    Renal/GU negative Renal ROS     Musculoskeletal   Abdominal   Peds  Hematology  (+) Blood dyscrasia, anemia , Lab Results      Component                Value               Date                      WBC                      10.6 (H)            08/01/2022                HGB                      9.3 (L)             08/02/2022                HCT                      26.9 (L)            08/02/2022                MCV                      82.8                08/01/2022                PLT                      355                 08/01/2022              Anesthesia Other Findings   Reproductive/Obstetrics                             Anesthesia Physical Anesthesia Plan  ASA: 2 and emergent  Anesthesia Plan: General   Post-op Pain Management: Ofirmev IV (intra-op)* and Toradol IV (intra-op)*   Induction: Intravenous  PONV Risk Score and Plan: 3 and Ondansetron, Dexamethasone and Midazolam  Airway Management Planned: LMA  Additional Equipment: None  Intra-op Plan:   Post-operative Plan: Extubation in OR  Informed Consent: I have reviewed the patients History and Physical, chart, labs and discussed the procedure including the risks, benefits and  alternatives for the proposed anesthesia with the patient or authorized representative who has indicated his/her understanding and acceptance.  Dental advisory given  Plan Discussed with: CRNA  Anesthesia Plan Comments:         Anesthesia Quick Evaluation

## 2022-08-02 NOTE — Transfer of Care (Signed)
Immediate Anesthesia Transfer of Care Note  Patient: Morgan Quinn  Procedure(s) Performed: SUCTION DILATATION AND EVACUATION, INSERTION OF UTERINE BALLOON (Vagina )  Patient Location: PACU  Anesthesia Type:General  Level of Consciousness: awake and alert   Airway & Oxygen Therapy: Patient Spontanous Breathing  Post-op Assessment: Report given to RN and Post -op Vital signs reviewed and stable  Post vital signs: Reviewed and stable  Last Vitals:  Vitals Value Taken Time  BP 119/85 08/02/22 1500  Temp 36.4 C 08/02/22 1500  Pulse 79 08/02/22 1505  Resp 20 08/02/22 1505  SpO2 100 % 08/02/22 1505  Vitals shown include unvalidated device data.  Last Pain:  Vitals:   08/02/22 1317  TempSrc:   PainSc: 10-Worst pain ever         Complications: No notable events documented.

## 2022-08-03 ENCOUNTER — Encounter (HOSPITAL_COMMUNITY): Payer: Self-pay | Admitting: Obstetrics & Gynecology

## 2022-08-03 LAB — CBC
HCT: 30.4 % — ABNORMAL LOW (ref 36.0–46.0)
Hemoglobin: 10.2 g/dL — ABNORMAL LOW (ref 12.0–15.0)
MCH: 27.7 pg (ref 26.0–34.0)
MCHC: 33.6 g/dL (ref 30.0–36.0)
MCV: 82.6 fL (ref 80.0–100.0)
Platelets: 302 10*3/uL (ref 150–400)
RBC: 3.68 MIL/uL — ABNORMAL LOW (ref 3.87–5.11)
RDW: 14.9 % (ref 11.5–15.5)
WBC: 16.3 10*3/uL — ABNORMAL HIGH (ref 4.0–10.5)
nRBC: 0.1 % (ref 0.0–0.2)

## 2022-08-03 LAB — SURGICAL PATHOLOGY

## 2022-08-03 LAB — PREPARE RBC (CROSSMATCH)

## 2022-08-03 MED ORDER — OXYCODONE HCL 5 MG PO TABS
10.0000 mg | ORAL_TABLET | ORAL | Status: DC | PRN
  Administered 2022-08-03 – 2022-08-04 (×4): 10 mg via ORAL
  Filled 2022-08-03 (×6): qty 2

## 2022-08-03 MED ORDER — FAMOTIDINE 20 MG PO TABS
20.0000 mg | ORAL_TABLET | Freq: Two times a day (BID) | ORAL | Status: DC
  Administered 2022-08-03 – 2022-08-04 (×3): 20 mg via ORAL
  Filled 2022-08-03 (×5): qty 1

## 2022-08-03 MED ORDER — DOXYCYCLINE HYCLATE 100 MG PO TABS
100.0000 mg | ORAL_TABLET | Freq: Two times a day (BID) | ORAL | Status: DC
  Administered 2022-08-03 – 2022-08-05 (×4): 100 mg via ORAL
  Filled 2022-08-03 (×4): qty 1

## 2022-08-03 MED ORDER — ACETAMINOPHEN 500 MG PO TABS
1000.0000 mg | ORAL_TABLET | Freq: Four times a day (QID) | ORAL | Status: DC
  Administered 2022-08-03 – 2022-08-05 (×8): 1000 mg via ORAL
  Filled 2022-08-03 (×10): qty 2

## 2022-08-03 MED ORDER — IBUPROFEN 600 MG PO TABS
600.0000 mg | ORAL_TABLET | Freq: Four times a day (QID) | ORAL | Status: DC
  Administered 2022-08-03 – 2022-08-05 (×8): 600 mg via ORAL
  Filled 2022-08-03 (×9): qty 1

## 2022-08-03 NOTE — Anesthesia Postprocedure Evaluation (Signed)
Anesthesia Post Note  Patient: Morgan Quinn  Procedure(s) Performed: SUCTION DILATATION AND EVACUATION, INSERTION OF UTERINE BALLOON (Vagina )     Patient location during evaluation: PACU Anesthesia Type: General Level of consciousness: awake and alert Pain management: pain level controlled Vital Signs Assessment: post-procedure vital signs reviewed and stable Respiratory status: spontaneous breathing, nonlabored ventilation and respiratory function stable Cardiovascular status: blood pressure returned to baseline and stable Postop Assessment: no apparent nausea or vomiting Anesthetic complications: no   No notable events documented.  Last Vitals:  Vitals:   08/03/22 0433 08/03/22 0743  BP: 97/71 114/75  Pulse: (!) 55 73  Resp: 18   Temp: (!) 36.3 C 36.5 C  SpO2: 100%     Last Pain:  Vitals:   08/03/22 0753  TempSrc:   PainSc: 10-Worst pain ever                 Oluwaseyi Raffel

## 2022-08-03 NOTE — Progress Notes (Signed)
Subjective: Patient reports nausea and vomiting overnight. She states she had minimal sleep due to severe  cramping pain. She denies feeling dizzy, lightheaded  Objective: I have reviewed patient's vital signs and labs. Blood pressure 97/71, pulse (!) 55, temperature (!) 97.4 F (36.3 C), temperature source Oral, resp. rate 18, height 5\' 4"  (1.626 m), weight 80.4 kg, last menstrual period 07/07/2022, SpO2 100 %, unknown if currently breastfeeding.  GENERAL: Well-developed, well-nourished female in no acute distress.  LUNGS: Clear to auscultation bilaterally.  HEART: Regular rate and rhythm. ABDOMEN: Soft, nontender, nondistended. No organomegaly. PELVIC: intrauterine catheter deflated and removed EXTREMITIES: No cyanosis, clubbing, or edema, 2+ distal pulses.    Assessment/Plan: 36 yo s/p D&E with hemorrhage - Patient remained hemodynamically stable overnight - Now that intrauterine catheter has been removed, better pain control should be achieved - Will consider discharge home later today   LOS: 2 days    Mora Bellman, MD 08/03/2022, 7:45 AM

## 2022-08-04 LAB — CBC WITH DIFFERENTIAL/PLATELET
Abs Immature Granulocytes: 0.07 10*3/uL (ref 0.00–0.07)
Basophils Absolute: 0 10*3/uL (ref 0.0–0.1)
Basophils Relative: 0 %
Eosinophils Absolute: 0.1 10*3/uL (ref 0.0–0.5)
Eosinophils Relative: 1 %
HCT: 25.3 % — ABNORMAL LOW (ref 36.0–46.0)
Hemoglobin: 8.3 g/dL — ABNORMAL LOW (ref 12.0–15.0)
Immature Granulocytes: 1 %
Lymphocytes Relative: 35 %
Lymphs Abs: 3 10*3/uL (ref 0.7–4.0)
MCH: 27.6 pg (ref 26.0–34.0)
MCHC: 32.8 g/dL (ref 30.0–36.0)
MCV: 84.1 fL (ref 80.0–100.0)
Monocytes Absolute: 0.4 10*3/uL (ref 0.1–1.0)
Monocytes Relative: 5 %
Neutro Abs: 4.9 10*3/uL (ref 1.7–7.7)
Neutrophils Relative %: 58 %
Platelets: 259 10*3/uL (ref 150–400)
RBC: 3.01 MIL/uL — ABNORMAL LOW (ref 3.87–5.11)
RDW: 15.4 % (ref 11.5–15.5)
WBC: 8.5 10*3/uL (ref 4.0–10.5)
nRBC: 0 % (ref 0.0–0.2)

## 2022-08-04 LAB — COMPREHENSIVE METABOLIC PANEL
ALT: 16 U/L (ref 0–44)
AST: 14 U/L — ABNORMAL LOW (ref 15–41)
Albumin: 2.8 g/dL — ABNORMAL LOW (ref 3.5–5.0)
Alkaline Phosphatase: 51 U/L (ref 38–126)
Anion gap: 7 (ref 5–15)
BUN: 10 mg/dL (ref 6–20)
CO2: 24 mmol/L (ref 22–32)
Calcium: 8 mg/dL — ABNORMAL LOW (ref 8.9–10.3)
Chloride: 108 mmol/L (ref 98–111)
Creatinine, Ser: 0.78 mg/dL (ref 0.44–1.00)
GFR, Estimated: >60 mL/min (ref >60)
Glucose, Bld: 83 mg/dL (ref 70–99)
Potassium: 3 mmol/L — ABNORMAL LOW (ref 3.5–5.1)
Sodium: 139 mmol/L (ref 135–145)
Total Bilirubin: 0.6 mg/dL (ref 0.3–1.2)
Total Protein: 5.5 g/dL — ABNORMAL LOW (ref 6.5–8.1)

## 2022-08-04 MED ORDER — OXYCODONE HCL 5 MG PO TABS
10.0000 mg | ORAL_TABLET | ORAL | Status: DC | PRN
  Administered 2022-08-04: 15 mg via ORAL
  Filled 2022-08-04: qty 3

## 2022-08-04 MED ORDER — LORAZEPAM 1 MG PO TABS
1.0000 mg | ORAL_TABLET | Freq: Four times a day (QID) | ORAL | Status: DC | PRN
  Administered 2022-08-04 (×2): 1 mg via ORAL
  Filled 2022-08-04 (×2): qty 1

## 2022-08-04 MED ORDER — GABAPENTIN 300 MG PO CAPS: 300.0000 mg | ORAL_CAPSULE | Freq: Three times a day (TID) | ORAL | Status: DC

## 2022-08-04 MED ORDER — GABAPENTIN 300 MG PO CAPS
300.0000 mg | ORAL_CAPSULE | Freq: Three times a day (TID) | ORAL | Status: DC
  Administered 2022-08-04 – 2022-08-05 (×4): 300 mg via ORAL
  Filled 2022-08-04 (×4): qty 1

## 2022-08-04 MED ORDER — HYDROMORPHONE HCL 2 MG PO TABS
4.0000 mg | ORAL_TABLET | ORAL | Status: DC | PRN
  Administered 2022-08-04 – 2022-08-05 (×6): 4 mg via ORAL
  Filled 2022-08-04 (×7): qty 2

## 2022-08-04 NOTE — Progress Notes (Signed)
   Faculty Practice OB/GYN Attending Note  Subjective:  Called to evaluate patient with continued lower abdominal pain not controlled on Oxycodone.  Able to tolerate liquids and food, does not report flatus or BM "I am not eating much".  Minimal bleeding reported by patient.     Objective:  Blood pressure 108/66, pulse 64, temperature 98.1 F (36.7 C), temperature source Oral, resp. rate 16, height 5\' 4"  (1.626 m), weight 80.4 kg, last menstrual period 07/07/2022, SpO2 100 %, unknown if currently breastfeeding. Gen: NAD HENT: Normocephalic, atraumatic Lungs: Normal respiratory effort Heart: Regular rate noted Abdomen: soft, +moderate lower pelvic tenderness, +normal bowel sounds diffusely, no rebound or guarding.  Cervix: Deferred Ext: 2+ DTRs, no edema, no cyanosis, negative Homan's sign  Assessment & Plan:  36 y.o. [id number] POD#2 s/p D&E complicated by bleeding. Postoperative course complication by pain management issues, patient is not wanting to go home as she feels she cannot "deal with five kids at home with this pain and will come back".  She is also very anxious. Of note, patient had significant pain prior to procedure too and was being treated with IV Dilaudid. Had a long discussion with patient about pain expectations after recent procedure, did modify pain regimen (Dilaudid orally, in addition to standing Tylenol and Ibuprofen). Declined patient's request for continued IV Dilaudid at this point, want to transition to oral medications.  Added Gabapentin and also Ativan prn anxiety.  Will monitor effect. Will check CBC, CMET. No fevers, vitals stable.  On Doxycycline bid, low suspicion for infection or ileus at this point. Pain is out of realm of what is expected at this point. If this worsens, may need to do imaging studies. Continue close observation and multi-modal approach to pain management. Hoping for discharge to home tomorrow.   Verita Schneiders, MD, [place] for [employer], [place]

## 2022-08-04 NOTE — Progress Notes (Signed)
Subjective: She states she slept well but thinks it caused her to get behind on her pain medicine. Pain is 10/10 per pt. She feels like the pain is a stabbing pain.  She denies feeling dizzy, lightheaded.   Objective: I have reviewed patient's vital signs and labs. Blood pressure 97/71, pulse (!) 55, temperature (!) 97.4 F (36.3 C), temperature source Oral, resp. rate 18, height 5\' 4"  (1.626 m), weight 80.4 kg, last menstrual period 07/07/2022, SpO2 100 %, unknown if currently breastfeeding.  GENERAL: Well-developed, well-nourished female in no acute distress.  LUNGS: Clear to auscultation bilaterally.  HEART: Regular rate and rhythm. ABDOMEN: Soft, nondistended. No organomegaly. PELVIC: intrauterine catheter deflated and removed EXTREMITIES: No cyanosis, clubbing, or edema, 2+ distal pulses.  Assessment/Plan: 36 yo s/p D&E with hemorrhage - Patient remained hemodynamically stable overnight and slept well.  - Reviewed expectations is for pain to be better than 10/10 but that no pain is not our goal. She voices understanding. Reviewed it will take time for pain to improve.  - Will do increased oxycodone per pt request. She was given tylenol and ibuprofen and has dilaudid for breakthrough. If PM check is improved, she can go home this afternoon.    LOS: 3 days    Radene Gunning, MD 08/04/2022, 8:00 AM

## 2022-08-05 ENCOUNTER — Telehealth: Payer: Self-pay | Admitting: *Deleted

## 2022-08-05 ENCOUNTER — Encounter: Payer: Self-pay | Admitting: Hematology and Oncology

## 2022-08-05 ENCOUNTER — Encounter: Payer: Self-pay | Admitting: Obstetrics & Gynecology

## 2022-08-05 ENCOUNTER — Other Ambulatory Visit (HOSPITAL_COMMUNITY): Payer: Self-pay

## 2022-08-05 DIAGNOSIS — D62 Acute posthemorrhagic anemia: Secondary | ICD-10-CM | POA: Diagnosis present

## 2022-08-05 LAB — TYPE AND SCREEN
ABO/RH(D): O NEG
Antibody Screen: POSITIVE
Unit division: 0
Unit division: 0
Unit division: 0
Unit division: 0
Unit division: 0

## 2022-08-05 LAB — BPAM RBC
Blood Product Expiration Date: 202310292359
Blood Product Expiration Date: 202310292359
Blood Product Expiration Date: 202312012359
Blood Product Expiration Date: 202312012359
Blood Product Expiration Date: 202312012359
ISSUE DATE / TIME: 202310232203
ISSUE DATE / TIME: 202310240042
ISSUE DATE / TIME: 202310240315
ISSUE DATE / TIME: 202310241441
Unit Type and Rh: 9500
Unit Type and Rh: 9500
Unit Type and Rh: 9500
Unit Type and Rh: 9500
Unit Type and Rh: 9500

## 2022-08-05 MED ORDER — ALPRAZOLAM 1 MG PO TABS
1.0000 mg | ORAL_TABLET | Freq: Three times a day (TID) | ORAL | 0 refills | Status: DC | PRN
  Filled 2022-08-05: qty 10, 4d supply, fill #0

## 2022-08-05 MED ORDER — GABAPENTIN 300 MG PO CAPS
300.0000 mg | ORAL_CAPSULE | Freq: Three times a day (TID) | ORAL | 0 refills | Status: DC
  Filled 2022-08-05: qty 30, 10d supply, fill #0

## 2022-08-05 MED ORDER — POTASSIUM CHLORIDE CRYS ER 20 MEQ PO TBCR
40.0000 meq | EXTENDED_RELEASE_TABLET | Freq: Every day | ORAL | Status: DC
  Administered 2022-08-05: 40 meq via ORAL
  Filled 2022-08-05: qty 2

## 2022-08-05 MED ORDER — DOXYCYCLINE HYCLATE 100 MG PO TABS
100.0000 mg | ORAL_TABLET | Freq: Two times a day (BID) | ORAL | 0 refills | Status: AC
  Filled 2022-08-05: qty 10, 5d supply, fill #0

## 2022-08-05 MED ORDER — SODIUM CHLORIDE 0.9 % IV SOLN
500.0000 mg | Freq: Once | INTRAVENOUS | Status: AC
  Administered 2022-08-05: 500 mg via INTRAVENOUS
  Filled 2022-08-05: qty 500

## 2022-08-05 MED ORDER — HYDROMORPHONE HCL 4 MG PO TABS
4.0000 mg | ORAL_TABLET | ORAL | 0 refills | Status: DC | PRN
  Filled 2022-08-05: qty 30, 5d supply, fill #0

## 2022-08-05 MED ORDER — DOCUSATE SODIUM 100 MG PO CAPS
100.0000 mg | ORAL_CAPSULE | Freq: Two times a day (BID) | ORAL | 2 refills | Status: DC | PRN
  Filled 2022-08-05: qty 30, 15d supply, fill #0

## 2022-08-05 MED ORDER — BISACODYL 10 MG RE SUPP
10.0000 mg | Freq: Once | RECTAL | Status: DC
  Filled 2022-08-05: qty 1

## 2022-08-05 MED ORDER — IBUPROFEN 600 MG PO TABS
600.0000 mg | ORAL_TABLET | Freq: Four times a day (QID) | ORAL | 2 refills | Status: DC | PRN
  Filled 2022-08-05: qty 60, 15d supply, fill #0

## 2022-08-05 MED ORDER — POTASSIUM CHLORIDE CRYS ER 20 MEQ PO TBCR
40.0000 meq | EXTENDED_RELEASE_TABLET | Freq: Every day | ORAL | 0 refills | Status: DC
  Filled 2022-08-05: qty 10, 5d supply, fill #0

## 2022-08-05 MED ORDER — POLYETHYLENE GLYCOL 3350 17 G PO PACK
17.0000 g | PACK | Freq: Every day | ORAL | Status: DC
  Administered 2022-08-05: 17 g via ORAL
  Filled 2022-08-05: qty 1

## 2022-08-05 MED ORDER — ACETAMINOPHEN 500 MG PO TABS
1000.0000 mg | ORAL_TABLET | Freq: Four times a day (QID) | ORAL | 2 refills | Status: DC | PRN
  Filled 2022-08-05: qty 60, 8d supply, fill #0

## 2022-08-05 NOTE — Discharge Summary (Signed)
Gynecology Physician Postoperative Discharge Summary  Patient ID: Morgan Quinn MRN: [id number] DOB/AGE: 10/14/1985 36 y.o.  Admit Date: 08/01/2022 Discharge Date: 08/05/2022  Preoperative Diagnoses:  Principal Problem:   Symptomatic anemia Active Problems:   Retained products of conception after miscarriage   Anemia associated with acute blood loss  Procedures: Procedure(s) (LRB): SUCTION DILATATION AND EVACUATION, INSERTION OF UTERINE BALLOON (N/A) Blood transfusion  Hospital Course:  Morgan Quinn is a 36 y.o. [id number] admitted for symptomatic anemia due to acute blood loss in the setting of retained products of conception after miscarriage.  She was started on antibiotics and was tranfused with 2 PRBCs.  She then underwent the aforementioned surgery, this was unfortunately complicated by hemorrhage that was alleviated with administration of Methergine, Hemabate and placement of intrauterine foley balloon inserted with 60 ml of water.  For further details about surgery, please refer to the operative report.  The foley was removed after 12 hours, bleeding was noted to be as expected.. Patient had a postoperative course complicated by report of significant pain and anxiety. There was no signs or symptoms of postoperative complications, had normal WBC and stable vitals. Had benign examination.  Multiple regimens were tried and patient refused to go home due to level of pain.  She felt her pain was being managed on POD#3 after she was started on Dilaudid, Neurontin and Ativan, and was agreeable for discharge to home. She did receive Venofer prior to discharge for her anemia, and also oral potassium repletion for her noted hypokalemia.  By time of discharge on POD#3, her pain was finally controlled on oral pain medications; she was ambulating, voiding without difficulty, tolerating regular diet and passing flatus. She was deemed stable for discharge to home.   Significant Labs:    Latest  Ref Rng & Units 08/04/2022    1:10 PM 08/03/2022    2:15 AM 08/02/2022    8:01 AM  CBC  WBC 4.0 - 10.5 K/uL 8.5  16.3    Hemoglobin 12.0 - 15.0 g/dL 8.3  10.2  9.3   Hematocrit 36.0 - 46.0 % 25.3  30.4  26.9   Platelets 150 - 400 K/uL 259  302        Latest Ref Rng & Units 08/04/2022    1:10 PM 07/30/2022    5:32 PM 04/29/2021    3:46 AM  BMP  Glucose 70 - 99 mg/dL 83  86    BUN 6 - 20 mg/dL 10  7    Creatinine 0.44 - 1.00 mg/dL 0.78  0.74    Sodium 135 - 145 mmol/L 139  136  138   Potassium 3.5 - 5.1 mmol/L 3.0  3.2  4.0   Chloride 98 - 111 mmol/L 108  103    CO2 22 - 32 mmol/L 24  21    Calcium 8.9 - 10.3 mg/dL 8.0  8.7      Discharge Exam: Blood pressure 109/64, pulse 70, temperature 97.9 F (36.6 C), temperature source Oral, resp. rate 16, height 5\' 4"  (1.626 m), weight 80.4 kg, last menstrual period 07/07/2022, SpO2 96 %, unknown if currently breastfeeding. General appearance: alert and no distress  Resp: normal breath sounds Cardio: regular rate and rhythm  GI: soft, non-tender; bowel sounds normal; no masses, no organomegaly.  Pelvic: scant blood on pad (reported by patient)  Extremities: extremities normal, atraumatic, no cyanosis or edema and Homans sign is negative, no sign of DVT  Discharged Condition: Stable  Disposition: Discharge disposition: 01-Home  or Self Care      Allergies as of 08/05/2022   No Known Allergies      Medication List     STOP taking these medications    oxyCODONE-acetaminophen 5-325 MG tablet Commonly known as: PERCOCET/ROXICET       TAKE these medications    acetaminophen 500 MG tablet Commonly known as: TYLENOL Take 2 tablets (1,000 mg total) by mouth every 6 (six) hours as needed for mild pain. What changed: reasons to take this   ALPRAZolam 1 MG tablet Commonly known as: Xanax Take 1 tablet (1 mg total) by mouth 3 (three) times daily as needed for anxiety.   docusate sodium 100 MG capsule Commonly known as:  COLACE Take 1 capsule (100 mg total) by mouth 2 (two) times daily as needed for mild constipation or moderate constipation.   doxycycline 100 MG tablet Commonly known as: VIBRA-TABS Take 1 tablet (100 mg total) by mouth 2 (two) times daily for 5 days.   gabapentin 300 MG capsule Commonly known as: NEURONTIN Take 1 capsule (300 mg total) by mouth 3 (three) times daily for 10 days.   HYDROmorphone 4 MG tablet Commonly known as: DILAUDID Take 1 tablet (4 mg total) by mouth every 4 (four) hours as needed for up to 7 days for severe pain (Postoperative pain).   ibuprofen 600 MG tablet Commonly known as: ADVIL Take 1 tablet (600 mg total) by mouth every 6 (six) hours as needed for moderate pain or headache. What changed:  medication strength how much to take when to take this reasons to take this   potassium chloride SA 20 MEQ tablet Commonly known as: KLOR-CON M Take 2 tablets (40 mEq total) by mouth daily for 5 days.       No future appointments.   Total discharge time: 25 minutes   Signed:  Verita Schneiders, MD, [place] Attending [place], [facility]

## 2022-08-05 NOTE — Telephone Encounter (Signed)
Left patient a message to call and schedule 4 week Post Op appointment with Dr. Damita Dunnings per Dr. Damita Dunnings and Anyanwu.

## 2022-08-05 NOTE — Progress Notes (Signed)
Discharge instructions and prescriptions given to pt. Discussed signs and symptoms to report to the MD, upcoming appointments, and meds. Pt verbalizes understanding and has no questions or concerns at this time. Pt discharged home from hospital in stable condition. 

## 2022-08-11 ENCOUNTER — Other Ambulatory Visit: Payer: Self-pay | Admitting: Obstetrics & Gynecology

## 2022-08-11 ENCOUNTER — Other Ambulatory Visit: Payer: Self-pay | Admitting: Obstetrics and Gynecology

## 2022-08-11 DIAGNOSIS — F419 Anxiety disorder, unspecified: Secondary | ICD-10-CM

## 2022-08-11 DIAGNOSIS — Z09 Encounter for follow-up examination after completed treatment for conditions other than malignant neoplasm: Secondary | ICD-10-CM

## 2022-08-11 MED ORDER — ALPRAZOLAM 1 MG PO TABS: 1.0000 mg | ORAL_TABLET | Freq: Three times a day (TID) | ORAL | 0 refills | Status: DC | PRN

## 2022-08-11 MED ORDER — HYDROMORPHONE HCL 4 MG PO TABS: 4.0000 mg | ORAL_TABLET | ORAL | 0 refills | Status: AC | PRN

## 2022-08-11 NOTE — Progress Notes (Signed)
Pt called requesting refill on pain medication and anxiety medication. Having panic attacks and nightmares. Will do referral to [facility] and PCP for ongoing management of this as this would not be something I manage chronically. Additionally for pain, will give one additional refill, but would not refill beyond this. If she is still in pain after 2 weeks requiring dilaudid (or anything beyond ibuprofen and tylenol), I would say this is not normal and needs to be re-evaluated as another source of pain.   Radene Gunning, MD Attending [place], [facility] for [facility], [place]

## 2022-08-16 ENCOUNTER — Other Ambulatory Visit (HOSPITAL_BASED_OUTPATIENT_CLINIC_OR_DEPARTMENT_OTHER): Payer: Self-pay

## 2022-08-16 MED ORDER — GABAPENTIN 300 MG PO CAPS: 300.0000 mg | ORAL_CAPSULE | Freq: Three times a day (TID) | ORAL | 2 refills | Status: DC
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
## 2022-09-08 ENCOUNTER — Ambulatory Visit (INDEPENDENT_AMBULATORY_CARE_PROVIDER_SITE_OTHER): Payer: Medicaid Other | Admitting: Obstetrics and Gynecology

## 2022-09-08 ENCOUNTER — Encounter: Payer: Self-pay | Admitting: Obstetrics and Gynecology

## 2022-09-08 VITALS — BP 120/81 | HR 69 | Resp 16 | Ht 63.0 in | Wt 171.0 lb

## 2022-09-08 DIAGNOSIS — Z3009 Encounter for other general counseling and advice on contraception: Secondary | ICD-10-CM

## 2022-09-08 DIAGNOSIS — F32A Depression, unspecified: Secondary | ICD-10-CM

## 2022-09-08 DIAGNOSIS — Z09 Encounter for follow-up examination after completed treatment for conditions other than malignant neoplasm: Secondary | ICD-10-CM

## 2022-09-08 DIAGNOSIS — F5102 Adjustment insomnia: Secondary | ICD-10-CM

## 2022-09-08 DIAGNOSIS — F419 Anxiety disorder, unspecified: Secondary | ICD-10-CM

## 2022-09-08 LAB — POCT URINE PREGNANCY: Preg Test, Ur: NEGATIVE

## 2022-09-08 MED ORDER — BUSPIRONE HCL 10 MG PO TABS: 10.0000 mg | ORAL_TABLET | Freq: Three times a day (TID) | ORAL | 3 refills | Status: DC

## 2022-09-08 MED ORDER — XULANE 150-35 MCG/24HR TD PTWK: 1.0000 | MEDICATED_PATCH | TRANSDERMAL | 3 refills | Status: DC

## 2022-09-08 MED ORDER — SERTRALINE HCL 25 MG PO TABS: 25.0000 mg | ORAL_TABLET | Freq: Every day | ORAL | 1 refills | Status: DC

## 2022-09-08 MED ORDER — HYDROXYZINE HCL 25 MG PO TABS: 25.0000 mg | ORAL_TABLET | Freq: Four times a day (QID) | ORAL | 1 refills | Status: DC | PRN

## 2022-09-22 ENCOUNTER — Encounter: Payer: Self-pay | Admitting: *Deleted

## 2022-09-28 ENCOUNTER — Encounter: Payer: Medicaid Other | Admitting: Licensed Clinical Social Worker

## 2022-09-28 ENCOUNTER — Telehealth: Payer: Self-pay | Admitting: Licensed Clinical Social Worker
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
## 2022-10-04 ENCOUNTER — Other Ambulatory Visit: Payer: Self-pay | Admitting: Obstetrics and Gynecology

## 2022-10-04 DIAGNOSIS — F5102 Adjustment insomnia: Secondary | ICD-10-CM

## 2022-10-04 DIAGNOSIS — F32A Depression, unspecified: Secondary | ICD-10-CM

## 2022-10-06 IMAGING — US US OB COMP LESS 14 WK
2 series · 15 of 28 positions shown · non-contrast
Comparison: None.

CLINICAL DATA: Vaginal bleeding.  Recent spontaneous abortion.

EXAM:
OBSTETRIC <14 WK ULTRASOUND
TECHNIQUE: Transabdominal ultrasound was performed for evaluation of the
gestation as well as the maternal uterus and adnexal regions.

[Series 1: us ob comp less 14 wk · 28 acquisitions, 14 frames shown (1 of 2)]
[im 1/28]
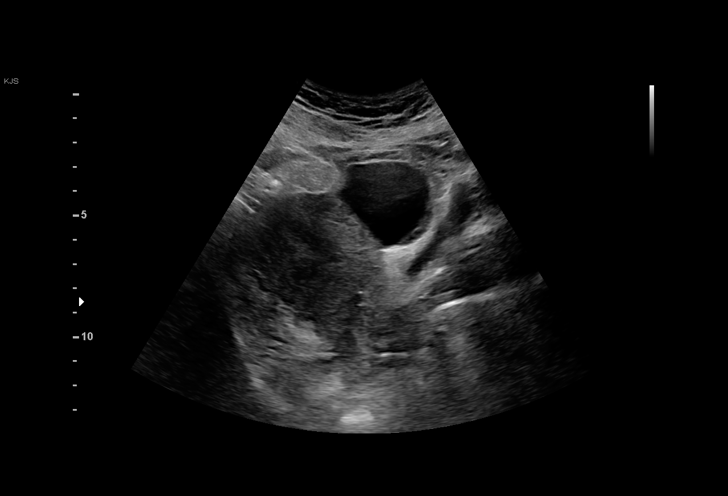
[im 3/28]
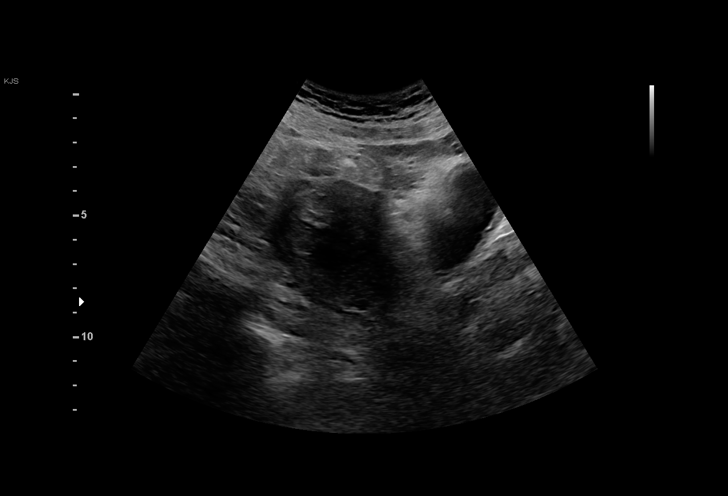
[im 5/28]
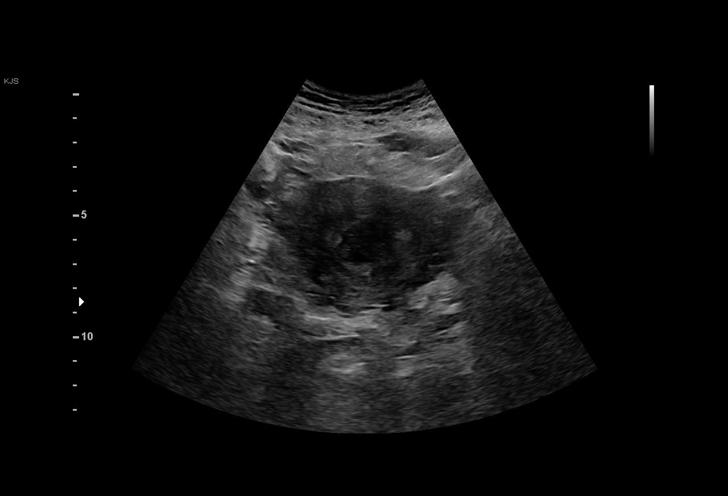
[im 7/28]
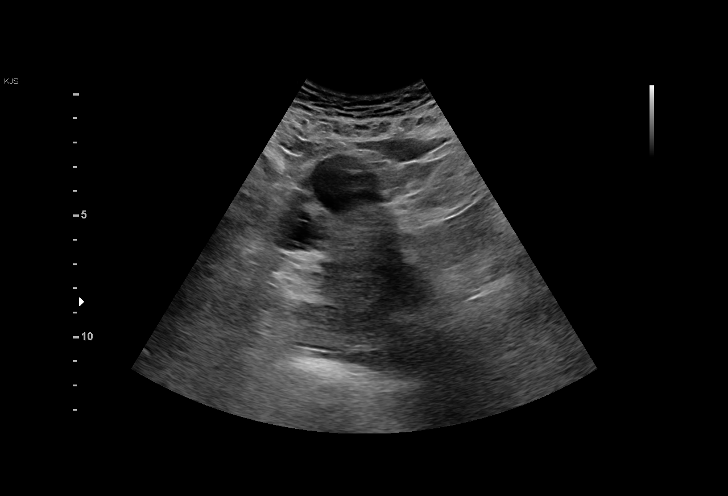
[im 9/28]
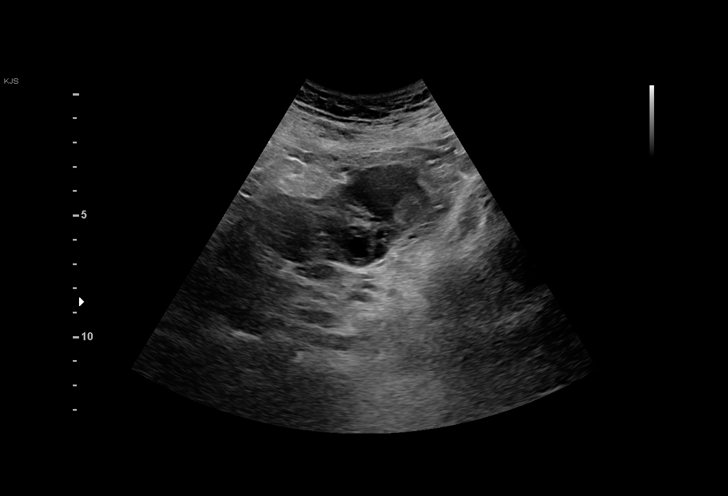
[im 11/28]
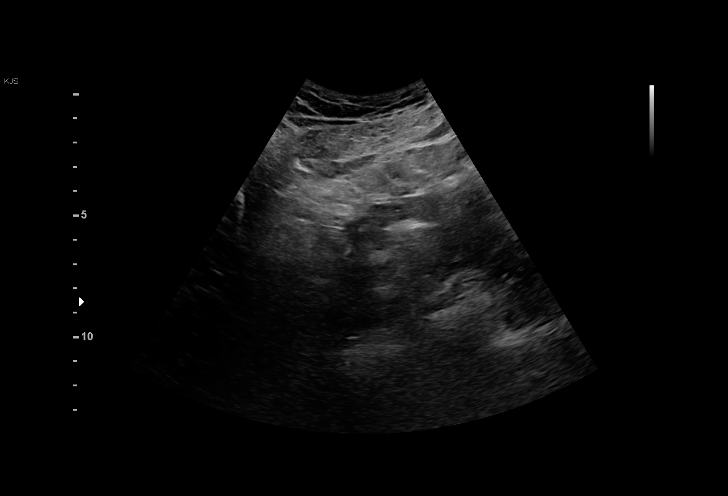
[im 13/28]
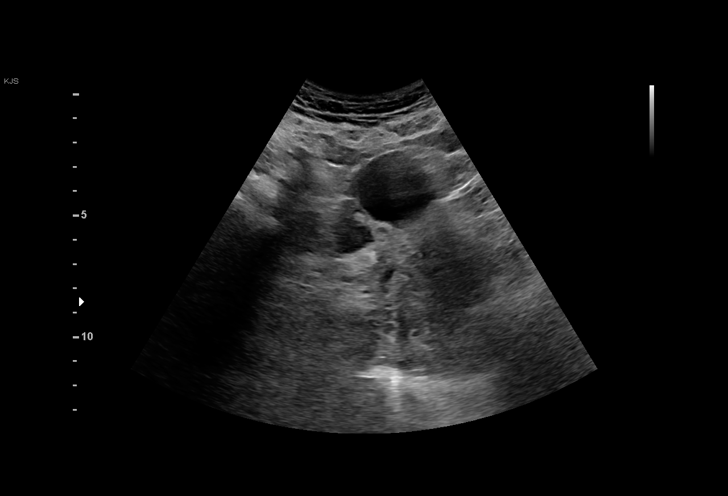
[im 16/28]
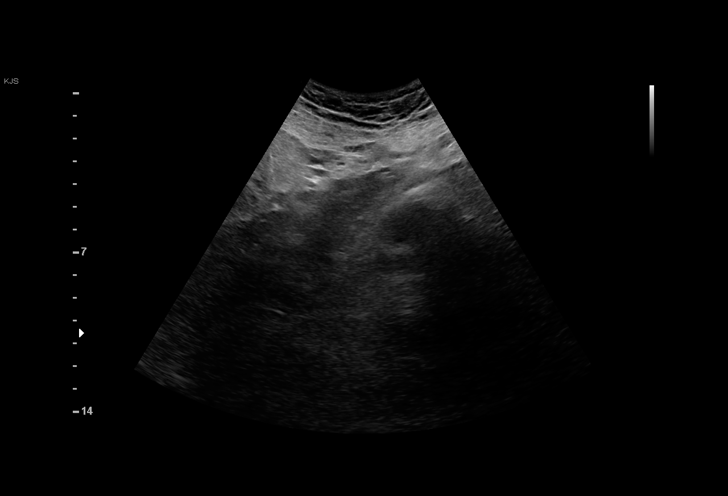
[im 17/28]
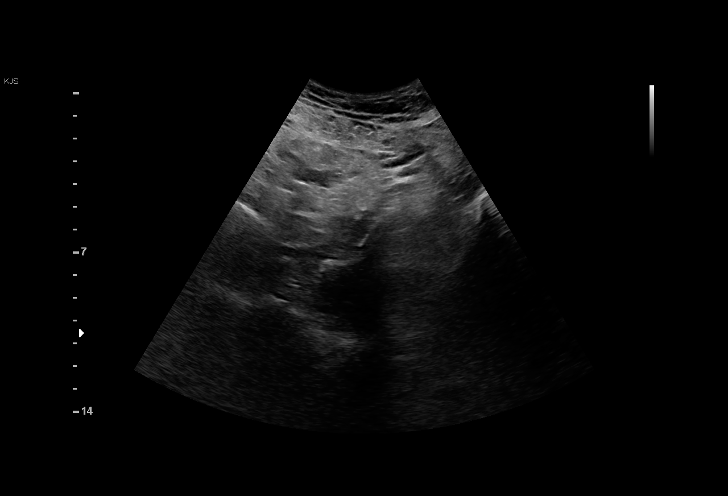
[im 19/28]
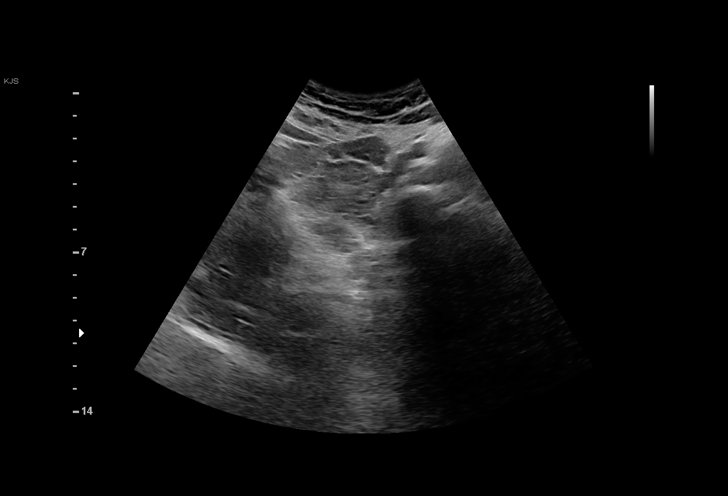
[im 21/28]
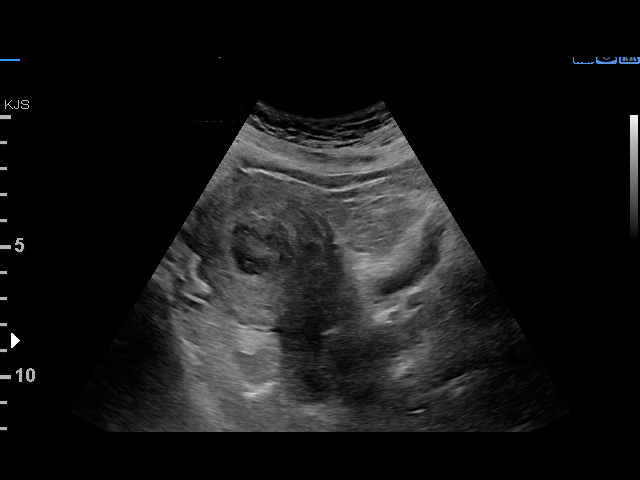
[im 23/28]
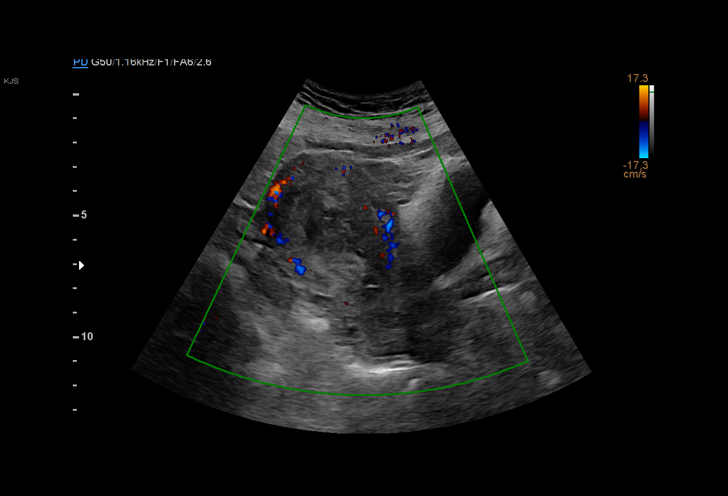
[im 25/28]
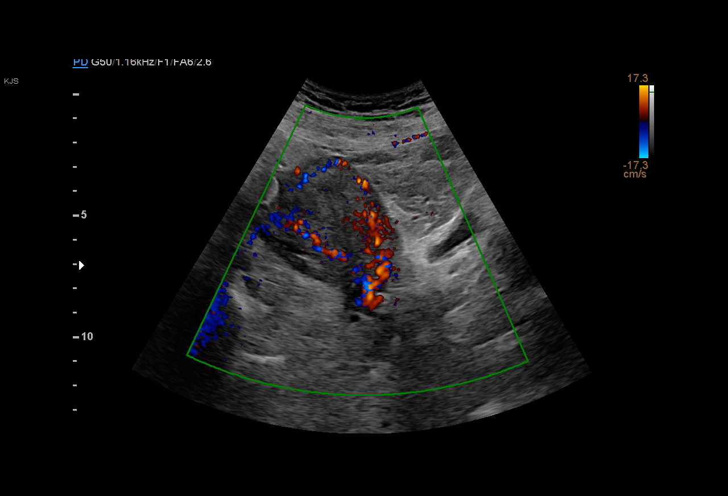
[im 28/28]
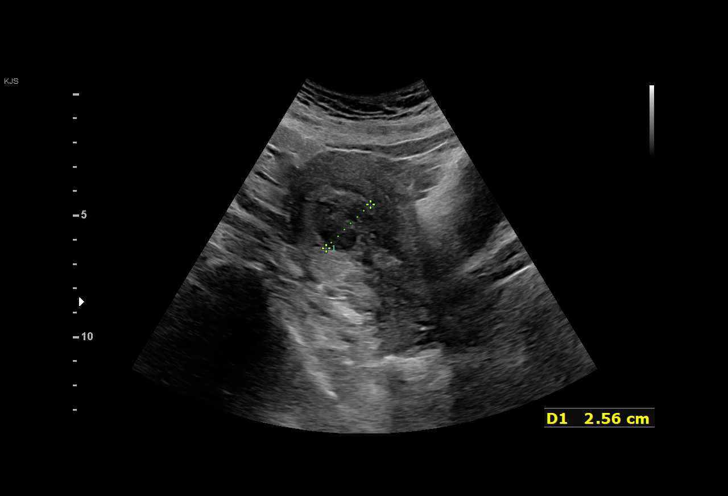

[Series 2: us ob comp less 14 wk · 1 of 2 slices shown (2 of 2)]
[im 2/2]
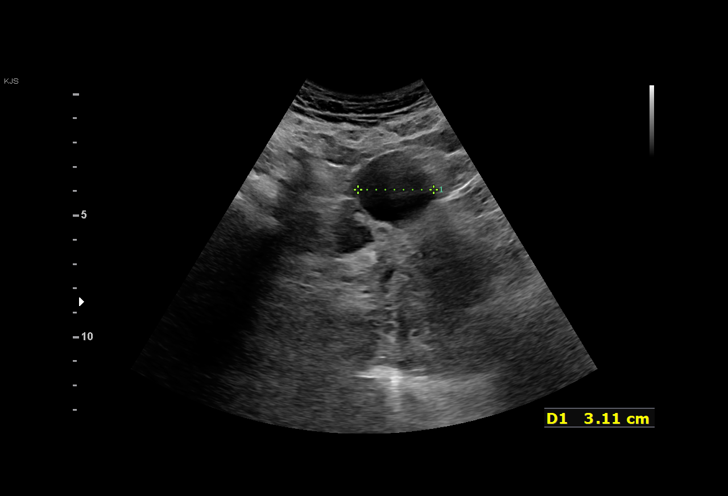

[15 of 28 positions shown; findings below may reference images not displayed]

FINDINGS: Intrauterine gestational sac: No normal gestational sac seen

Maternal uterus/adnexae: Heterogeneous hypoechoic substance is seen
within the endometrial cavity with thickness measuring 2.6 cm. A few
possible areas of internal blood flow are seen on color Doppler
ultrasound, and these findings are suspicious for retained products
of conception.

No fibroids identified. A simple appearing cyst is seen in the right
adnexa which measures 3.9 x 3.5 x 3.1 cm. No other adnexal mass
identified. No evidence of free fluid.
IMPRESSION: Heterogeneous hypoechoic sub cysts within endometrial cavity, with
possible small areas of internal blood flow. These findings are
suspicious for retained products of conception.

3.9 cm simple appearing right ovarian cyst. No follow-up imaging
recommended.

## 2022-10-17 ENCOUNTER — Encounter: Payer: Medicaid Other | Admitting: Licensed Clinical Social Worker

## 2022-10-26 ENCOUNTER — Telehealth (HOSPITAL_COMMUNITY): Payer: Self-pay | Admitting: Psychiatry

## 2022-10-26 NOTE — Telephone Encounter (Signed)
Voicemail left for patient requesting call back to reschedule on 10/25/2022. MyChart message sent 10/26/2022.

## 2022-10-27 ENCOUNTER — Ambulatory Visit (HOSPITAL_COMMUNITY): Payer: Self-pay | Admitting: Psychiatry

## 2022-10-31 ENCOUNTER — Ambulatory Visit (HOSPITAL_COMMUNITY): Payer: Self-pay | Admitting: Licensed Clinical Social Worker

## 2022-11-04 ENCOUNTER — Telehealth (HOSPITAL_COMMUNITY): Payer: Medicaid Other | Admitting: Psychiatry

## 2022-11-28 ENCOUNTER — Ambulatory Visit (HOSPITAL_COMMUNITY): Payer: Medicaid Other | Admitting: Psychiatry

## 2022-11-30 ENCOUNTER — Ambulatory Visit (INDEPENDENT_AMBULATORY_CARE_PROVIDER_SITE_OTHER): Payer: Medicaid Other | Admitting: Licensed Clinical Social Worker

## 2022-11-30 DIAGNOSIS — F411 Generalized anxiety disorder: Secondary | ICD-10-CM | POA: Diagnosis not present

## 2022-11-30 DIAGNOSIS — F332 Major depressive disorder, recurrent severe without psychotic features: Secondary | ICD-10-CM
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
## 2022-12-01 ENCOUNTER — Emergency Department (HOSPITAL_BASED_OUTPATIENT_CLINIC_OR_DEPARTMENT_OTHER)
Admission: EM | Admit: 2022-12-01 | Discharge: 2022-12-01 | Disposition: A | Discharge status: Home / Self Care | Payer: Medicaid Other | Attending: Emergency Medicine | Admitting: Emergency Medicine

## 2022-12-01 ENCOUNTER — Emergency Department (HOSPITAL_BASED_OUTPATIENT_CLINIC_OR_DEPARTMENT_OTHER): Payer: Medicaid Other

## 2022-12-01 ENCOUNTER — Encounter (HOSPITAL_BASED_OUTPATIENT_CLINIC_OR_DEPARTMENT_OTHER): Payer: Self-pay | Admitting: Urology

## 2022-12-01 ENCOUNTER — Other Ambulatory Visit: Payer: Self-pay

## 2022-12-01 DIAGNOSIS — W52XXXA Crushed, pushed or stepped on by crowd or human stampede, initial encounter: Secondary | ICD-10-CM | POA: Insufficient documentation

## 2022-12-01 DIAGNOSIS — M79602 Pain in left arm: Secondary | ICD-10-CM

## 2022-12-01 DIAGNOSIS — M79622 Pain in left upper arm: Secondary | ICD-10-CM | POA: Diagnosis not present

## 2022-12-01 MED ORDER — CYCLOBENZAPRINE HCL 10 MG PO TABS: 10.0000 mg | ORAL_TABLET | Freq: Two times a day (BID) | ORAL | 0 refills | Status: DC | PRN

## 2022-12-01 MED ORDER — NAPROXEN 500 MG PO TABS: 500.0000 mg | ORAL_TABLET | Freq: Two times a day (BID) | ORAL | 0 refills | Status: DC

## 2022-12-01 MED ORDER — ACETAMINOPHEN 500 MG PO TABS
1000.0000 mg | ORAL_TABLET | Freq: Once | ORAL | Status: AC
  Administered 2022-12-01: 1000 mg via ORAL
  Filled 2022-12-01: qty 2
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
## 2022-12-01 NOTE — Discharge Instructions (Addendum)
Your x-ray today did not show any evidence of fracture or dislocation of the joint. Take tylenol/ibuprofen/naproxen or Flexeril for pain. I recommend close follow-up with PCP for reevaluation.  Please do not hesitate to return to emergency department if worrisome signs symptoms we discussed become apparent.
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
## 2022-12-16 ENCOUNTER — Encounter (HOSPITAL_COMMUNITY): Payer: Self-pay

## 2022-12-16 ENCOUNTER — Ambulatory Visit (INDEPENDENT_AMBULATORY_CARE_PROVIDER_SITE_OTHER): Payer: Medicaid Other | Admitting: Licensed Clinical Social Worker

## 2022-12-16 DIAGNOSIS — F411 Generalized anxiety disorder: Secondary | ICD-10-CM

## 2022-12-16 DIAGNOSIS — F332 Major depressive disorder, recurrent severe without psychotic features: Secondary | ICD-10-CM

## 2022-12-16 NOTE — Progress Notes (Signed)
Therapist contacted patient through email and she did not respond

## 2022-12-29 ENCOUNTER — Ambulatory Visit (INDEPENDENT_AMBULATORY_CARE_PROVIDER_SITE_OTHER): Payer: Medicaid Other | Admitting: Licensed Clinical Social Worker

## 2022-12-29 ENCOUNTER — Encounter (HOSPITAL_COMMUNITY): Payer: Self-pay | Admitting: Licensed Clinical Social Worker

## 2022-12-29 ENCOUNTER — Encounter (HOSPITAL_COMMUNITY): Payer: Self-pay

## 2022-12-29 DIAGNOSIS — F332 Major depressive disorder, recurrent severe without psychotic features: Secondary | ICD-10-CM

## 2022-12-29 DIAGNOSIS — F411 Generalized anxiety disorder: Secondary | ICD-10-CM

## 2022-12-29 NOTE — Progress Notes (Signed)
Therapist contacted patient through email and she did not respond 

## 2023-01-11 ENCOUNTER — Encounter (HOSPITAL_COMMUNITY): Payer: Self-pay | Admitting: Licensed Clinical Social Worker

## 2023-01-18 ENCOUNTER — Ambulatory Visit (HOSPITAL_COMMUNITY): Payer: Medicaid Other | Admitting: Licensed Clinical Social Worker

## 2023-02-01 ENCOUNTER — Emergency Department (HOSPITAL_BASED_OUTPATIENT_CLINIC_OR_DEPARTMENT_OTHER): Payer: Medicaid Other

## 2023-02-01 ENCOUNTER — Other Ambulatory Visit: Payer: Self-pay

## 2023-02-01 ENCOUNTER — Ambulatory Visit (HOSPITAL_COMMUNITY): Payer: Medicaid Other | Admitting: Licensed Clinical Social Worker

## 2023-02-01 ENCOUNTER — Emergency Department (HOSPITAL_BASED_OUTPATIENT_CLINIC_OR_DEPARTMENT_OTHER)
Admission: EM | Admit: 2023-02-01 | Discharge: 2023-02-01 | Disposition: A | Discharge status: Home / Self Care | Payer: Medicaid Other | Attending: Emergency Medicine | Admitting: Emergency Medicine

## 2023-02-01 ENCOUNTER — Encounter (HOSPITAL_BASED_OUTPATIENT_CLINIC_OR_DEPARTMENT_OTHER): Payer: Self-pay | Admitting: Emergency Medicine

## 2023-02-01 DIAGNOSIS — S0990XA Unspecified injury of head, initial encounter: Secondary | ICD-10-CM | POA: Diagnosis present

## 2023-02-01 DIAGNOSIS — S199XXA Unspecified injury of neck, initial encounter: Secondary | ICD-10-CM | POA: Insufficient documentation

## 2023-02-01 DIAGNOSIS — S0083XA Contusion of other part of head, initial encounter: Secondary | ICD-10-CM | POA: Insufficient documentation

## 2023-02-01 DIAGNOSIS — S4992XA Unspecified injury of left shoulder and upper arm, initial encounter: Secondary | ICD-10-CM | POA: Diagnosis not present

## 2023-02-01 DIAGNOSIS — T148XXA Other injury of unspecified body region, initial encounter: Secondary | ICD-10-CM

## 2023-02-01 LAB — CBC WITH DIFFERENTIAL/PLATELET
Abs Immature Granulocytes: 0.01 10*3/uL (ref 0.00–0.07)
Basophils Absolute: 0 10*3/uL (ref 0.0–0.1)
Basophils Relative: 0 %
Eosinophils Absolute: 0 10*3/uL (ref 0.0–0.5)
Eosinophils Relative: 0 %
HCT: 39.4 % (ref 36.0–46.0)
Hemoglobin: 12.7 g/dL (ref 12.0–15.0)
Immature Granulocytes: 0 %
Lymphocytes Relative: 32 %
Lymphs Abs: 2.1 10*3/uL (ref 0.7–4.0)
MCH: 28.9 pg (ref 26.0–34.0)
MCHC: 32.2 g/dL (ref 30.0–36.0)
MCV: 89.5 fL (ref 80.0–100.0)
Monocytes Absolute: 0.4 10*3/uL (ref 0.1–1.0)
Monocytes Relative: 6 %
Neutro Abs: 4.1 10*3/uL (ref 1.7–7.7)
Neutrophils Relative %: 62 %
Platelets: 293 10*3/uL (ref 150–400)
RBC: 4.4 MIL/uL (ref 3.87–5.11)
RDW: 13.5 % (ref 11.5–15.5)
WBC: 6.6 10*3/uL (ref 4.0–10.5)
nRBC: 0 % (ref 0.0–0.2)

## 2023-02-01 LAB — COMPREHENSIVE METABOLIC PANEL
ALT: 18 U/L (ref 0–44)
AST: 19 U/L (ref 15–41)
Albumin: 4.5 g/dL (ref 3.5–5.0)
Alkaline Phosphatase: 58 U/L (ref 38–126)
Anion gap: 9 (ref 5–15)
BUN: 21 mg/dL — ABNORMAL HIGH (ref 6–20)
CO2: 22 mmol/L (ref 22–32)
Calcium: 8.8 mg/dL — ABNORMAL LOW (ref 8.9–10.3)
Chloride: 107 mmol/L (ref 98–111)
Creatinine, Ser: 0.87 mg/dL (ref 0.44–1.00)
GFR, Estimated: >60 mL/min (ref >60)
Glucose, Bld: 94 mg/dL (ref 70–99)
Potassium: 3.9 mmol/L (ref 3.5–5.1)
Sodium: 138 mmol/L (ref 135–145)
Total Bilirubin: 1.6 mg/dL — ABNORMAL HIGH (ref 0.3–1.2)
Total Protein: 8.5 g/dL — ABNORMAL HIGH (ref 6.5–8.1)

## 2023-02-01 LAB — HCG, SERUM, QUALITATIVE: Preg, Serum: NEGATIVE

## 2023-02-01 MED ORDER — MORPHINE SULFATE (PF) 4 MG/ML IV SOLN
4.0000 mg | Freq: Once | INTRAVENOUS | Status: AC
  Administered 2023-02-01: 4 mg via INTRAVENOUS
  Filled 2023-02-01: qty 1

## 2023-02-01 MED ORDER — IOHEXOL 350 MG/ML SOLN
75.0000 mL | Freq: Once | INTRAVENOUS | Status: AC | PRN
  Administered 2023-02-01: 75 mL via INTRAVENOUS
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
## 2023-02-01 NOTE — Discharge Instructions (Signed)
Your history, exam, and evaluation today led [place] to get imaging to rule out significant traumatic injuries.  The CTA of the head and neck fortunately did not show evidence of vascular injury however it did show some contusion to the left face.  This correlates with where you are having the tenderness and bruising.  The x-ray of the chest and shoulder also did not show acute fractures but I suspect you have some soft tissue injury from the pain on exam.  Please rest and stay hydrated and follow-up with your primary doctor.  You may use over-the-counter medications to help with inflammation and discomfort.  If any symptoms change or worsen acutely, please return to the nearest emergency department.
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
## 2023-02-28 IMAGING — US US OB < 14 WEEKS - US OB TV
1 series · 15 of 28 positions shown · non-contrast
Comparison: None.

CLINICAL DATA: Positive pregnancy test with vaginal bleeding.

EXAM:
OBSTETRIC <14 WK US AND TRANSVAGINAL OB US
TECHNIQUE: Both transabdominal and transvaginal ultrasound examinations were
performed for complete evaluation of the gestation as well as the
maternal uterus, adnexal regions, and pelvic cul-de-sac.
Transvaginal technique was performed to assess early pregnancy.

[Series 1: us ob < 14 weeks - us ob tv · 15 of 57 slices shown]
[im 1/57]
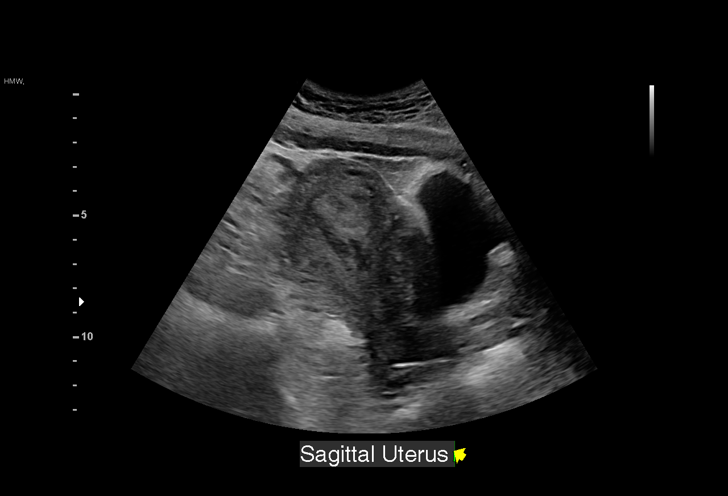
[im 5/57]
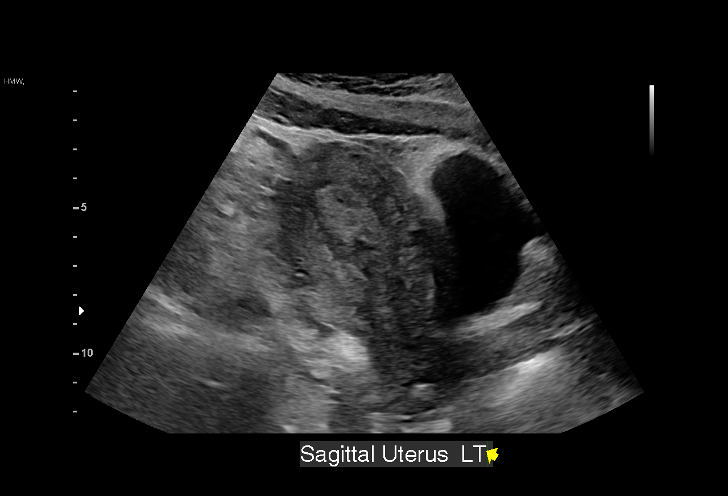
[im 9/57]
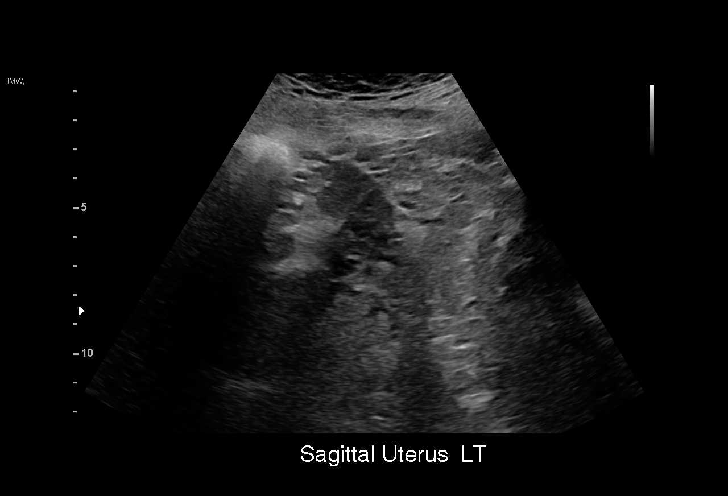
[im 13/57]
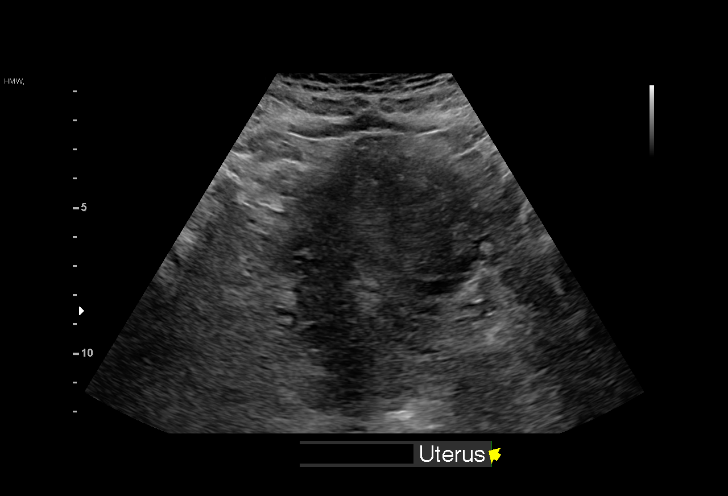
[im 17/57]
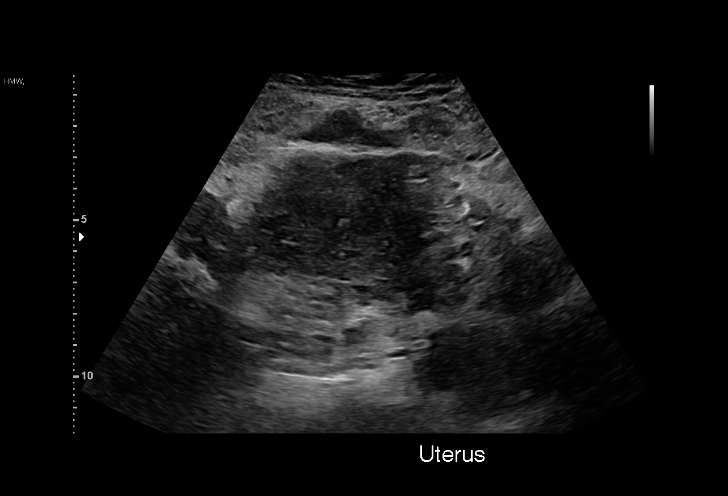
[im 21/57]
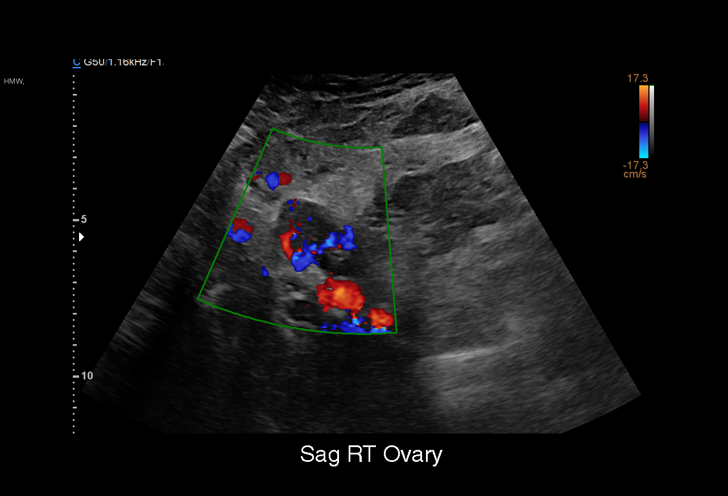
[im 25/57]
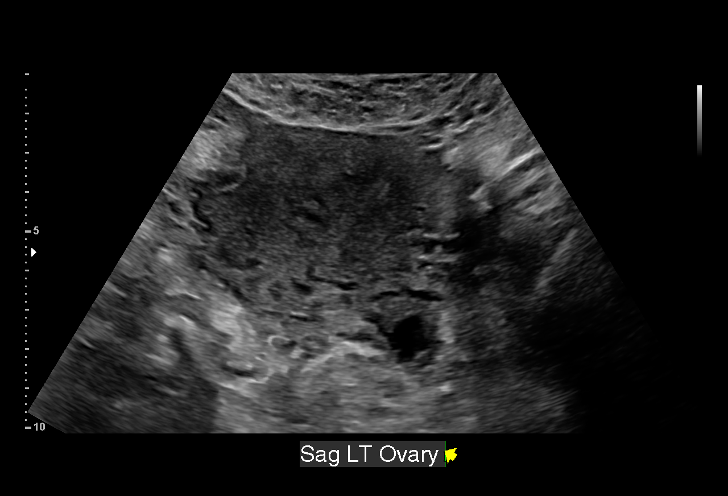
[im 30/57]
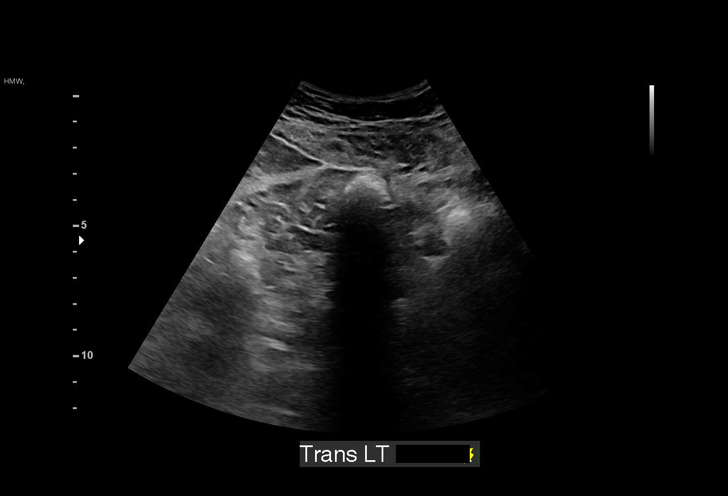
[im 32/57]
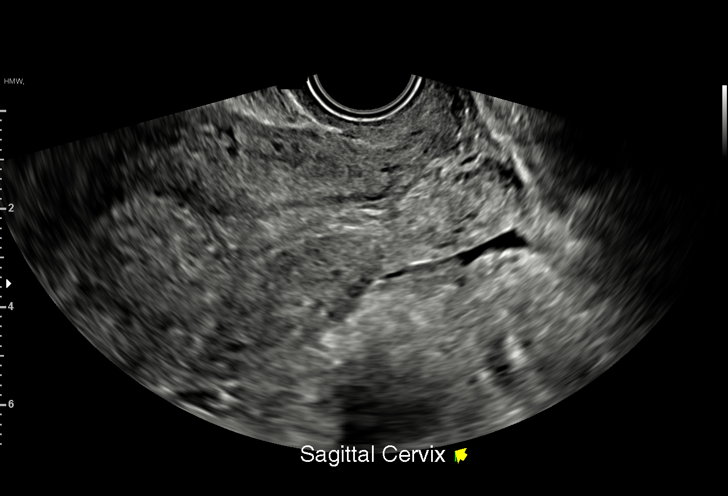
[im 36/57]
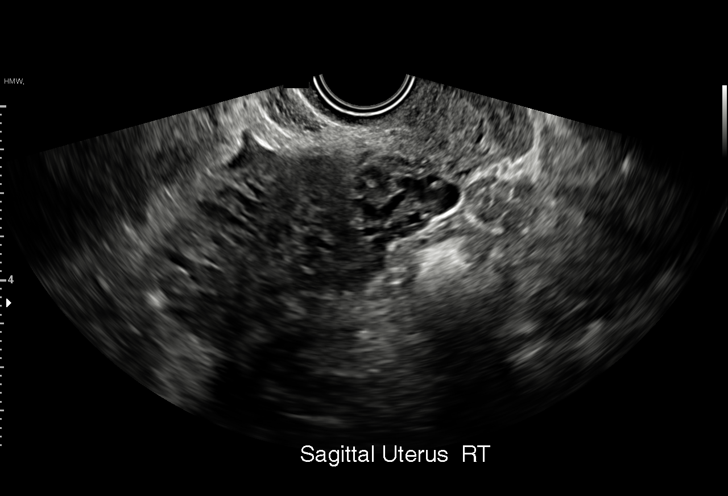
[im 40/57]
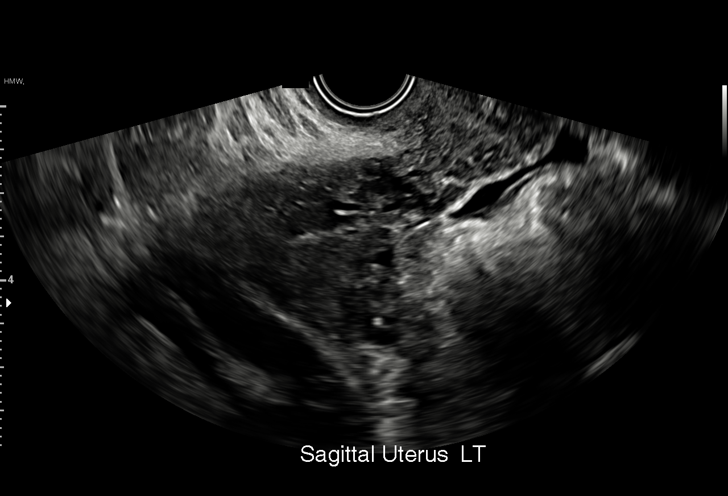
[im 44/57]
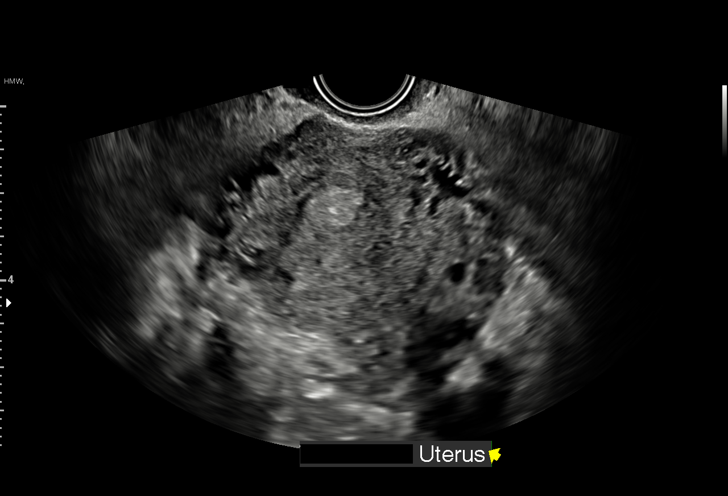
[im 48/57]
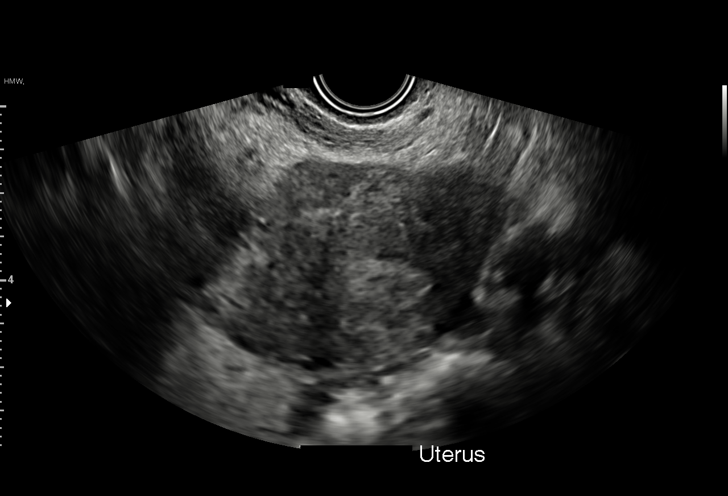
[im 52/57]
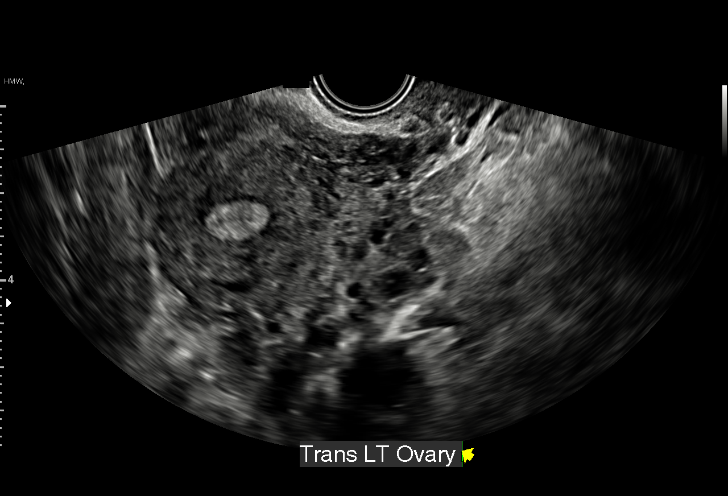
[im 57/57]
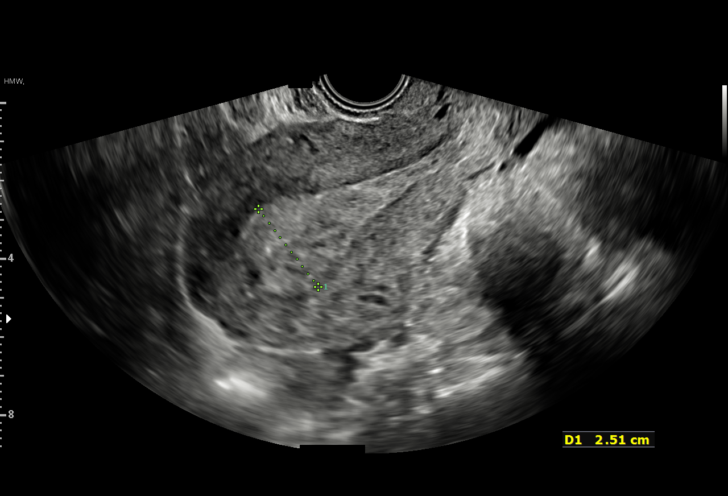

[15 of 28 positions shown; findings below may reference images not displayed]

FINDINGS: Intrauterine gestational sac: None.

Yolk sac:  N/A

Embryo:  N/A

Cardiac Activity: N/A

Subchorionic hemorrhage:  None visualized.

Maternal uterus/adnexae: Endometrium appears mildly thickened and
irregular, nonspecific. No evidence for ovarian or adnexal mass.
Trace simple appearing free fluid is noted in the cul-de-sac.
IMPRESSION: 1. No evidence for intrauterine gestational sac. Given the history
of a positive pregnancy test, differential considerations include
intrauterine gestation too early to visualize, completed abortion,
or nonvisualized ectopic pregnancy. Close clinical correlation is
recommended with serial beta-hCG and followup ultrasound as
warranted.

## 2023-11-21 ENCOUNTER — Inpatient Hospital Stay (HOSPITAL_COMMUNITY): Payer: Medicaid Other

## 2023-11-21 ENCOUNTER — Inpatient Hospital Stay (HOSPITAL_COMMUNITY)
Admission: AD | Admit: 2023-11-21 | Discharge: 2023-11-21 | Disposition: A | Discharge status: Home / Self Care | Payer: Medicaid Other | Attending: Obstetrics and Gynecology | Admitting: Obstetrics and Gynecology

## 2023-11-21 ENCOUNTER — Other Ambulatory Visit: Payer: Self-pay

## 2023-11-21 ENCOUNTER — Encounter (HOSPITAL_COMMUNITY): Payer: Self-pay | Admitting: Obstetrics and Gynecology

## 2023-11-21 DIAGNOSIS — O26891 Other specified pregnancy related conditions, first trimester: Secondary | ICD-10-CM | POA: Insufficient documentation

## 2023-11-21 DIAGNOSIS — R109 Unspecified abdominal pain: Secondary | ICD-10-CM | POA: Insufficient documentation

## 2023-11-21 DIAGNOSIS — Z3A01 Less than 8 weeks gestation of pregnancy: Secondary | ICD-10-CM | POA: Insufficient documentation

## 2023-11-21 DIAGNOSIS — N854 Malposition of uterus: Secondary | ICD-10-CM | POA: Diagnosis not present

## 2023-11-21 DIAGNOSIS — O209 Hemorrhage in early pregnancy, unspecified: Secondary | ICD-10-CM | POA: Insufficient documentation

## 2023-11-21 DIAGNOSIS — O26899 Other specified pregnancy related conditions, unspecified trimester: Secondary | ICD-10-CM

## 2023-11-21 DIAGNOSIS — Z6791 Unspecified blood type, Rh negative: Secondary | ICD-10-CM

## 2023-11-21 LAB — CBC
HCT: 39.3 % (ref 36.0–46.0)
Hemoglobin: 12.7 g/dL (ref 12.0–15.0)
MCH: 28.4 pg (ref 26.0–34.0)
MCHC: 32.3 g/dL (ref 30.0–36.0)
MCV: 87.9 fL (ref 80.0–100.0)
Platelets: 255 10*3/uL (ref 150–400)
RBC: 4.47 MIL/uL (ref 3.87–5.11)
RDW: 13.1 % (ref 11.5–15.5)
WBC: 7.6 10*3/uL (ref 4.0–10.5)
nRBC: 0 % (ref 0.0–0.2)

## 2023-11-21 LAB — URINALYSIS, ROUTINE W REFLEX MICROSCOPIC
Bacteria, UA: NONE SEEN
Bilirubin Urine: NEGATIVE
Glucose, UA: NEGATIVE mg/dL
Ketones, ur: NEGATIVE mg/dL
Nitrite: NEGATIVE
Protein, ur: 100 mg/dL — AB
RBC / HPF: >50 RBC/hpf (ref 0–5)
Specific Gravity, Urine: 1.021 (ref 1.005–1.030)
pH: 7 (ref 5.0–8.0)

## 2023-11-21 LAB — HCG, QUANTITATIVE, PREGNANCY: hCG, Beta Chain, Quant, S: 1768 m[IU]/mL — ABNORMAL HIGH (ref <5)

## 2023-11-21 LAB — POCT PREGNANCY, URINE: Preg Test, Ur: POSITIVE — AB

## 2023-11-21 MED ORDER — CYCLOBENZAPRINE HCL 10 MG PO TABS: 10.0000 mg | ORAL_TABLET | Freq: Two times a day (BID) | ORAL | 0 refills | Status: DC | PRN

## 2023-11-21 MED ORDER — OXYCODONE-ACETAMINOPHEN 5-325 MG PO TABS: 1.0000 | ORAL_TABLET | Freq: Four times a day (QID) | ORAL | 0 refills | Status: DC | PRN

## 2023-11-21 MED ORDER — OXYCODONE-ACETAMINOPHEN 5-325 MG PO TABS
2.0000 | ORAL_TABLET | Freq: Once | ORAL | Status: AC
  Administered 2023-11-21: 2 via ORAL
  Filled 2023-11-21: qty 2

## 2023-11-21 MED ORDER — ONDANSETRON 4 MG PO TBDP
8.0000 mg | ORAL_TABLET | Freq: Once | ORAL | Status: AC
  Administered 2023-11-21: 8 mg via ORAL
  Filled 2023-11-21: qty 2

## 2023-11-21 MED ORDER — LORAZEPAM 0.5 MG PO TABS: 1.0000 mg | ORAL_TABLET | Freq: Three times a day (TID) | ORAL | 0 refills | Status: DC | PRN

## 2023-11-21 NOTE — MAU Provider Note (Signed)
History     CSN: [id number]  Arrival date and time: 11/21/23 1318   Event Date/Time   First Provider Initiated Contact with Patient 11/21/23 1436      Chief Complaint  Patient presents with   Abdominal Pain   Vaginal Bleeding   Abdominal Pain Pertinent negatives include no diarrhea, dysuria, fever, nausea or vomiting.  Vaginal Bleeding Associated symptoms include abdominal pain. Pertinent negatives include no back pain, chills, diarrhea, dysuria, fever, flank pain, nausea, rash, sore throat or vomiting.   Patient is 38 y.o. [id number] [redacted]w[redacted]d here with complaints of vaginal bleeding. Reports sudden onset of bright red bleeding with cramping yesterday. She filled 2 large pads within 1 hr and was feeling nauseated. She has on a depends now.   denies LOF, VB, contractions, vaginal discharge.  OB History     Gravida  9   Para  5   Term  5   Preterm  0   AB  2   Living  5      SAB  2   IAB  0   Ectopic  0   Multiple  0   Live Births  5           Past Medical History:  Diagnosis Date   Anemia    Anxiety    Cervical dysplasia    Depression    pp for 3 mos; was on medication   Genital warts    h/o cryo    Menorrhagia    Panic attacks    Vaginal Pap smear, abnormal     Past Surgical History:  Procedure Laterality Date   CERVICAL BIOPSY  W/ LOOP ELECTRODE EXCISION     DENTAL SURGERY  July 2014, September 2013   wisdom tooth extraction; other oral surgery   DILATION AND CURETTAGE OF UTERUS N/A 04/29/2021   Procedure: DILATATION AND EVACUATION UNDER ULTRASOUND GUIDANCE;  Surgeon: Rande Brunt, MD;  Location: [facility] OR;  Service: Gynecology;  Laterality: N/A;   DILATION AND EVACUATION N/A 08/02/2022   Procedure: SUCTION DILATATION AND EVACUATION, INSERTION OF UTERINE BALLOON;  Surgeon: Tereso Newcomer, MD;  Location: [facility] OR;  Service: Gynecology;  Laterality: N/A;    Family History  Problem Relation Age of Onset   Healthy Mother    Colon cancer  Father 19       died at age 4    Prader-Willi syndrome Sister    Diabetes Paternal Grandmother     Social History   Tobacco Use   Smoking status: Never   Smokeless tobacco: Never  Vaping Use   Vaping status: Never Used  Substance Use Topics   Alcohol use: Not Currently   Drug use: No    Allergies: No Known Allergies  No medications prior to admission.    Review of Systems  Constitutional:  Negative for chills and fever.  HENT:  Negative for congestion and sore throat.   Eyes:  Negative for pain and visual disturbance.  Respiratory:  Negative for cough, chest tightness and shortness of breath.   Cardiovascular:  Negative for chest pain.  Gastrointestinal:  Positive for abdominal pain. Negative for diarrhea, nausea and vomiting.  Endocrine: Negative for cold intolerance and heat intolerance.  Genitourinary:  Positive for vaginal bleeding. Negative for dysuria and flank pain.  Musculoskeletal:  Negative for back pain.  Skin:  Negative for rash.  Allergic/Immunologic: Negative for food allergies.  Neurological:  Negative for dizziness and light-headedness.  Psychiatric/Behavioral:  Negative for agitation.  Physical Exam   Blood pressure 127/81, pulse 69, temperature 97.9 F (36.6 C), temperature source Oral, resp. rate 19, height 5\' 3"  (1.6 m), weight 89.4 kg, last menstrual period 10/01/2023, SpO2 100%, unknown if currently breastfeeding.  Physical Exam Vitals and nursing note reviewed. Exam conducted with a chaperone present.  Constitutional:      General: She is not in acute distress.    Appearance: She is well-developed.  HENT:     Head: Normocephalic and atraumatic.  Eyes:     General: No scleral icterus.    Conjunctiva/sclera: Conjunctivae normal.  Cardiovascular:     Rate and Rhythm: Normal rate.  Pulmonary:     Effort: Pulmonary effort is normal.  Chest:     Chest wall: No tenderness.  Abdominal:     Palpations: Abdomen is soft.     Tenderness:  There is no abdominal tenderness. There is no guarding or rebound.  Genitourinary:    Exam position: Lithotomy position.     Vagina: Bleeding present.     Cervix: Normal.     Uterus: Enlarged and tender.      Adnexa:        Right: No tenderness.         Left: No tenderness.       Comments: + bleeding vault, dark blood. Coming from cervix which is visually closed and no tissue actively passing. Minimal bleeding on her diaper.  Musculoskeletal:        General: Normal range of motion.     Cervical back: Normal range of motion and neck supple.  Skin:    General: Skin is warm and dry.     Findings: No rash.  Neurological:     Mental Status: She is alert and oriented to person, place, and time.     MAU Course  Procedures  MDM: high  This patient presents to the ED for concern of   Chief Complaint  Patient presents with   Abdominal Pain   Vaginal Bleeding     These complains involves an extensive number of treatment options, and is a complaint that carries with it a high risk of complications and morbidity.  The differential diagnosis for  1.vaginal bleeding in early pregnancy INCLUDES threatened miscarriage, ectopic pregnancy (unless IUP confirmed), normal variant bleeding with live IUP-mostly likely subchorionic hemorrhage in this case. Most likely for this patient is Failed pregnancy due to inducted abortion.     Co morbidities that complicate the patient evaluation: Rh negative  External records from outside source obtained and reviewed including Scanned media records, CareEverywhere, and Prenatal care records   I ordered, and personally interpreted labs.  The pertinent results include:   Results for orders placed or performed during the hospital encounter of 11/21/23 (from the past 24 hours)  Pregnancy, urine POC     Status: Abnormal   Collection Time: 11/21/23  1:57 PM  Result Value Ref Range   Preg Test, Ur POSITIVE (A) NEGATIVE  Urinalysis, Routine w reflex  microscopic -Urine, Clean Catch     Status: Abnormal   Collection Time: 11/21/23  2:12 PM  Result Value Ref Range   Color, Urine YELLOW YELLOW   APPearance CLOUDY (A) CLEAR   Specific Gravity, Urine 1.021 1.005 - 1.030   pH 7.0 5.0 - 8.0   Glucose, UA NEGATIVE NEGATIVE mg/dL   Hgb urine dipstick LARGE (A) NEGATIVE   Bilirubin Urine NEGATIVE NEGATIVE   Ketones, ur NEGATIVE NEGATIVE mg/dL   Protein, ur 474 (A)  NEGATIVE mg/dL   Nitrite NEGATIVE NEGATIVE   Leukocytes,Ua TRACE (A) NEGATIVE   RBC / HPF >50 0 - 5 RBC/hpf   WBC, UA 0-5 0 - 5 WBC/hpf   Bacteria, UA NONE SEEN NONE SEEN   Squamous Epithelial / HPF 0-5 0 - 5 /HPF   Mucus PRESENT   ABO/Rh     Status: None   Collection Time: 11/21/23  2:15 PM  Result Value Ref Range   ABO/RH(D) O NEG    Antibody Screen POS    Antibody Identification      ANTI D Performed at [facility], [address], [place], [place] [address]   CBC     Status: None   Collection Time: 11/21/23  2:37 PM  Result Value Ref Range   WBC 7.6 4.0 - 10.5 K/uL   RBC 4.47 3.87 - 5.11 MIL/uL   Hemoglobin 12.7 12.0 - 15.0 g/dL   HCT 60.4 54.0 - 98.1 %   MCV 87.9 80.0 - 100.0 fL   MCH 28.4 26.0 - 34.0 pg   MCHC 32.3 30.0 - 36.0 g/dL   RDW 19.1 47.8 - 29.5 %   Platelets 255 150 - 400 K/uL   nRBC 0.0 0.0 - 0.2 %  hCG, quantitative, pregnancy     Status: Abnormal   Collection Time: 11/21/23  2:37 PM  Result Value Ref Range   hCG, Beta Chain, Quant, S 1,768 (H) <5 mIU/mL    Imaging Studies ordered:  I ordered imaging studies includingTransvaginal [place] I independently visualized and interpreted imaging which showed samll gestational sac I agree with the radiologist interpretation  US OB LESS THAN 14 WEEKS WITH OB TRANSVAGINAL Result Date: 11/21/2023 CLINICAL DATA:  Vaginal bleeding.  Pregnant EXAM: OBSTETRIC <14 WK [place] AND TRANSVAGINAL OB US TECHNIQUE: Both transabdominal and transvaginal ultrasound examinations were performed for complete evaluation of  the gestation as well as the maternal uterus, adnexal regions, and pelvic cul-de-sac. Transvaginal technique was performed to assess early pregnancy. COMPARISON:  None Available. FINDINGS: Transabdominal images are essentially nondiagnostic with overlapping bowel gas, soft tissue and a contracted urinary bladder. Uterus: Retroverted uterus identified with some heterogeneous myometrium. Endometrial stripe measured up to 12 mm in thickness with a well-defined cystic area in the endometrium measuring 7 by full by 6 mm. Small yolk sac identified. No fetal pole. Ovaries: Left ovary has follicles and measures 3.4 x 2.5 x 3.5 cm. The right over the appears similar the but smaller measuring 2.0 x 1.9 x 1.7 cm today. Trace free fluid in the pelvis. IMPRESSION: The early IUP identified with a yolk sac. No fetal pole seen at this time. Based on mean sac diameter gestational age of [redacted] weeks and 2 days. Recommend continued follow-up with serial beta HCG and ultrasound. Trace free fluid in the pelvis. Retroverted uterus. Nondiagnostic transabdominal portion of the examination as above Electronically Signed   By: Karen Kays M.D.   On: 11/21/2023 16:58   Medicines ordered and prescription drug management:  Medications: Percocet, Zofran   Reevaluation of the patient after these medicines showed that the patient improved I have reviewed the patients home medicines and have made adjustments as needed   MAU Course: 16:30 I went to the room to review with the patient her results and need for repeat BHCG and suspected but not diagnosable failed pregnancy. She then stated "but I know it is not a progressing pregnancy."  Patient then disclosed she had sought medical abortion. She was seen on  2/4 for initial consult at [facility] and then took mifepristone on 2/8 and misoprostol 2/9. She had contacted the nurse line at [facility] and they recommended evaluation here.  She was tearful as the situation with her pregnancy  is complex. We discussed her situation at length and I provided support.  I reviewed that I cannot medically confirm a failed pregnancy at this point. I reviewed her [facility] records and she did not have a beta HCG drawn there.   I did confirm she received rhogam on 2/8 which she took the mifepristone.   After the interventions noted above, I reevaluated the patient and found that they have :improved  Dispostion: discharged   Assessment and Plan   1. Bleeding in early pregnancy   2. [redacted] weeks gestation of pregnancy    - US showed a gestational sac with elevated BHCG - Will get repeat BHCG tomorrow - If dropping or stable bhcg then we will plan on D&E - Patient is Rh negative, will NOT need rhogam. She received it on 2/8 at [facility] - Counseled on returning for severe pain, increased bleeding or any other concern - Reviewed she might continue to pass the pregnancy - Provided rx for Percocet #3 and Ativan #3  - Provided flexeril to help with muscle cramping   Allergies as of 11/21/2023   No Known Allergies      Medication List     STOP taking these medications    ALPRAZolam 1 MG tablet Commonly known as: Xanax   busPIRone 10 MG tablet Commonly known as: BUSPAR   docusate sodium 100 MG capsule Commonly known as: COLACE   gabapentin 300 MG capsule Commonly known as: NEURONTIN   hydrOXYzine 25 MG tablet Commonly known as: ATARAX   naproxen 500 MG tablet Commonly known as: NAPROSYN   potassium chloride SA 20 MEQ tablet Commonly known as: KLOR-CON M   sertraline 25 MG tablet Commonly known as: Zoloft   Xulane 150-35 MCG/24HR transdermal patch Generic drug: norelgestromin-ethinyl estradiol       TAKE these medications    Acetaminophen Extra Strength 500 MG Tabs Take 2 tablets (1,000 mg total) by mouth every 6 (six) hours as needed for mild pain.   cyclobenzaprine 10 MG tablet Commonly known as: FLEXERIL Take 1 tablet (10 mg total) by mouth 2  (two) times daily as needed for muscle spasms. What changed: You were already taking a medication with the same name, and this prescription was added. Make sure you understand how and when to take each.   cyclobenzaprine 10 MG tablet Commonly known as: FLEXERIL Take 1 tablet (10 mg total) by mouth 2 (two) times daily as needed for muscle spasms. What changed: Another medication with the same name was added. Make sure you understand how and when to take each.   ibuprofen 600 MG tablet Commonly known as: ADVIL Take 1 tablet (600 mg total) by mouth every 6 (six) hours as needed for moderate pain or headache.   LORazepam 0.5 MG tablet Commonly known as: Ativan Take 2 tablets (1 mg total) by mouth 3 (three) times daily as needed for anxiety.   oxyCODONE-acetaminophen 5-325 MG tablet Commonly known as: PERCOCET/ROXICET Take 1 tablet by mouth every 6 (six) hours as needed for severe pain (pain score 7-10).        Future Appointments  Date Time Provider Department Center  11/22/2023 10:35 AM [facility] FU [facility]    Isa Rankin Yaneliz Radebaugh 11/21/2023, 9:03 PM

## 2023-11-21 NOTE — MAU Note (Signed)
Morgan Quinn is a 38 y.o. at Unknown here in MAU reporting: she is having a "miscarriage", reports she woke having VB with small blood clots and cramping.  States she is now wearing a depend, but saturated a sanitary napkin earlier.  Reports took Ibuprofen 800 mg @ 0600 this morning, no relief noted.    LMP: 10/01/2023 Onset of complaint: today Pain score: 10 Vitals:   11/21/23 1345 11/21/23 1348  BP: 119/83 (!) 125/91  Pulse: 76 89  Resp:    Temp:    SpO2: 100% 100%     FHT: NA  Lab orders placed from triage: UPT

## 2023-11-21 NOTE — Discharge Instructions (Signed)
Return to the MAU for heavy bleeding that fills a pad, worsening pain, dizziness and lightheadness or other concern

## 2023-11-22 ENCOUNTER — Encounter (HOSPITAL_COMMUNITY): Payer: Self-pay | Admitting: Obstetrics and Gynecology

## 2023-11-22 ENCOUNTER — Other Ambulatory Visit: Payer: Self-pay

## 2023-11-22 ENCOUNTER — Encounter: Payer: Self-pay | Admitting: *Deleted

## 2023-11-22 ENCOUNTER — Encounter: Payer: Self-pay | Admitting: Family Medicine

## 2023-11-22 ENCOUNTER — Ambulatory Visit: Payer: Self-pay

## 2023-11-22 ENCOUNTER — Other Ambulatory Visit: Payer: Self-pay | Admitting: Family Medicine

## 2023-11-22 VITALS — BP 106/72 | HR 64

## 2023-11-22 DIAGNOSIS — O039 Complete or unspecified spontaneous abortion without complication: Secondary | ICD-10-CM

## 2023-11-22 DIAGNOSIS — O3680X0 Pregnancy with inconclusive fetal viability, not applicable or unspecified: Secondary | ICD-10-CM

## 2023-11-22 DIAGNOSIS — O034 Incomplete spontaneous abortion without complication: Secondary | ICD-10-CM

## 2023-11-22 LAB — GC/CHLAMYDIA PROBE AMP (~~LOC~~) NOT AT ARMC
Chlamydia: NEGATIVE
Comment: NEGATIVE
Comment: NORMAL
Neisseria Gonorrhea: NEGATIVE

## 2023-11-22 LAB — ABO/RH
ABO/RH(D): O NEG
Antibody Screen: POSITIVE

## 2023-11-22 LAB — BETA HCG QUANT (REF LAB): hCG Quant: 1286 m[IU]/mL

## 2023-11-22 NOTE — Progress Notes (Signed)
Called pt to discuss test results and plan of care and she did not answer. Pt does not have voicemail set up. Per chart review, pt has read Mychart message from Dr. Alvester Morin pertaining to test results and plan for D&E.  Pt has been scheduled for surgery tomorrow @1 :30 and instructions were sent via Mychart by Donne Hazel. Pt has not yet seen the message.  I sent a message as well regarding her plan and to arrive @ [facility] @ 11:30 unless she is called and told different information.

## 2023-11-22 NOTE — Progress Notes (Signed)
SDW CALL  Patient was given pre-op instructions over the phone. The opportunity was given for the patient to ask questions. No further questions asked. Patient verbalized understanding of instructions given.   PCP - Patient, No Pcp Per  Cardiologist -   PPM/ICD - denies Device Orders - n/a Rep Notified - n/a  Chest x-ray - denies EKG - denies Stress Test - denies ECHO - denies Cardiac Cath - denies  Sleep Study - denies   DM- denies   Blood Thinner Instructions: denies Aspirin Instructions:n/a  ERAS Protcol -clear liquids until 1045    COVID TEST- n/a   Anesthesia review: no  Patient denies shortness of breath, fever, cough and chest pain over the phone call   All instructions explained to the patient, with a verbal understanding of the material. Patient agrees to go over the instructions while at home for a better understanding.

## 2023-11-22 NOTE — Progress Notes (Signed)
Here for stat bhcg. Reports still bleeding heavy like a heavy period same as when in MAU yesterday. Reports pain is still same as yesterday =10 severe cramping, dull sharp pain at times. Asked to lie down. BP 93/53, repeated sitting up = 106/72. Reports took percocet last 0630. BHCG drawn and explained we will call her with results and plan of care. Reviewed assessment with Dr. Alvester Morin and she came in to discuss plan with patient. Nancy Fetter

## 2023-11-23 ENCOUNTER — Ambulatory Visit (HOSPITAL_COMMUNITY): Payer: Medicaid Other | Admitting: Anesthesiology

## 2023-11-23 ENCOUNTER — Ambulatory Visit (HOSPITAL_COMMUNITY)
Admission: RE | Admit: 2023-11-23 | Discharge: 2023-11-23 | Disposition: A | Discharge status: Home / Self Care | Payer: Medicaid Other | Source: Ambulatory Visit | Attending: Obstetrics and Gynecology | Admitting: Obstetrics and Gynecology

## 2023-11-23 ENCOUNTER — Ambulatory Visit: Admit: 2023-11-23 | Payer: Medicaid Other | Admitting: Obstetrics and Gynecology

## 2023-11-23 ENCOUNTER — Encounter (HOSPITAL_COMMUNITY)
Admission: RE | Disposition: A | Discharge status: Home / Self Care | Payer: Self-pay | Source: Ambulatory Visit | Attending: Obstetrics and Gynecology

## 2023-11-23 ENCOUNTER — Ambulatory Visit (HOSPITAL_BASED_OUTPATIENT_CLINIC_OR_DEPARTMENT_OTHER): Payer: Medicaid Other | Admitting: Anesthesiology

## 2023-11-23 ENCOUNTER — Other Ambulatory Visit: Payer: Self-pay

## 2023-11-23 DIAGNOSIS — N96 Recurrent pregnancy loss: Secondary | ICD-10-CM

## 2023-11-23 DIAGNOSIS — Z3A00 Weeks of gestation of pregnancy not specified: Secondary | ICD-10-CM | POA: Diagnosis not present

## 2023-11-23 DIAGNOSIS — O021 Missed abortion: Secondary | ICD-10-CM | POA: Diagnosis present

## 2023-11-23 DIAGNOSIS — F32A Depression, unspecified: Secondary | ICD-10-CM | POA: Diagnosis not present

## 2023-11-23 DIAGNOSIS — O26899 Other specified pregnancy related conditions, unspecified trimester: Secondary | ICD-10-CM

## 2023-11-23 DIAGNOSIS — D649 Anemia, unspecified: Secondary | ICD-10-CM | POA: Insufficient documentation

## 2023-11-23 DIAGNOSIS — O0730 Failed attempted termination of pregnancy with unspecified complications: Secondary | ICD-10-CM | POA: Diagnosis present

## 2023-11-23 DIAGNOSIS — F419 Anxiety disorder, unspecified: Secondary | ICD-10-CM | POA: Diagnosis not present

## 2023-11-23 DIAGNOSIS — Z6834 Body mass index (BMI) 34.0-34.9, adult: Secondary | ICD-10-CM | POA: Insufficient documentation

## 2023-11-23 DIAGNOSIS — E669 Obesity, unspecified: Secondary | ICD-10-CM | POA: Diagnosis not present

## 2023-11-23 DIAGNOSIS — Z3A01 Less than 8 weeks gestation of pregnancy: Secondary | ICD-10-CM

## 2023-11-23 DIAGNOSIS — O074 Failed attempted termination of pregnancy without complication: Secondary | ICD-10-CM

## 2023-11-23 DIAGNOSIS — O034 Incomplete spontaneous abortion without complication: Secondary | ICD-10-CM

## 2023-11-23 DIAGNOSIS — Z6791 Unspecified blood type, Rh negative: Secondary | ICD-10-CM

## 2023-11-23 HISTORY — PX: DILATION AND EVACUATION: SHX1459

## 2023-11-23 LAB — TYPE AND SCREEN: ABO/RH(D): O NEG

## 2023-11-23 LAB — CBC
HCT: 37.6 % (ref 36.0–46.0)
Hemoglobin: 11.8 g/dL — ABNORMAL LOW (ref 12.0–15.0)
MCH: 28.1 pg (ref 26.0–34.0)
MCHC: 31.4 g/dL (ref 30.0–36.0)
MCV: 89.5 fL (ref 80.0–100.0)
Platelets: 239 10*3/uL (ref 150–400)
RBC: 4.2 MIL/uL (ref 3.87–5.11)
RDW: 13.2 % (ref 11.5–15.5)
WBC: 5.3 10*3/uL (ref 4.0–10.5)
nRBC: 0 % (ref 0.0–0.2)

## 2023-11-23 SURGERY — DILATION AND EVACUATION, UTERUS
Anesthesia: General | Site: Uterus

## 2023-11-23 SURGERY — DILATION AND EVACUATION, UTERUS
Anesthesia: Choice

## 2023-11-23 MED ORDER — LACTATED RINGERS IV SOLN
INTRAVENOUS | Status: DC
Start: 2023-11-23 — End: 2023-11-23

## 2023-11-23 MED ORDER — LORAZEPAM 0.5 MG PO TABS: 0.5000 mg | ORAL_TABLET | Freq: Three times a day (TID) | ORAL | 0 refills | Status: AC

## 2023-11-23 MED ORDER — OXYCODONE-ACETAMINOPHEN 5-325 MG PO TABS: 1.0000 | ORAL_TABLET | Freq: Four times a day (QID) | ORAL | 0 refills | Status: AC | PRN

## 2023-11-23 MED ORDER — FENTANYL CITRATE (PF) 250 MCG/5ML IJ SOLN
INTRAMUSCULAR | Status: DC | PRN
Start: 2023-11-23 — End: 2023-11-23
  Administered 2023-11-23 (×2): 50 ug via INTRAVENOUS

## 2023-11-23 MED ORDER — ONDANSETRON HCL 4 MG/2ML IJ SOLN
INTRAMUSCULAR | Status: DC | PRN
  Administered 2023-11-23: 4 mg via INTRAVENOUS

## 2023-11-23 MED ORDER — CHLORHEXIDINE GLUCONATE 0.12 % MT SOLN
OROMUCOSAL | Status: AC
Start: 2023-11-23 — End: 2023-11-23
  Administered 2023-11-23: 15 mL via OROMUCOSAL
  Filled 2023-11-23: qty 15

## 2023-11-23 MED ORDER — CHLORHEXIDINE GLUCONATE 0.12 % MT SOLN: 15.0000 mL | Freq: Once | OROMUCOSAL | Status: AC

## 2023-11-23 MED ORDER — LIDOCAINE HCL (PF) 1 % IJ SOLN
INTRAMUSCULAR | Status: DC | PRN
  Administered 2023-11-23: 20 mL

## 2023-11-23 MED ORDER — ACETAMINOPHEN 500 MG PO TABS: 1000.0000 mg | ORAL_TABLET | Freq: Once | ORAL | Status: DC

## 2023-11-23 MED ORDER — LIDOCAINE 2% (20 MG/ML) 5 ML SYRINGE
INTRAMUSCULAR | Status: DC | PRN
  Administered 2023-11-23: 100 mg via INTRAVENOUS

## 2023-11-23 MED ORDER — MIDAZOLAM HCL 2 MG/2ML IJ SOLN
INTRAMUSCULAR | Status: AC
  Filled 2023-11-23: qty 2

## 2023-11-23 MED ORDER — DEXAMETHASONE SODIUM PHOSPHATE 10 MG/ML IJ SOLN
INTRAMUSCULAR | Status: DC | PRN
  Administered 2023-11-23: 10 mg via INTRAVENOUS

## 2023-11-23 MED ORDER — ORAL CARE MOUTH RINSE: 15.0000 mL | Freq: Once | OROMUCOSAL | Status: AC

## 2023-11-23 MED ORDER — PROPOFOL 10 MG/ML IV BOLUS
INTRAVENOUS | Status: DC | PRN
  Administered 2023-11-23: 200 mg via INTRAVENOUS

## 2023-11-23 MED ORDER — ACETAMINOPHEN 500 MG PO TABS
1000.0000 mg | ORAL_TABLET | ORAL | Status: AC
  Administered 2023-11-23: 1000 mg via ORAL
  Filled 2023-11-23: qty 2

## 2023-11-23 MED ORDER — MIDAZOLAM HCL 2 MG/2ML IJ SOLN
INTRAMUSCULAR | Status: DC | PRN
  Administered 2023-11-23: 2 mg via INTRAVENOUS

## 2023-11-23 MED ORDER — FENTANYL CITRATE (PF) 100 MCG/2ML IJ SOLN: 25.0000 ug | INTRAMUSCULAR | Status: DC | PRN

## 2023-11-23 MED ORDER — ACETAMINOPHEN 500 MG PO TABS
1000.0000 mg | ORAL_TABLET | Freq: Once | ORAL | Status: DC
Start: 2023-11-23 — End: 2023-11-23

## 2023-11-23 MED ORDER — POVIDONE-IODINE 10 % EX SWAB
2.0000 | Freq: Once | CUTANEOUS | Status: AC
  Administered 2023-11-23: 2 via TOPICAL

## 2023-11-23 MED ORDER — LIDOCAINE HCL (PF) 1 % IJ SOLN
INTRAMUSCULAR | Status: AC
  Filled 2023-11-23: qty 30

## 2023-11-23 MED ORDER — DOXYCYCLINE HYCLATE 100 MG IV SOLR
200.0000 mg | Freq: Once | INTRAVENOUS | Status: AC
  Administered 2023-11-23: 200 mg via INTRAVENOUS
  Filled 2023-11-23: qty 200

## 2023-11-23 MED ORDER — LACTATED RINGERS IV SOLN: INTRAVENOUS | Status: DC | PRN

## 2023-11-23 MED ORDER — FENTANYL CITRATE (PF) 250 MCG/5ML IJ SOLN
INTRAMUSCULAR | Status: AC
  Filled 2023-11-23: qty 5

## 2023-11-23 SURGICAL SUPPLY — 20 items
CATH ROBINSON RED A/P 16FR (CATHETERS) ×1 IMPLANT
FILTER UTR ASPR ASSEMBLY (MISCELLANEOUS) ×1 IMPLANT
GAUZE 4X4 16PLY ~~LOC~~+RFID DBL (SPONGE) ×1 IMPLANT
GLOVE BIO SURGEON STRL SZ 6 (GLOVE) ×1 IMPLANT
GLOVE BIOGEL PI IND STRL 7.0 (GLOVE) ×1 IMPLANT
GOWN STRL REUS W/ TWL LRG LVL3 (GOWN DISPOSABLE) ×2 IMPLANT
HOSE CONNECTING 18IN BERKELEY (TUBING) ×1 IMPLANT
KIT BERKELEY 1ST TRI 3/8 NO TR (MISCELLANEOUS) ×1 IMPLANT
KIT BERKELEY 1ST TRIMESTER 3/8 (MISCELLANEOUS) ×1 IMPLANT
NS IRRIG 1000ML POUR BTL (IV SOLUTION) ×1 IMPLANT
PACK VAGINAL MINOR WOMEN LF (CUSTOM PROCEDURE TRAY) ×1 IMPLANT
PAD OB MATERNITY 4.3X12.25 (PERSONAL CARE ITEMS) ×1 IMPLANT
SET BERKELEY SUCTION TUBING (SUCTIONS) ×1 IMPLANT
SPIKE FLUID TRANSFER (MISCELLANEOUS) ×1 IMPLANT
TOWEL GREEN STERILE FF (TOWEL DISPOSABLE) ×1 IMPLANT
UNDERPAD 30X36 HEAVY ABSORB (UNDERPADS AND DIAPERS) ×1 IMPLANT
VACURETTE 10 RIGID CVD (CANNULA) IMPLANT
VACURETTE 7MM CVD STRL WRAP (CANNULA) IMPLANT
VACURETTE 8 RIGID CVD (CANNULA) IMPLANT
VACURETTE 9 RIGID CVD (CANNULA) IMPLANT

## 2023-11-23 NOTE — Anesthesia Procedure Notes (Signed)
Procedure Name: LMA Insertion Date/Time: 11/23/2023 1:48 PM  Performed by: Allyn Kenner, CRNAPre-anesthesia Checklist: Patient identified, Emergency Drugs available, Suction available and Patient being monitored Patient Re-evaluated:Patient Re-evaluated prior to induction Oxygen Delivery Method: Circle System Utilized Preoxygenation: Pre-oxygenation with 100% oxygen Induction Type: IV induction Ventilation: Mask ventilation without difficulty LMA: LMA inserted LMA Size: 4.0 Number of attempts: 1 Airway Equipment and Method: Bite block Placement Confirmation: positive ETCO2 Tube secured with: Tape Dental Injury: Teeth and Oropharynx as per pre-operative assessment

## 2023-11-23 NOTE — H&P (Signed)
Faculty Practice Obstetrics and Gynecology Attending History and Physical  Morgan Quinn is a 38 y.o. [id number] who presented to [facility] today for surgery following unsuccessful termination of pregnancy with medical management. She has a declining HCG so no longer consistent with viable pregnancy.     Past Medical History:  Diagnosis Date   Anemia    Anxiety    Cervical dysplasia    Depression    pp for 3 mos; was on medication   Genital warts    h/o cryo    Menorrhagia    Panic attacks    Vaginal Pap smear, abnormal    Past Surgical History:  Procedure Laterality Date   CERVICAL BIOPSY  W/ LOOP ELECTRODE EXCISION     DENTAL SURGERY  July 2014, September 2013   wisdom tooth extraction; other oral surgery   DILATION AND CURETTAGE OF UTERUS N/A 04/29/2021   Procedure: DILATATION AND EVACUATION UNDER ULTRASOUND GUIDANCE;  Surgeon: Rande Brunt, MD;  Location: [facility] OR;  Service: Gynecology;  Laterality: N/A;   DILATION AND EVACUATION N/A 08/02/2022   Procedure: SUCTION DILATATION AND EVACUATION, INSERTION OF UTERINE BALLOON;  Surgeon: Tereso Newcomer, MD;  Location: [facility] OR;  Service: Gynecology;  Laterality: N/A;   OB History  Gravida Para Term Preterm AB Living  9 5 5  0 2 5  SAB IAB Ectopic Multiple Live Births  2 0 0 0 5    # Outcome Date GA Lbr Len/2nd Weight Sex Type Anes PTL Lv  9 Current           8 SAB 09/2021          7 SAB 04/2021          6 Term 07/24/19 107w2d 03:29 / 00:19 3249 g M Vag-Spont EPI  LIV  5 Term 08/12/15 [redacted]w[redacted]d 11:45 / 00:17 3079 g M Vag-Spont EPI  LIV  4 Term 10/24/13 [redacted]w[redacted]d 12:22 / 00:18 3459 g M Vag-Spont EPI  LIV  3 Term 03/07/12 [redacted]w[redacted]d 19:30 / 00:30 2614 g M Vag-Spont EPI  LIV  2 Term 05/24/07 [redacted]w[redacted]d 24:00 2551 g F Vag-Spont EPI  LIV  1 Gravida           Patient denies any other pertinent gynecologic issues.  No current facility-administered medications on file prior to encounter.   Current Outpatient Medications on File Prior to  Encounter  Medication Sig Dispense Refill   acetaminophen (TYLENOL) 500 MG tablet Take 2 tablets (1,000 mg total) by mouth every 6 (six) hours as needed for mild pain. 60 tablet 2   cyclobenzaprine (FLEXERIL) 10 MG tablet Take 1 tablet (10 mg total) by mouth 2 (two) times daily as needed for muscle spasms. 20 tablet 0   LORazepam (ATIVAN) 0.5 MG tablet Take 2 tablets (1 mg total) by mouth 3 (three) times daily as needed for anxiety. 3 tablet 0   oxyCODONE-acetaminophen (PERCOCET/ROXICET) 5-325 MG tablet Take 1 tablet by mouth every 6 (six) hours as needed for severe pain (pain score 7-10). 3 tablet 0   cyclobenzaprine (FLEXERIL) 10 MG tablet Take 1 tablet (10 mg total) by mouth 2 (two) times daily as needed for muscle spasms. (Patient not taking: Reported on 11/22/2023) 20 tablet 0   No Known Allergies  Social History:   reports that she has never smoked. She has never used smokeless tobacco. She reports that she does not currently use alcohol. She reports that she does not use drugs. Family History  Problem Relation Age of  Onset   Healthy Mother    Colon cancer Father 50       died at age 14    Prader-Willi syndrome Sister    Diabetes Paternal Grandmother     Review of Systems: Pertinent items noted in HPI and remainder of comprehensive ROS otherwise negative.  PHYSICAL EXAM: Blood pressure 118/88, pulse 75, temperature 97.8 F (36.6 C), resp. rate 18, height 5\' 3"  (1.6 m), weight 89.4 kg, last menstrual period 10/01/2023, SpO2 99%, unknown if currently breastfeeding. CONSTITUTIONAL: Well-developed, well-nourished female in no acute distress.  HENT:  Normocephalic, atraumatic, External right and left ear normal. Oropharynx is clear and moist EYES: Conjunctivae and EOM are normal. Pupils are equal, round, and reactive to light. No scleral icterus.  NECK: Normal range of motion, supple, no masses SKIN: Skin is warm and dry. No rash noted. Not diaphoretic. No erythema. No  pallor. NEUROLOGIC: Alert and oriented to person, place, and time. Normal reflexes, muscle tone coordination. No cranial nerve deficit noted. PSYCHIATRIC: Normal mood and affect. Normal behavior. Normal judgment and thought content. CARDIOVASCULAR: Normal heart rate noted, regular rhythm RESPIRATORY: Effort and breath sounds normal, no problems with respiration noted ABDOMEN: Soft, nontender, nondistended. PELVIC: Not examined MUSCULOSKELETAL: Normal range of motion. No tenderness.  No cyanosis, clubbing, or edema.  2+ distal pulses.  Labs: Results for orders placed or performed during the hospital encounter of 11/23/23 (from the past 2 weeks)  CBC per protocol   Collection Time: 11/23/23 12:52 PM  Result Value Ref Range   WBC 5.3 4.0 - 10.5 K/uL   RBC 4.20 3.87 - 5.11 MIL/uL   Hemoglobin 11.8 (L) 12.0 - 15.0 g/dL   HCT 16.1 09.6 - 04.5 %   MCV 89.5 80.0 - 100.0 fL   MCH 28.1 26.0 - 34.0 pg   MCHC 31.4 30.0 - 36.0 g/dL   RDW 40.9 81.1 - 91.4 %   Platelets 239 150 - 400 K/uL   nRBC 0.0 0.0 - 0.2 %  Results for orders placed or performed in visit on 11/22/23 (from the past 2 weeks)  Beta hCG quant (ref lab)   Collection Time: 11/22/23 12:26 PM  Result Value Ref Range   hCG Quant 1,286 mIU/mL  Results for orders placed or performed during the hospital encounter of 11/21/23 (from the past 2 weeks)  Pregnancy, urine POC   Collection Time: 11/21/23  1:57 PM  Result Value Ref Range   Preg Test, Ur POSITIVE (A) NEGATIVE  GC/Chlamydia probe amp ([place])not at [facility]   Collection Time: 11/21/23  2:07 PM  Result Value Ref Range   Neisseria Gonorrhea Negative    Chlamydia Negative    Comment Normal Reference [place] Chlamydia - Negative    Comment      Normal Reference Range Neisseria Gonorrhea - Negative  Urinalysis, Routine w reflex microscopic -Urine, Clean Catch   Collection Time: 11/21/23  2:12 PM  Result Value Ref Range   Color, Urine YELLOW YELLOW   APPearance CLOUDY (A)  CLEAR   Specific Gravity, Urine 1.021 1.005 - 1.030   pH 7.0 5.0 - 8.0   Glucose, UA NEGATIVE NEGATIVE mg/dL   Hgb urine dipstick LARGE (A) NEGATIVE   Bilirubin Urine NEGATIVE NEGATIVE   Ketones, ur NEGATIVE NEGATIVE mg/dL   Protein, ur 782 (A) NEGATIVE mg/dL   Nitrite NEGATIVE NEGATIVE   Leukocytes,Ua TRACE (A) NEGATIVE   RBC / HPF >50 0 - 5 RBC/hpf   WBC, UA 0-5 0 - 5 WBC/hpf  Bacteria, UA NONE SEEN NONE SEEN   Squamous Epithelial / HPF 0-5 0 - 5 /HPF   Mucus PRESENT   ABO/Rh   Collection Time: 11/21/23  2:15 PM  Result Value Ref Range   ABO/RH(D) O NEG    Antibody Screen POS    Antibody Identification      PASSIVELY ACQUIRED ANTI-D Performed at [facility], [address], [place], [place] [address]   CBC   Collection Time: 11/21/23  2:37 PM  Result Value Ref Range   WBC 7.6 4.0 - 10.5 K/uL   RBC 4.47 3.87 - 5.11 MIL/uL   Hemoglobin 12.7 12.0 - 15.0 g/dL   HCT 96.2 95.2 - 84.1 %   MCV 87.9 80.0 - 100.0 fL   MCH 28.4 26.0 - 34.0 pg   MCHC 32.3 30.0 - 36.0 g/dL   RDW 32.4 40.1 - 02.7 %   Platelets 255 150 - 400 K/uL   nRBC 0.0 0.0 - 0.2 %  hCG, quantitative, pregnancy   Collection Time: 11/21/23  2:37 PM  Result Value Ref Range   hCG, Beta Chain, Quant, S 1,768 (H) <5 mIU/mL  HCG yesterday - 1286  Imaging Studies: US OB LESS THAN 14 WEEKS WITH OB TRANSVAGINAL Result Date: 11/21/2023 CLINICAL DATA:  Vaginal bleeding.  Pregnant EXAM: OBSTETRIC <14 WK [place] AND TRANSVAGINAL OB US TECHNIQUE: Both transabdominal and transvaginal ultrasound examinations were performed for complete evaluation of the gestation as well as the maternal uterus, adnexal regions, and pelvic cul-de-sac. Transvaginal technique was performed to assess early pregnancy. COMPARISON:  None Available. FINDINGS: Transabdominal images are essentially nondiagnostic with overlapping bowel gas, soft tissue and a contracted urinary bladder. Uterus: Retroverted uterus identified with some heterogeneous  myometrium. Endometrial stripe measured up to 12 mm in thickness with a well-defined cystic area in the endometrium measuring 7 by full by 6 mm. Small yolk sac identified. No fetal pole. Ovaries: Left ovary has follicles and measures 3.4 x 2.5 x 3.5 cm. The right over the appears similar the but smaller measuring 2.0 x 1.9 x 1.7 cm today. Trace free fluid in the pelvis. IMPRESSION: The early IUP identified with a yolk sac. No fetal pole seen at this time. Based on mean sac diameter gestational age of [redacted] weeks and 2 days. Recommend continued follow-up with serial beta HCG and ultrasound. Trace free fluid in the pelvis. Retroverted uterus. Nondiagnostic transabdominal portion of the examination as above Electronically Signed   By: Karen Kays M.D.   On: 11/21/2023 16:58    Assessment: Active Problems:   Rh negative status during pregnancy   Failed attempted termination of pregnancy w unsp comp   Plan: - We discussed genetics: we reviewed the benefits - she Declines - Risks of surgery include but are not limited to: bleeding, infection, injury to surrounding organs/tissues (i.e. bowel/bladder/ureters), need for additional procedures, wound complications, hospital re-admission, and conversion to open surgery - We discussed postop restrictions, precautions and expectations. We discussed typical hospital course and stay.  - She is RH  negative s/p Rhogam on 2/8 - She would like: Surgical management    Milas Hock, MD, FACOG Obstetrician & Gynecologist, [facility] for [facility], [facility]

## 2023-11-23 NOTE — Op Note (Signed)
Preop Diagnosis: Missed abortion Postop Diagnosis: Same Procedure: Dilation and evacuation Surgeon: Dr. Para March Assist: None Anesthesia: LMA  EBL: 25 cc IVF: 700 cc  UOP: Not drained Complications: None  Findings: Normal retroverted, uterus, products of conception noted  Description of the procedure: Preop antibiotics of doxycycline given. Informed consent reviewed and signed. Pt given opportunity to ask questions.   Pt prepped and draped in the dorsal lithotomy fashion after LMA anesthesia found to be adequate. Timeout performed.   Open-sided speculum placed into the vagina. Cervix grasped with a single tooth tenaculum. Cervix progressively dilated to a 25 pratt.  I used a 8 mm rigid curette. Several passes done with the suction curette and the tissue was retrieved. A gentle pass was done with a 1 curette. A gritty texture noted in all areas of the uterus confirming complete evacuation. Minimal bleeding noted. Procedure completed. All instruments removed. Counts correct x2.   Products of conception sent to pathology. Declines genetics. Rh negative. She is s/p Rhogam.   Pt taken to recovery room in stable condition.  Milas Hock, MD Attending Obstetrician & Gynecologist, [facility] for [facility], [facility]

## 2023-11-23 NOTE — Anesthesia Preprocedure Evaluation (Addendum)
Anesthesia Evaluation  Patient identified by MRN, date of birth, ID band Patient awake    Reviewed: Allergy & Precautions, NPO status , Patient's Chart, lab work & pertinent test results  Airway Mallampati: II  TM Distance: >3 FB Neck ROM: Full    Dental no notable dental hx. (+) Dental Advisory Given, Teeth Intact   Pulmonary neg pulmonary ROS   Pulmonary exam normal breath sounds clear to auscultation       Cardiovascular negative cardio ROS Normal cardiovascular exam Rhythm:Regular Rate:Normal     Neuro/Psych  PSYCHIATRIC DISORDERS Anxiety Depression    negative neurological ROS     GI/Hepatic negative GI ROS, Neg liver ROS,,,  Endo/Other  negative endocrine ROS    Renal/GU negative Renal ROS  negative genitourinary   Musculoskeletal negative musculoskeletal ROS (+)    Abdominal  (+) + obese  Peds  Hematology  (+) Blood dyscrasia, anemia   Anesthesia Other Findings Retained POC  Reproductive/Obstetrics                              Anesthesia Physical Anesthesia Plan  ASA: 2  Anesthesia Plan: General   Post-op Pain Management: Tylenol PO (pre-op)*   Induction: Intravenous  PONV Risk Score and Plan: Ondansetron, Dexamethasone and Midazolam  Airway Management Planned: LMA  Additional Equipment:   Intra-op Plan:   Post-operative Plan: Extubation in OR  Informed Consent: I have reviewed the patients History and Physical, chart, labs and discussed the procedure including the risks, benefits and alternatives for the proposed anesthesia with the patient or authorized representative who has indicated his/her understanding and acceptance.     Dental advisory given  Plan Discussed with: CRNA  Anesthesia Plan Comments:         Anesthesia Quick Evaluation

## 2023-11-23 NOTE — Transfer of Care (Signed)
Immediate Anesthesia Transfer of Care Note  Patient: [name]  Procedure(s) Performed: DILATATION AND EVACUATION (Uterus)  Patient Location: PACU  Anesthesia Type:General  Level of Consciousness: awake, alert , and oriented  Airway & Oxygen Therapy: Patient Spontanous Breathing and Patient connected to face mask oxygen  Post-op Assessment: Report given to RN and Post -op Vital signs reviewed and stable  Post vital signs: Reviewed and stable  Last Vitals:  Vitals Value Taken Time  BP 124/91 11/23/23 1422  Temp    Pulse 87 11/23/23 1425  Resp 9 11/23/23 1425  SpO2 100 % 11/23/23 1425  Vitals shown include unfiled device data.  Last Pain:  Vitals:   11/23/23 1230  PainSc: 0-No pain         Complications: No notable events documented.

## 2023-11-24 ENCOUNTER — Encounter: Payer: Self-pay | Admitting: Obstetrics and Gynecology

## 2023-11-24 ENCOUNTER — Encounter (HOSPITAL_COMMUNITY): Payer: Self-pay | Admitting: Obstetrics and Gynecology

## 2023-11-24 LAB — SURGICAL PATHOLOGY

## 2023-11-24 NOTE — Anesthesia Postprocedure Evaluation (Signed)
Anesthesia Post Note  Patient: Morgan Quinn  Procedure(s) Performed: DILATATION AND EVACUATION (Uterus)     Patient location during evaluation: PACU Anesthesia Type: General Level of consciousness: sedated and patient cooperative Pain management: pain level controlled Vital Signs Assessment: post-procedure vital signs reviewed and stable Respiratory status: spontaneous breathing Cardiovascular status: stable Anesthetic complications: no   There were no known notable events for this encounter.  Last Vitals:  Vitals:   11/23/23 1530 11/23/23 1545  BP: 113/80 116/82  Pulse: 60 (!) 58  Resp: 16 10  Temp:  37.2 C  SpO2: 97% 99%    Last Pain:  Vitals:   11/23/23 1545  PainSc: 0-No pain                 Lewie Loron
